# Patient Record
Sex: Female | Born: 1985 | Race: White | Hispanic: No | Marital: Married | Smoking: Former smoker
Health system: Southern US, Community
[De-identification: ages and names within clinical notes are randomized; demographics above are authoritative.]

## PROBLEM LIST (undated history)

## (undated) DIAGNOSIS — R011 Cardiac murmur, unspecified: Secondary | ICD-10-CM

## (undated) DIAGNOSIS — T8859XA Other complications of anesthesia, initial encounter: Secondary | ICD-10-CM

## (undated) DIAGNOSIS — T4145XA Adverse effect of unspecified anesthetic, initial encounter: Secondary | ICD-10-CM

## (undated) DIAGNOSIS — I4891 Unspecified atrial fibrillation: Secondary | ICD-10-CM

## (undated) DIAGNOSIS — I341 Nonrheumatic mitral (valve) prolapse: Secondary | ICD-10-CM

## (undated) DIAGNOSIS — Z5189 Encounter for other specified aftercare: Secondary | ICD-10-CM

## (undated) DIAGNOSIS — I4729 Other ventricular tachycardia: Secondary | ICD-10-CM

## (undated) DIAGNOSIS — G90A Postural orthostatic tachycardia syndrome (POTS): Secondary | ICD-10-CM

## (undated) DIAGNOSIS — Z9889 Other specified postprocedural states: Secondary | ICD-10-CM

## (undated) DIAGNOSIS — I472 Ventricular tachycardia: Secondary | ICD-10-CM

## (undated) DIAGNOSIS — R51 Headache: Secondary | ICD-10-CM

## (undated) DIAGNOSIS — R112 Nausea with vomiting, unspecified: Secondary | ICD-10-CM

## (undated) DIAGNOSIS — G901 Familial dysautonomia [Riley-Day]: Secondary | ICD-10-CM

## (undated) DIAGNOSIS — Z8489 Family history of other specified conditions: Secondary | ICD-10-CM

## (undated) HISTORY — DX: Other ventricular tachycardia: I47.29

## (undated) HISTORY — PX: ABDOMINAL HYSTERECTOMY: SHX81

## (undated) HISTORY — DX: Familial dysautonomia (riley-day): G90.1

## (undated) HISTORY — DX: Nonrheumatic mitral (valve) prolapse: I34.1

## (undated) HISTORY — PX: MYRINGOTOMY: SUR874

## (undated) HISTORY — DX: Ventricular tachycardia: I47.2

---
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
## 2003-01-10 ENCOUNTER — Emergency Department (HOSPITAL_COMMUNITY): Admission: EM | Admit: 2003-01-10 | Discharge: 2003-01-10 | Payer: Self-pay | Admitting: Emergency Medicine

## 2004-09-12 ENCOUNTER — Emergency Department (HOSPITAL_COMMUNITY): Admission: EM | Admit: 2004-09-12 | Discharge: 2004-09-12 | Payer: Self-pay | Admitting: Emergency Medicine

## 2004-10-14 ENCOUNTER — Ambulatory Visit (HOSPITAL_COMMUNITY): Admission: RE | Admit: 2004-10-14 | Discharge: 2004-10-14 | Payer: Self-pay | Admitting: Preventative Medicine

## 2006-07-19 HISTORY — PX: CERVICAL CERCLAGE: SHX1329

## 2006-11-28 ENCOUNTER — Emergency Department (HOSPITAL_COMMUNITY): Admission: EM | Admit: 2006-11-28 | Discharge: 2006-11-28 | Payer: Self-pay | Admitting: Emergency Medicine
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
## 2007-01-26 ENCOUNTER — Ambulatory Visit (HOSPITAL_COMMUNITY): Admission: RE | Admit: 2007-01-26 | Discharge: 2007-01-26 | Payer: Self-pay | Admitting: Obstetrics and Gynecology

## 2007-01-27 ENCOUNTER — Ambulatory Visit (HOSPITAL_COMMUNITY): Admission: RE | Admit: 2007-01-27 | Discharge: 2007-01-27 | Payer: Self-pay | Admitting: Obstetrics and Gynecology

## 2007-02-05 ENCOUNTER — Ambulatory Visit: Payer: Self-pay | Admitting: *Deleted

## 2007-02-05 ENCOUNTER — Inpatient Hospital Stay (HOSPITAL_COMMUNITY): Admission: AD | Admit: 2007-02-05 | Discharge: 2007-02-05 | Payer: Self-pay | Admitting: Obstetrics and Gynecology

## 2007-04-03 ENCOUNTER — Inpatient Hospital Stay (HOSPITAL_COMMUNITY): Admission: AD | Admit: 2007-04-03 | Discharge: 2007-04-03 | Payer: Self-pay | Admitting: Gynecology

## 2007-04-03 ENCOUNTER — Ambulatory Visit: Payer: Self-pay | Admitting: *Deleted

## 2007-04-30 ENCOUNTER — Inpatient Hospital Stay (HOSPITAL_COMMUNITY): Admission: AD | Admit: 2007-04-30 | Discharge: 2007-05-03 | Payer: Self-pay | Admitting: Obstetrics & Gynecology

## 2007-04-30 ENCOUNTER — Ambulatory Visit: Payer: Self-pay | Admitting: *Deleted

## 2008-07-01 ENCOUNTER — Other Ambulatory Visit: Admission: RE | Admit: 2008-07-01 | Discharge: 2008-07-01 | Payer: Self-pay | Admitting: Obstetrics & Gynecology

## 2009-11-04 ENCOUNTER — Other Ambulatory Visit: Admission: RE | Admit: 2009-11-04 | Discharge: 2009-11-04 | Payer: Self-pay | Admitting: Obstetrics & Gynecology

## 2010-09-23 ENCOUNTER — Ambulatory Visit (INDEPENDENT_AMBULATORY_CARE_PROVIDER_SITE_OTHER): Payer: Medicaid Other | Admitting: Cardiovascular Disease

## 2010-09-23 ENCOUNTER — Ambulatory Visit: Payer: Self-pay | Admitting: Cardiovascular Disease

## 2010-09-23 ENCOUNTER — Encounter: Payer: Self-pay | Admitting: Cardiovascular Disease

## 2010-09-23 DIAGNOSIS — I059 Rheumatic mitral valve disease, unspecified: Secondary | ICD-10-CM

## 2010-09-23 DIAGNOSIS — R002 Palpitations: Secondary | ICD-10-CM | POA: Insufficient documentation

## 2010-09-24 ENCOUNTER — Encounter: Payer: Self-pay | Admitting: Cardiovascular Disease

## 2010-09-29 ENCOUNTER — Telehealth: Payer: Self-pay

## 2010-09-29 ENCOUNTER — Ambulatory Visit: Payer: Self-pay | Admitting: Cardiology

## 2010-09-29 NOTE — Letter (Signed)
Summary: dr Janna Arch records  dr Janna Arch records   Imported By: Faythe Ghee 09/24/2010 11:36:39  _____________________________________________________________________  External Attachment:    Type:   Image     Comment:   External Document

## 2010-09-29 NOTE — Assessment & Plan Note (Signed)
Summary: ***NP6 MYTRAL VALUE PROLAPSE/ DONDIEGO REC ON FILE 09/11/10/TMJ   Visit Type:  Initial Consult Primary Provider:  Dr.Dondiego  CC:  mitro valve prolapse per Dondiego.  History of Present Illness: Tara Mahoney is seen today at the request of Dr Janna Arch She has had stypical SSCP, dyspnea and ? MVP since her daughter was born 3 years ago.  Pain is sharp in her chest.  Can be exertinal like going to mail box or at rest.  Reviewed echo report from 08/17/10  Redundant anterior leaflet with mild eccentric MR.  Personally spoke with Dr Janna Arch to get copy of study.  He indicated that pictures were on VHS tape.   No previous history of heart problems. Has a brother who has bad hypertension and " a hole in his heart" She has a 27 yo  at home and is able to keep up with her.  Reviewed ECG from today and it is normal.  No hisotry of chronic lung disease, asthma.  Dont have any recent thyroid studies.    Current Problems (verified): None  Current Medications (verified): 1)  Ortho Evra 150-20 Mcg/24hr Ptwk (Norelgestromin-Eth Estradiol) .... Apply 1 Patch Per 3 Weeks 2)  Metoprolol Tartrate 25 Mg Tabs (Metoprolol Tartrate) .... Take 1 Tab Two Times A Day  Allergies (verified): 1)  ! Vicodin  Family History: Brother with hole in heart and HTN  Social History: Married  one daughter Tobacco Use - Yes.  Alcohol Use - no Regular Exercise - no Drug Use - no  Review of Systems       Denies fever, malais, weight loss, blurry vision, decreased visual acuity, cough, sputum, hemoptysis, pleuritic pain, , heartburn, abdominal pain, melena, lower extremity edema, claudication, or rash.   Vital Signs:  Patient profile:   25 year old female Height:      63 inches Weight:      118 pounds BMI:     20.98 Pulse rate:   77 / minute BP sitting:   114 / 74  (left arm)  Vitals Entered By: Dreama Saa, CNA (September 23, 2010 2:50 PM)  Physical Exam  General:  Affect appropriate Healthy:  appears  stated age HEENT: normal Neck supple with no adenopathy JVP normal no bruits no thyromegaly Lungs clear with no wheezing and good diaphragmatic motion Heart:  S1/S2 no murmur,rub, gallop or click PMI normal Abdomen: benighn, BS positve, no tenderness, no AAA no bruit.  No HSM or HJR Distal pulses intact with no bruits No edema Neuro non-focal Skin warm and dry    Impression & Recommendations:  Problem # 1:  MITRAL VALVE PROLAPSE (ICD-424.0) No murmur on exam Doubt true barlows syndrome.  Have asked for VHS tape to review Her updated medication list for this problem includes:    Metoprolol Tartrate 25 Mg Tabs (Metoprolol tartrate) .Marland Kitchen... Take 1 tab two times a day  Problem # 2:  DYSPNEA (ICD-786.05) Functional no evidence of cardiopulm disease Her updated medication list for this problem includes:    Metoprolol Tartrate 25 Mg Tabs (Metoprolol tartrate) .Marland Kitchen... Take 1 tab two times a day  Problem # 3:  PALPITATIONS (ICD-785.1) Benign.  Can consider event monitor if they worsen.   Her updated medication list for this problem includes:    Metoprolol Tartrate 25 Mg Tabs (Metoprolol tartrate) .Marland Kitchen... Take 1 tab two times a day   EKG Report  Procedure date:  09/23/2010  Findings:      NSR 69 Normal ECG

## 2010-10-02 ENCOUNTER — Encounter: Payer: Self-pay | Admitting: Adult Health

## 2010-10-02 ENCOUNTER — Ambulatory Visit (INDEPENDENT_AMBULATORY_CARE_PROVIDER_SITE_OTHER): Payer: Medicaid Other | Admitting: Adult Health

## 2010-10-02 DIAGNOSIS — R0989 Other specified symptoms and signs involving the circulatory and respiratory systems: Secondary | ICD-10-CM

## 2010-10-02 DIAGNOSIS — R002 Palpitations: Secondary | ICD-10-CM

## 2010-10-05 ENCOUNTER — Encounter: Payer: Self-pay | Admitting: Cardiovascular Disease

## 2010-10-06 NOTE — Letter (Signed)
Summary: Stress Echocardiogram Information Sheet  [facility]  [address], [place] [address]   Phone: [phone]  Fax: [phone]      October 02, 2010 MRN: [id number] light prior to the test.   Corliss Marcus  Doctor: Appointment Date: Appointment Time: Appointment Location: [facility]  Stress Echocardiogram Information Sheet    Instructions:   1. DO NOT  take your Metoprolol the day before or morning of test.  2. Do not eat or drink after midnight the night before test.  3. Dress prepared to exercise.  4. DO NOT use ANY caffine or tobacco products 3 hours before appointment.  5. Report to the Short Stay Center on the1st floor.  6. Please bring all current prescription medications.  7. If you have any questions, please call [phone]

## 2010-10-06 NOTE — Progress Notes (Addendum)
**Note De-Identified Tara Mahoney Obfuscation** Summary: Echo pictures  Phone Note Call from Patient   Caller: Patient Reason for Call: Talk to Nurse Summary of Call: patient wanting to know if we received "pictures" from DonDiego of Echo / we have not received pictures / patient wanted to know "if there was anything wrong with her" / tg Initial call taken by: [name] [name],  September 29, 2010 1:38 PM  Follow-up for Phone Call        Pt. advised that we are waiting on pictures from her recent Echo from [name] office and we will contact her once [name] reviews pictures.  Follow-up by: [name] LPN,  September 29, 2010 2:09 PM  Additional Follow-up for Phone Call Additional follow up Details #1::        Pt. states that after talking with me, she called [name] office to request that pictures be sent to [place] and was told that her pictures were on a tape with a lot of other patient's Echo results and that it would be too difficult to just get her pictures without disturbing the others. Per pt. request, f/u appt. scheduled with [name], NP on 3-16. Additional Follow-up by: [name] LPN,  September 29, 2010 2:32 PM     Appended Document: Echo pictures She can have a F/U echo in a year.  No murmur on exam  Doubt MVP

## 2010-10-06 NOTE — Progress Notes (Signed)
**Note De-Identified Esparanza Krider Obfuscation** Summary: "pictures" from echo  Phone Note Outgoing Call   Call placed by: [name] Galaxy Borden LPN,  September 29, 2010 2:11 PM Summary of Call: Call placed to [name] office with second request for pictures of pt's Echo.  Initial call taken by: [name] Chamar Broughton LPN,  September 29, 2010 2:11 PM

## 2010-10-06 NOTE — Assessment & Plan Note (Signed)
Summary: per [name] for f/u/tg   Visit Type:  Follow-up Primary Provider:  Dr.Dondiego  CC:  .Marland Kitchen  History of Present Illness: Tara Mahoney is a very pleasant 25 y/o CF we are seeing on follow up after originally seen by [name] one week ago because of abnormal echo done by Dr. Janna Arch in his office demonstrating a severe mitral valve prolapse.  We requested the images, but still have not received them.  Tara Mahoney continues to have DOE, palpatations and occasional sharp chest discomfort which is fleeting located under her left breast. She has become somewhat anxious about this and was to talk to [name] about the echo results on this visit.  Unfortunately, [name] is not available today.  On last visit, [name] discussed the need to repeat the echo if the images could not be obtained.  Current Medications (verified): 1)  Ortho Evra 150-20 Mcg/24hr Ptwk (Norelgestromin-Eth Estradiol) .... Apply 1 Patch Per 3 Weeks 2)  Metoprolol Tartrate 25 Mg Tabs (Metoprolol Tartrate) .... Take 1 Tab Two Times A Day  Allergies (verified): 1)  ! Vicodin  Comments:  Nurse/Medical Assistant: patient states meds are the same pharmacy is [place] [facility]  Past History:  Past medical, surgical, family and social histories (including risk factors) reviewed, and no changes noted (except as noted below).  Past Medical History: Reviewed history from 09/22/2010 and no changes required. palpatations mitro valve prolapse  Past Surgical History: Reviewed history from 09/22/2010 and no changes required. childbirth  Family History: Reviewed history from 09/23/2010 and no changes required. Brother with hole in heart and HTN  Social History: Reviewed history from 09/23/2010 and no changes required. Married  one daughter Tobacco Use - Yes.  Alcohol Use - no Regular Exercise - no Drug Use - no  Review of Systems       All other systems have been reviewed and are negative unless stated  above.   Vital Signs:  Patient profile:   25 year old female Weight:      118 pounds BMI:     20.98 Pulse rate:   67 / minute BP sitting:   109 / 69  (left arm)  Vitals Entered By: Dreama Saa, CNA (October 02, 2010 11:56 AM)  Physical Exam  General:  Well developed, well nourished, in no acute distress. Lungs:  Clear bilaterally to auscultation and percussion. Heart:  Non-displaced PMI, chest non-tender; regular rate and rhythm, S1, S2 without murmurs, rubs or gallops. Carotid upstroke normal, no bruit. Normal abdominal aortic size, no bruits. Femorals normal pulses, no bruits. Pedals normal pulses. No edema, no varicosities. Abdomen:  Bowel sounds positive; abdomen soft and non-tender without masses, organomegaly, or hernias noted. No hepatosplenomegaly. Msk:  Back normal, normal gait. Muscle strength and tone normal. Pulses:  pulses normal in all 4 extremities Extremities:  No clubbing or cyanosis. Neurologic:  Alert and oriented x 3. Psych:  Normal affect.   Impression & Recommendations:  Problem # 1:  MITRAL VALVE PROLAPSE (ICD-424.0) We have no clear objective findings for this.  Auscultation did not prove to be positive for any mitral murmur.  She continues symptomatic.  I will schedule a stress echo to evaluate her.  She will follow-up with test results. In the interim she is to continue her metoprolol.  Once we have definitive answers we will make further recommendations. Her updated medication list for this problem includes:    Metoprolol Tartrate 25 Mg Tabs (Metoprolol tartrate) .Marland Kitchen... Take 1 tab two times  a day  Orders: Stress Echo (Stress Echo)  Problem # 2:  PALPITATIONS (ICD-785.1) Can consider cardionet monitor at some point. Her updated medication list for this problem includes:    Metoprolol Tartrate 25 Mg Tabs (Metoprolol tartrate) .Marland Kitchen... Take 1 tab two times a day  Orders: Stress Echo (Stress Echo)  Patient Instructions: 1)  Your physician recommends  that you schedule a follow-up appointment in: after test 2)  Your physician recommends that you continue on your current medications as directed. Please refer to the Current Medication list given to you today. 3)  Your physician has requested that you have an exercise stress myoview.  For further information please visit [email or web address]  Please follow instruction sheet, as given.

## 2010-10-14 ENCOUNTER — Ambulatory Visit (HOSPITAL_COMMUNITY): Payer: Medicaid Other | Attending: Cardiology

## 2010-10-14 ENCOUNTER — Ambulatory Visit (HOSPITAL_COMMUNITY)
Admission: RE | Admit: 2010-10-14 | Discharge: 2010-10-14 | Disposition: A | Discharge status: Home / Self Care | Payer: Medicaid Other | Source: Ambulatory Visit | Attending: Cardiology | Admitting: Cardiology

## 2010-10-14 DIAGNOSIS — R002 Palpitations: Secondary | ICD-10-CM | POA: Insufficient documentation

## 2010-10-14 DIAGNOSIS — I059 Rheumatic mitral valve disease, unspecified: Secondary | ICD-10-CM

## 2010-10-15 NOTE — Letter (Signed)
Summary: DR [facility] RECORDS  DR [facility] RECORDS   Imported By: Faythe Ghee 10/05/2010 10:31:50  _____________________________________________________________________  External Attachment:    Type:   Image     Comment:   External Document

## 2010-10-16 ENCOUNTER — Encounter (INDEPENDENT_AMBULATORY_CARE_PROVIDER_SITE_OTHER): Payer: Medicaid Other | Admitting: Adult Health

## 2010-10-16 ENCOUNTER — Encounter: Payer: Self-pay | Admitting: Adult Health

## 2010-10-19 ENCOUNTER — Ambulatory Visit (HOSPITAL_COMMUNITY)
Admission: RE | Admit: 2010-10-19 | Discharge: 2010-10-19 | Disposition: A | Discharge status: Home / Self Care | Payer: Medicaid Other | Source: Ambulatory Visit | Attending: Cardiology | Admitting: Cardiology

## 2010-10-19 DIAGNOSIS — R002 Palpitations: Secondary | ICD-10-CM | POA: Insufficient documentation

## 2010-10-19 DIAGNOSIS — I059 Rheumatic mitral valve disease, unspecified: Secondary | ICD-10-CM

## 2010-12-01 NOTE — Op Note (Signed)
NAME:  Tara Mahoney, Tara Mahoney               ACCOUNT NO.:  [id number]   MEDICAL RECORD NO.:  [id number]          PATIENT TYPE:  AMB   LOCATION:  DAY                           FACILITY:  [facility]   PHYSICIAN:  Tilda Burrow, M.D. DATE OF BIRTH:  [birth year]   DATE OF PROCEDURE:  DATE OF DISCHARGE:                               OPERATIVE REPORT   PREOPERATIVE DIAGNOSIS:  Pregnancy at 23-1/2 weeks, short cervical  length, suspect cervical incompetency.   POSTOPERATIVE DIAGNOSIS:  Pregnancy at 23-1/2 weeks, short cervical  length, suspect cervical incompetency, possible cervicitis.   OPERATION PERFORMED:  McDonald's cerclage placement with Prolene  sutures.   SURGEON:  Tilda Burrow, M.D.   ASSISTANT:  None.   ANESTHESIA:  Spinal.   COMPLICATIONS:  Incomplete analgesic relief where part of the cervix was  uncomfortable during stitch placement.   DESCRIPTION OF PROCEDURE:  The patient was taken to the operating room,  prepped and draped for vaginal procedure.  The spinal introduced by Dr.  Marina Goodell was allowed to settle down to the sacral dermatome and she was  allowed to sit for four minutes.  We then placed her in supine position  and she was able to lift her legs but had anal incontinence.  Speculum  was inserted and the cervix visualized.  Cervix could be carefully  grasped with an Allis clamp while a weighted speculum held the introitus  open and a lateral Deaver retractor allowed visualization of the cervix.  0-0 Prolene suture was used in a circumferential fashion at the base of  a fairly long appearing cervix where it entered into the vaginal apex.  The suture was placed carefully and very superficially trying to stay as  close as we could to the vaginal apex itself.  This was placed in  circumferential fashion and was complicated only by the fact that the  cervix bled vigorously if you touched it.  The cervix appeared to have  some irritation of the everted portions of the cervix  which were quite  large, and it was also complicated by the fact that the patient felt the  stitch being placed from 9 o'clock to 12 o'clock as we worked our way  clockwise around the cervix.  Suture was tied down with good firm  support of the cervix so that the cervix was less than 1 cm in diameter  at that point.  The cervix internal os was less than 1 cm at that point.  The suture was tied down.  At no time during the procedure was there any  question  of membrane rupture or damage to the pregnancy.  Postprocedure the  patient went to recovery room, fetal heart tones were normal in the  recovery room.  She will go home on Metrogel and a week's worth of  terbutaline.  Follow-up in our office.  Blood type is Rh positive.      Tilda Burrow, M.D.  Electronically Signed     JVF/MEDQ  D:  01/30/2007  T:  01/31/2007  Job:  [id number]

## 2010-12-01 NOTE — H&P (Signed)
NAME:  Tara Mahoney, Tara Mahoney               ACCOUNT NO.:  [id number]   MEDICAL RECORD NO.:  [id number]          PATIENT TYPE:  AMB   LOCATION:  DAY                           FACILITY:  [facility]   PHYSICIAN:  Tilda Burrow, M.D. DATE OF BIRTH:  21-Aug-1985   DATE OF ADMISSION:  DATE OF DISCHARGE:  LH                              HISTORY & PHYSICAL   ADMISSION DIAGNOSIS:  Pregnancy at 23-1/[redacted] weeks gestation, short  cervical length, suspected cervical incompetency.   HISTORY OF PRESENT ILLNESS:  This 25 year old female, gravida 1, para 0,  abortus 0 has been seen in our office twice this week for complaints of  lower abdominal pressure and discomfort.  She had what felt like a  bulging lower uterine segment and has been sent over for ultrasound  which shows cervical length of 2.1 cm, possibly less.  On transvaginal  ultrasound she has had a fetal fibronectin which was negative.  But  after counseling of the risks of McDonald cerclage versus concerns over  cervical length, the patient decided to proceed with late rescue  cerclage placement.  Risks of the procedure including membrane rupture  have been reviewed with the patient.  She understands that there are  indeed technical risks of membrane rupture associated with the case.  Husband Tara Mahoney was with her for this discussion.   PAST MEDICAL HISTORY:  Benign.   PAST SURGICAL HISTORY:  Foot surgery for Morton's neuroma in the past.  Grommets were placed in the ears as a child.   ALLERGIES:  CODEINE causing nausea.   SOCIAL HISTORY:  Cigarettes, alcohol, and recreational drugs denied.   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   PHYSICAL EXAMINATION:  GENERAL:  A petite, slim-built, Caucasian female  gravida 1, para 0.  VITAL SIGNS:  Weight 127, blood pressure 112/58.  HEENT:  Pupils equal, round, and reactive to light.  NECK:  Supple.  CHEST:  Clear to auscultation.  ABDOMEN:  Nontender.  Fundal height consistent with [redacted] weeks  gestation,  147 beats per minute heart rate.  PELVIC:  Cervix closed visually, spots with cervical contact with  speculum.  Midposition, but visually short.  Digital examination  deferred at this time based on yesterday's ultrasound.  Blood type is A  positive.   LABORATORY DATA:  Hemoglobin 14, hematocrit 40, hepatitis, HIV, RPR, GC,  and chlamydia all negative.  Pap smear normal.  Past gonorrhea and  Chlamydia cultures have been performed and are negative.   PLAN:  McDonald cerclage this p.m.      Tilda Burrow, M.D.  Electronically Signed     JVF/MEDQ  D:  01/27/2007  T:  01/27/2007  Job:  [id number]

## 2010-12-11 ENCOUNTER — Other Ambulatory Visit (HOSPITAL_COMMUNITY)
Admission: RE | Admit: 2010-12-11 | Discharge: 2010-12-11 | Disposition: A | Discharge status: Home / Self Care | Payer: Medicaid Other | Source: Ambulatory Visit | Attending: Obstetrics & Gynecology | Admitting: Obstetrics & Gynecology

## 2010-12-11 ENCOUNTER — Other Ambulatory Visit: Payer: Self-pay | Admitting: Obstetrics & Gynecology

## 2010-12-11 DIAGNOSIS — Z01419 Encounter for gynecological examination (general) (routine) without abnormal findings: Secondary | ICD-10-CM | POA: Insufficient documentation

## 2011-04-29 LAB — CBC
Platelets: 120 — ABNORMAL LOW
Platelets: 122 — ABNORMAL LOW
RDW: 14.5 — ABNORMAL HIGH
RDW: 14.5 — ABNORMAL HIGH
WBC: 12.9 — ABNORMAL HIGH

## 2011-04-29 LAB — WET PREP, GENITAL
Clue Cells Wet Prep HPF POC: NONE SEEN
Clue Cells Wet Prep HPF POC: NONE SEEN
Trich, Wet Prep: NONE SEEN
Yeast Wet Prep HPF POC: NONE SEEN
Yeast Wet Prep HPF POC: NONE SEEN

## 2011-04-29 LAB — STREP B DNA PROBE

## 2011-04-29 LAB — HEMOGLOBIN AND HEMATOCRIT, BLOOD
HCT: 37.7
Hemoglobin: 13.4

## 2011-05-03 LAB — URINALYSIS, ROUTINE W REFLEX MICROSCOPIC
Ketones, ur: 15 — AB
Nitrite: NEGATIVE
Specific Gravity, Urine: 1.02
pH: 6.5

## 2011-05-03 LAB — WET PREP, GENITAL: Clue Cells Wet Prep HPF POC: NONE SEEN

## 2011-05-03 LAB — URINE MICROSCOPIC-ADD ON

## 2011-05-03 LAB — GC/CHLAMYDIA PROBE AMP, GENITAL: Chlamydia, DNA Probe: NEGATIVE

## 2011-05-04 LAB — CBC
Hemoglobin: 12.7
Platelets: 140 — ABNORMAL LOW
RDW: 14.4 — ABNORMAL HIGH

## 2011-05-04 LAB — PROTIME-INR: INR: 0.9

## 2011-05-04 LAB — APTT: aPTT: 25

## 2011-07-06 LAB — OB RESULTS CONSOLE GC/CHLAMYDIA: Chlamydia: NEGATIVE

## 2011-07-06 LAB — OB RESULTS CONSOLE ABO/RH

## 2011-07-06 LAB — OB RESULTS CONSOLE RPR: RPR: NONREACTIVE

## 2011-07-06 LAB — OB RESULTS CONSOLE ANTIBODY SCREEN: Antibody Screen: NEGATIVE

## 2011-07-06 LAB — OB RESULTS CONSOLE HEPATITIS B SURFACE ANTIGEN: Hepatitis B Surface Ag: NEGATIVE

## 2011-07-19 ENCOUNTER — Encounter: Payer: Self-pay | Admitting: Adult Health

## 2011-08-26 ENCOUNTER — Other Ambulatory Visit: Payer: Self-pay | Admitting: Obstetrics & Gynecology

## 2011-09-02 ENCOUNTER — Encounter (HOSPITAL_COMMUNITY)
Admission: RE | Admit: 2011-09-02 | Discharge: 2011-09-02 | Disposition: A | Discharge status: Home / Self Care | Payer: Medicaid Other | Source: Ambulatory Visit | Attending: Obstetrics & Gynecology | Admitting: Obstetrics & Gynecology

## 2011-09-02 ENCOUNTER — Encounter (HOSPITAL_COMMUNITY): Payer: Self-pay

## 2011-09-02 ENCOUNTER — Other Ambulatory Visit: Payer: Self-pay

## 2011-09-02 HISTORY — DX: Adverse effect of unspecified anesthetic, initial encounter: T41.45XA

## 2011-09-02 HISTORY — DX: Other complications of anesthesia, initial encounter: T88.59XA

## 2011-09-02 HISTORY — DX: Headache: R51

## 2011-09-02 LAB — COMPREHENSIVE METABOLIC PANEL
CO2: 29 mEq/L (ref 19–32)
Calcium: 11 mg/dL — ABNORMAL HIGH (ref 8.4–10.5)
Chloride: 102 mEq/L (ref 96–112)
Creatinine, Ser: 0.7 mg/dL (ref 0.50–1.10)
GFR calc Af Amer: >90 mL/min (ref >90)
GFR calc non Af Amer: >90 mL/min (ref >90)
Glucose, Bld: 80 mg/dL (ref 70–99)
Total Bilirubin: 0.3 mg/dL (ref 0.3–1.2)

## 2011-09-02 LAB — CBC
HCT: 33 % — ABNORMAL LOW (ref 36.0–46.0)
Hemoglobin: 11.9 g/dL — ABNORMAL LOW (ref 12.0–15.0)
MCH: 31.6 pg (ref 26.0–34.0)
MCV: 87.5 fL (ref 78.0–100.0)
RBC: 3.77 MIL/uL — ABNORMAL LOW (ref 3.87–5.11)

## 2011-09-02 LAB — SURGICAL PCR SCREEN: Staphylococcus aureus: POSITIVE — AB

## 2011-09-02 NOTE — Patient Instructions (Addendum)
[name]  09/02/2011   Your procedure is scheduled on:  Wednesday, 09/08/11  [facility] to [facility] [place] AM.  Call this number if you have problems the morning of surgery: [phone]   Remember:   Do not eat food:After Midnight.  May have clear liquids:until Midnight .  Clear liquids include soda, tea, black coffee, apple or grape juice, broth.  Take these medicines the morning of surgery with A SIP OF WATER: metoprolol   Do not wear jewelry, make-up or nail polish.  Do not wear lotions, powders, or perfumes. You may wear deodorant.  Do not shave 48 hours prior to surgery.  Do not bring valuables to the hospital.  Contacts, dentures or bridgework may not be worn into surgery.  Leave suitcase in the car. After surgery it may be brought to your room.  For patients admitted to the hospital, checkout time is 11:00 AM the day of discharge.   Patients discharged the day of surgery will not be allowed to drive home.  Name and phone number of your driver:driver  Special Instructions: CHG Shower Use Special Wash: 1/2 bottle night before surgery and 1/2 bottle morning of surgery.   Please read over the following fact sheets that you were given: Pain Booklet, MRSA Information, Surgical Site Infection Prevention, Anesthesia Post-op Instructions and Care and Recovery After Surgery    Cerclage of the Cervix Cerclage of the cervix is a surgical procedure for an incompetent cervix. An incompetent cervix is a weak cervix that opens up before labor begins. Cerclage of the cervix sews the cervix closed during pregnancy.  LET YOUR CAREGIVER KNOW ABOUT:   Allergies to foods or medications.   All over-the-counter, prescription, herbal, eye drops and cream medications you are using.   Taking illegal drugs or drinking an excessive amount of alcohol.   Any recent colds or infections.   Past problems with anesthetics or novocaine.   Past surgery.   History of blood clots or abnormal  bleeding problems.   Other medical or health problems.  RISKS AND COMPLICATIONS   Infection.   Bleeding.   Rupturing the amniotic sac (membranes).   Going into early labor and delivery.   Problems with the anesthesia.   Infection of the amniotic sac.  BEFORE THE PROCEDURE   Do not take aspirin.   Do not eat or drink anything 8 hours before the procedure.   Do not smoke.   If you are being admitted the same day as the procedure, arrive at the hospital at least 60 minutes before the surgery or as directed. During this time, you will sign the necessary forms and get prepared for the surgery.   A waiting area is available for family and friends.  PROCEDURE   You will be given an IV (intravenous) and medication to relax you.   You will be put to sleep with a general anesthetic.   A stitch will be placed in and around the cervix to tighten it and keep it closed.  AFTER THE PROCEDURE   You will go to a recovery room where you and the baby are monitored.   Once you are awake, stable, and taking fluids well, barring other problems, you will be allowed to return to your room.   You will usually stay in the hospital overnight.   You may get an injection of progesterone to prevent uterine contractions.   Have someone drive you home and stay with you for a day or two.  You may be given medications to take when you go home.  HOME CARE INSTRUCTIONS   Only take over-the-counter or prescriptions medicines for pain, discomfort or fever as directed by your caregiver.   Avoid physical activities and exercise until your caregiver says it is okay.   Resume your usual diet.   Do not douche.   Do not have sexual intercourse until your caregiver tells you it is OK.   Keep your follow up surgical and prenatal appointments with your caregiver.  SEEK MEDICAL CARE IF:   You have abnormal vaginal discharge.   You develop a rash.   You are having problems with your medications.     You become lightheaded or feel faint.  SEEK IMMEDIATE MEDICAL CARE IF:   You develop vaginal bleeding.   You are leaking fluid or have a gush of fluid from the vagina.   You develop a temperature of 102 F (38.9 C) or higher.   You pass out.   You have uterine contractions.   You feel the baby is not moving as much as usual or cannot feel the baby move.  Document Released: 06/17/2008 Document Revised: 03/17/2011 Document Reviewed: 06/17/2008 [facility] Patient Information 2012 [place], [place].

## 2011-09-07 NOTE — H&P (Signed)
Tara Mahoney is an 26 y.o. female with EDD 02/19/12 by 7 week 2 day sonogram now at16 weeks gestation for prophylactic cerclage placement.  She had significant cervical shortening with her last pregnancy an had cerclage placed as result.  Eventually she delivered shortly after it was removed with the last pregnancy at 36 weeks.     Past Medical History  Diagnosis Date  . MVP (mitral valve prolapse)   . Palpitations   . Complication of anesthesia   . Hyperlipidemia   . Headache     Past Surgical History  Procedure Date  . Cervical cerclage 2008  . Myringotomy     Family History  Problem Relation Age of Onset  . Hypertension Brother   . Heart disease Brother     hole in heart  . Anesthesia problems Neg Hx   . Hypotension Neg Hx   . Pseudochol deficiency Neg Hx   . Malignant hyperthermia Neg Hx     Social History:  reports that she quit smoking about 2 years ago. She has never used smokeless tobacco. She reports that she does not drink alcohol or use illicit drugs.  Allergies:  Allergies  Allergen Reactions  . Hydrocodone-Acetaminophen Itching and Nausea And Vomiting    No prescriptions prior to admission    ROS  Review of Systems  Constitutional: Negative for fever, chills, weight loss, malaise/fatigue and diaphoresis.  HENT: Negative for hearing loss, ear pain, nosebleeds, congestion, sore throat, neck pain, tinnitus and ear discharge.   Eyes: Negative for blurred vision, double vision, photophobia, pain, discharge and redness.  Respiratory: Negative for cough, hemoptysis, sputum production, shortness of breath, wheezing and stridor.   Cardiovascular: Negative for chest pain, palpitations, orthopnea, claudication, leg swelling and PND.  Gastrointestinal: Negative for abdominal pain. Negative for heartburn, nausea, vomiting, diarrhea, constipation, blood in stool and melena.  Genitourinary: Negative for dysuria, urgency, frequency, hematuria and flank pain.    Musculoskeletal: Negative for myalgias, back pain, joint pain and falls.  Skin: Negative for itching and rash.  Neurological: Negative for dizziness, tingling, tremors, sensory change, speech change, focal weakness, seizures, loss of consciousness, weakness and headaches.  Endo/Heme/Allergies: Negative for environmental allergies and polydipsia. Does not bruise/bleed easily.  Psychiatric/Behavioral: Negative for depression, suicidal ideas, hallucinations, memory loss and substance abuse. The patient is not nervous/anxious and does not have insomnia.       Physical Exam Physical Exam  Vitals reviewed. Constitutional: She is oriented to person, place, and time. She appears well-developed and well-nourished.  HENT:  Head: Normocephalic and atraumatic.  Right Ear: External ear normal.  Left Ear: External ear normal.  Nose: Nose normal.  Mouth/Throat: Oropharynx is clear and moist.  Eyes: Conjunctivae and EOM are normal. Pupils are equal, round, and reactive to light. Right eye exhibits no discharge. Left eye exhibits no discharge. No scleral icterus.  Neck: Normal range of motion. Neck supple. No tracheal deviation present. No thyromegaly present.  Cardiovascular: Normal rate, regular rhythm, normal heart sounds and intact distal pulses.  Exam reveals no gallop and no friction rub.   No murmur heard. Respiratory: Effort normal and breath sounds normal. No respiratory distress. She has no wheezes. She has no rales. She exhibits no tenderness.  GI: Soft. Bowel sounds are normal. She exhibits no distension and no mass. There is no tenderness. There is no rebound and no guarding.  Genitourinary:       Vulva is normal without lesions Vagina is pink moist without discharge Cervix normal in  appearance and pap is normal, firm not patulous Uterus is enlarged to 16 weeks size Adnexa is negative with normal sized ovaries by sonogram  Musculoskeletal: Normal range of motion. She exhibits no edema  and no tenderness.  Neurological: She is alert and oriented to person, place, and time. She has normal reflexes. She displays normal reflexes. No cranial nerve deficit. She exhibits normal muscle tone. Coordination normal.  Skin: Skin is warm and dry. No rash noted. No erythema. No pallor.  Psychiatric: She has a normal mood and affect. Her behavior is normal. Judgment and thought content normal.    GBS negative in office Recent Results (from the past 336 hour(s))  CBC   Collection Time   09/02/11 12:55 PM      Component Value Range   WBC 7.2  4.0 - 10.5 (K/uL)   RBC 3.77 (*) 3.87 - 5.11 (MIL/uL)   Hemoglobin 11.9 (*) 12.0 - 15.0 (g/dL)   HCT 16.1 (*) 09.6 - 46.0 (%)   MCV 87.5  78.0 - 100.0 (fL)   MCH 31.6  26.0 - 34.0 (pg)   MCHC 36.1 (*) 30.0 - 36.0 (g/dL)   RDW 04.5  40.9 - 81.1 (%)   Platelets 120 (*) 150 - 400 (K/uL)  COMPREHENSIVE METABOLIC PANEL   Collection Time   09/02/11 12:55 PM      Component Value Range   Sodium 139  135 - 145 (mEq/L)   Potassium 4.0  3.5 - 5.1 (mEq/L)   Chloride 102  96 - 112 (mEq/L)   CO2 29  19 - 32 (mEq/L)   Glucose, Bld 80  70 - 99 (mg/dL)   BUN 9  6 - 23 (mg/dL)   Creatinine, Ser 9.14  0.50 - 1.10 (mg/dL)   Calcium 78.2 (*) 8.4 - 10.5 (mg/dL)   Total Protein 6.4  6.0 - 8.3 (g/dL)   Albumin 3.3 (*) 3.5 - 5.2 (g/dL)   AST 15  0 - 37 (U/L)   ALT 10  0 - 35 (U/L)   Alkaline Phosphatase 43  39 - 117 (U/L)   Total Bilirubin 0.3  0.3 - 1.2 (mg/dL)   GFR calc non Af Amer >90  >90 (mL/min)   GFR calc Af Amer >90  >90 (mL/min)  SURGICAL PCR SCREEN   Collection Time   09/02/11  1:00 PM      Component Value Range   MRSA, PCR NEGATIVE  NEGATIVE    Staphylococcus aureus POSITIVE (*) NEGATIVE        Assessment/Plan: 1.  [redacted] weeks gestation 2.  Prophylactic cerclage placement due to dramatic cervical shortening in the 2nd trimester with her last pregnancy  Patient understands th risks and benefits of cerclage placement including preterm labor  and PROM.  She will proceed.  Kerby Hockley H 09/07/2011, 2:46 PM

## 2011-09-08 ENCOUNTER — Encounter (HOSPITAL_COMMUNITY): Payer: Self-pay | Admitting: Anesthesiology

## 2011-09-08 ENCOUNTER — Ambulatory Visit (HOSPITAL_COMMUNITY)
Admission: RE | Admit: 2011-09-08 | Discharge: 2011-09-08 | Disposition: A | Discharge status: Home / Self Care | Payer: Medicaid Other | Source: Ambulatory Visit | Attending: Obstetrics & Gynecology | Admitting: Obstetrics & Gynecology

## 2011-09-08 ENCOUNTER — Encounter (HOSPITAL_COMMUNITY)
Admission: RE | Disposition: A | Discharge status: Home / Self Care | Payer: Self-pay | Source: Ambulatory Visit | Attending: Obstetrics & Gynecology

## 2011-09-08 ENCOUNTER — Encounter (HOSPITAL_COMMUNITY): Payer: Self-pay | Admitting: *Deleted

## 2011-09-08 ENCOUNTER — Ambulatory Visit (HOSPITAL_COMMUNITY): Payer: Medicaid Other | Admitting: Anesthesiology

## 2011-09-08 DIAGNOSIS — O09219 Supervision of pregnancy with history of pre-term labor, unspecified trimester: Secondary | ICD-10-CM | POA: Insufficient documentation

## 2011-09-08 DIAGNOSIS — N883 Incompetence of cervix uteri: Secondary | ICD-10-CM | POA: Insufficient documentation

## 2011-09-08 HISTORY — PX: CERVICAL CERCLAGE: SHX1329

## 2011-09-08 HISTORY — DX: Cardiac murmur, unspecified: R01.1

## 2011-09-08 SURGERY — CERCLAGE, CERVIX, VAGINAL APPROACH
Anesthesia: Spinal | Site: Cervix | Wound class: Clean Contaminated

## 2011-09-08 MED ORDER — CEFAZOLIN SODIUM 1-5 GM-% IV SOLN
INTRAVENOUS | Status: AC
  Filled 2011-09-08: qty 50

## 2011-09-08 MED ORDER — FENTANYL CITRATE 0.05 MG/ML IJ SOLN
INTRAMUSCULAR | Status: AC
  Administered 2011-09-08: 25 ug via INTRAVENOUS
  Filled 2011-09-08: qty 2

## 2011-09-08 MED ORDER — LACTATED RINGERS IV SOLN
INTRAVENOUS | Status: DC
  Administered 2011-09-08: 1000 mL via INTRAVENOUS

## 2011-09-08 MED ORDER — MIDAZOLAM HCL 2 MG/2ML IJ SOLN
INTRAMUSCULAR | Status: AC
  Administered 2011-09-08: 2 mg via INTRAVENOUS
  Filled 2011-09-08: qty 2

## 2011-09-08 MED ORDER — FENTANYL CITRATE 0.05 MG/ML IJ SOLN
25.0000 ug | INTRAMUSCULAR | Status: DC | PRN
  Administered 2011-09-08: 25 ug via INTRAVENOUS
  Administered 2011-09-08: 50 ug via INTRAVENOUS
  Administered 2011-09-08: 25 ug via INTRAVENOUS

## 2011-09-08 MED ORDER — 0.9 % SODIUM CHLORIDE (POUR BTL) OPTIME
TOPICAL | Status: DC | PRN
  Administered 2011-09-08: 1000 mL

## 2011-09-08 MED ORDER — INDOMETHACIN 50 MG RE SUPP: 50.0000 mg | Freq: Once | RECTAL | Status: DC

## 2011-09-08 MED ORDER — MIDAZOLAM HCL 2 MG/2ML IJ SOLN
INTRAMUSCULAR | Status: AC
  Filled 2011-09-08: qty 2

## 2011-09-08 MED ORDER — INDOMETHACIN 50 MG PO CAPS
50.0000 mg | ORAL_CAPSULE | Freq: Once | ORAL | Status: AC
  Administered 2011-09-08: 50 mg via ORAL
  Filled 2011-09-08: qty 1

## 2011-09-08 MED ORDER — ONDANSETRON HCL 4 MG/2ML IJ SOLN
4.0000 mg | Freq: Once | INTRAMUSCULAR | Status: AC | PRN
  Administered 2011-09-08: 4 mg via INTRAVENOUS

## 2011-09-08 MED ORDER — OXYCODONE HCL 5 MG PO TABS: 5.0000 mg | ORAL_TABLET | ORAL | Status: AC | PRN

## 2011-09-08 MED ORDER — MIDAZOLAM HCL 2 MG/2ML IJ SOLN
1.0000 mg | INTRAMUSCULAR | Status: DC | PRN
  Administered 2011-09-08 (×2): 2 mg via INTRAVENOUS

## 2011-09-08 MED ORDER — ONDANSETRON HCL 4 MG/2ML IJ SOLN
INTRAMUSCULAR | Status: AC
  Administered 2011-09-08: 4 mg via INTRAVENOUS
  Filled 2011-09-08: qty 2

## 2011-09-08 MED ORDER — FERRIC SUBSULFATE 259 MG/GM EX SOLN
CUTANEOUS | Status: AC
  Filled 2011-09-08: qty 8

## 2011-09-08 MED ORDER — BUPIVACAINE IN DEXTROSE 0.75-8.25 % IT SOLN
INTRATHECAL | Status: DC | PRN
  Administered 2011-09-08: 11.25 mg via INTRATHECAL

## 2011-09-08 MED ORDER — FERRIC SUBSULFATE 259 MG/GM EX SOLN
CUTANEOUS | Status: DC | PRN
  Administered 2011-09-08: 1

## 2011-09-08 MED ORDER — CEFAZOLIN SODIUM 1-5 GM-% IV SOLN
1.0000 g | INTRAVENOUS | Status: AC
  Administered 2011-09-08: 1 g via INTRAVENOUS

## 2011-09-08 MED ORDER — BUPIVACAINE IN DEXTROSE 0.75-8.25 % IT SOLN
INTRATHECAL | Status: AC
  Filled 2011-09-08: qty 2

## 2011-09-08 MED ORDER — ONDANSETRON HCL 8 MG PO TABS: 8.0000 mg | ORAL_TABLET | Freq: Three times a day (TID) | ORAL | Status: AC | PRN

## 2011-09-08 MED ORDER — ONDANSETRON HCL 4 MG/2ML IJ SOLN
4.0000 mg | Freq: Once | INTRAMUSCULAR | Status: AC
  Administered 2011-09-08: 4 mg via INTRAVENOUS

## 2011-09-08 MED ORDER — ONDANSETRON HCL 4 MG/2ML IJ SOLN
INTRAMUSCULAR | Status: AC
  Filled 2011-09-08: qty 2

## 2011-09-08 SURGICAL SUPPLY — 24 items
BAG HAMPER (MISCELLANEOUS) ×2 IMPLANT
CLOTH BEACON ORANGE TIMEOUT ST (SAFETY) ×2 IMPLANT
COVER LIGHT HANDLE STERIS (MISCELLANEOUS) ×4 IMPLANT
GAUZE SPONGE 4X4 16PLY XRAY LF (GAUZE/BANDAGES/DRESSINGS) ×2 IMPLANT
GLOVE BIOGEL PI IND STRL 7.0 (GLOVE) ×2 IMPLANT
GLOVE BIOGEL PI IND STRL 8 (GLOVE) ×2 IMPLANT
GLOVE BIOGEL PI INDICATOR 7.0 (GLOVE) ×2
GLOVE BIOGEL PI INDICATOR 8 (GLOVE) ×2
GLOVE ECLIPSE 6.5 STRL STRAW (GLOVE) ×4 IMPLANT
GLOVE ECLIPSE 8.0 STRL XLNG CF (GLOVE) ×2 IMPLANT
GLOVE OPTIFIT SS 6.5 STRL BRWN (GLOVE) ×2 IMPLANT
GOWN STRL REIN XL XLG (GOWN DISPOSABLE) ×6 IMPLANT
KIT ROOM TURNOVER APOR (KITS) ×2 IMPLANT
MANIFOLD NEPTUNE II (INSTRUMENTS) ×2 IMPLANT
NS IRRIG 1000ML POUR BTL (IV SOLUTION) ×2 IMPLANT
PACK PERI GYN (CUSTOM PROCEDURE TRAY) ×2 IMPLANT
PAD ARMBOARD 7.5X6 YLW CONV (MISCELLANEOUS) ×2 IMPLANT
SET BASIN LINEN APH (SET/KITS/TRAYS/PACK) ×2 IMPLANT
SPONGE GAUZE 4X4 12PLY (GAUZE/BANDAGES/DRESSINGS) ×2 IMPLANT
SUT MERSILENE FIBER S 5 CTX 12 (SUTURE) ×4 IMPLANT
SUT PROLENE 1 CT 1 30 (SUTURE) IMPLANT
SUT SILK 2 0 SH (SUTURE) ×2 IMPLANT
TOWEL OR 17X26 4PK STRL BLUE (TOWEL DISPOSABLE) ×2 IMPLANT
TRAY FOLEY CATH 14FR (SET/KITS/TRAYS/PACK) ×2 IMPLANT

## 2011-09-08 NOTE — Op Note (Signed)
Preoperative diagnosis:  1.  Intrauterine pregnancy at [redacted] weeks gestation                                         2.  history of cervical incompetence with previous pregnancy  Postoperative diagnosis:  Same as above  Procedure:  Placement of a McDonald cerclage, using 5 mm Mersilene tape  Surgeon:  Rockne Coons MD  Anesthesia:  Spinal using Marcaine  Findings:  Tara Mahoney is [redacted] weeks pregnant with her second pregnancy.  She had a cerclage placed with her last pregnancy at approximately 20 weeks  due to significant cervical shortening.  Her cervix was about 1.5 cm at that point.  Additionally when her cerclage was removed at 36 weeks she delivered within the next 24 hours.  Since Tara Mahoney has not experienced a second trimester incompetent loss we discussed options including frequent cervical lengths and placement of cerclage if it fail below cm.  We also discussed prophylactic placement at length.  She and I decided upon prophylactic placement in the early second trimester.  Today intraoperatively her cervix is patulous soft mucousy and bleeds easily.  It does appear shortened at least to the level of the bladder reflection.  I was able to get a good 2-2-1/2 cm length circumferentially tied down to a tight fingertip.  Description of operation:  Patient was taken to the operating room where she underwent a spinal anesthetic in the sitting position.  She was then placed in the dorsal lithotomy position using candycane stirrups.  I then prepped and draped in the lower abdomen inner thighs perineum perirectal and vaginal area.  A Foley catheter was placed.  A medium length weighted speculum was placed posteriorly.  2 Deaver retractors were used anteriorly and laterally for retraction and visualization.  A 5 mm Mersilene tape was employed.  It was placed in a counterclockwise fashion at 5 points circumferentially about the cervix.  It was then tied down to a tight fingertip tension at 12:00.  To facilitate  future removal a 2-0 silk suture was placed above the knot and tied down.  The Mersilene tape and were then cut.  Because her cervix was  patulous it bled easily.  Pressure and Monsel solution were used to control the bleeding.  It was all from the external os.    The patient tolerated the procedure well.  She experienced approximately 25 cc of blood loss.  She was taken to the PACU in good stable condition with all counts being correct.  She received a gram of Ancef preoperatively prophylactically. She will be seen in the office next week for her postoperative visit  Tara Mahoney 09/08/2011 8:39 AM

## 2011-09-08 NOTE — Discharge Instructions (Signed)
Cerclage of the Cervix Cerclage of the cervix is a surgical procedure for an incompetent cervix. An incompetent cervix is a weak cervix that opens up before labor begins. Cerclage of the cervix sews the cervix closed during pregnancy.  LET YOUR CAREGIVER KNOW ABOUT:   Allergies to foods or medications.   All over-the-counter, prescription, herbal, eye drops and cream medications you are using.   Taking illegal drugs or drinking an excessive amount of alcohol.   Any recent colds or infections.   Past problems with anesthetics or novocaine.   Past surgery.   History of blood clots or abnormal bleeding problems.   Other medical or health problems.  RISKS AND COMPLICATIONS   Infection.   Bleeding.   Rupturing the amniotic sac (membranes).   Going into early labor and delivery.   Problems with the anesthesia.   Infection of the amniotic sac.  BEFORE THE PROCEDURE   Do not take aspirin.   Do not eat or drink anything 8 hours before the procedure.   Do not smoke.   If you are being admitted the same day as the procedure, arrive at the hospital at least 60 minutes before the surgery or as directed. During this time, you will sign the necessary forms and get prepared for the surgery.   A waiting area is available for family and friends.  PROCEDURE   You will be given an IV (intravenous) and medication to relax you.   You will be put to sleep with a general anesthetic.   A stitch will be placed in and around the cervix to tighten it and keep it closed.  AFTER THE PROCEDURE   You will go to a recovery room where you and the baby are monitored.   Once you are awake, stable, and taking fluids well, barring other problems, you will be allowed to return to your room.   You will usually stay in the hospital overnight.   You may get an injection of progesterone to prevent uterine contractions.   Have someone drive you home and stay with you for a day or two.   You may be  given medications to take when you go home.  HOME CARE INSTRUCTIONS   Only take over-the-counter or prescriptions medicines for pain, discomfort or fever as directed by your caregiver.   Avoid physical activities and exercise until your caregiver says it is okay.   Resume your usual diet.   Do not douche.   Do not have sexual intercourse until your caregiver tells you it is OK.   Keep your follow up surgical and prenatal appointments with your caregiver.  SEEK MEDICAL CARE IF:   You have abnormal vaginal discharge.   You develop a rash.   You are having problems with your medications.   You become lightheaded or feel faint.  SEEK IMMEDIATE MEDICAL CARE IF:   You develop vaginal bleeding.   You are leaking fluid or have a gush of fluid from the vagina.   You develop a temperature of 102 F (38.9 C) or higher.   You pass out.   You have uterine contractions.   You feel the baby is not moving as much as usual or cannot feel the baby move.  Document Released: 06/17/2008 Document Revised: 03/17/2011 Document Reviewed: 06/17/2008 Exit[employer] Patient Information 2012 [employer]. 

## 2011-09-08 NOTE — Transfer of Care (Signed)
Immediate Anesthesia Transfer of Care Note  Patient: Tara Mahoney  Procedure(s) Performed: Procedure(s) (LRB): CERCLAGE CERVICAL (N/A)  Patient Location: PACU  Anesthesia Type: SAB  Level of Consciousness: awake  Airway & Oxygen Therapy: Patient Spontanous Breathing   Post-op Assessment: Report given to PACU RN, Post -op Vital signs reviewed and stable. SAB Level  T 10 Post vital signs: Reviewed and stable  Complications: No apparent anesthesia complications

## 2011-09-08 NOTE — Brief Op Note (Signed)
FHR 152 LEFT MID LOWER ABDOMEN

## 2011-09-08 NOTE — Anesthesia Preprocedure Evaluation (Signed)
Anesthesia Evaluation  Patient identified by MRN, date of birth, ID band Patient awake    Reviewed: Allergy & Precautions, H&P , NPO status , Patient's Chart, lab work & pertinent test results, reviewed documented beta blocker date and time   History of Anesthesia Complications (+) PONV  Airway Mallampati: I      Dental  (+) Teeth Intact   Pulmonary neg pulmonary ROS, shortness of breath and with exertion,  clear to auscultation        Cardiovascular + Valvular Problems/Murmurs (no syncope) MVP Regular Normal    Neuro/Psych  Headaches, Anxiety    GI/Hepatic   Endo/Other    Renal/GU      Musculoskeletal   Abdominal   Peds  Hematology   Anesthesia Other Findings   Reproductive/Obstetrics                           Anesthesia Physical Anesthesia Plan  ASA: II  Anesthesia Plan: Spinal   Post-op Pain Management:    Induction:   Airway Management Planned: Nasal Cannula  Additional Equipment:   Intra-op Plan:   Post-operative Plan:   Informed Consent: I have reviewed the patients History and Physical, chart, labs and discussed the procedure including the risks, benefits and alternatives for the proposed anesthesia with the patient or authorized representative who has indicated his/her understanding and acceptance.     Plan Discussed with:   Anesthesia Plan Comments:         Anesthesia Quick Evaluation

## 2011-09-08 NOTE — Progress Notes (Signed)
LATE ENTRY      Foley cath removed in PACU awithout difficulty.

## 2011-09-08 NOTE — Anesthesia Postprocedure Evaluation (Signed)
Anesthesia Post Note  Patient: Tara Mahoney  Procedure(s) Performed: Procedure(s) (LRB): CERCLAGE CERVICAL (N/A)  Anesthesia type: Spinal  Patient location: PACU  Post pain: Pain level controlled  Post assessment: Post-op Vital signs reviewed, Patient's Cardiovascular Status Stable, Respiratory Function Stable, Patent Airway, No signs of Nausea or vomiting and Pain level controlled  Last Vitals:  Filed Vitals:   09/08/11 0838  BP: 100/56  Pulse: 78  Temp: 36.6 C  Resp:     Post vital signs: Reviewed and stable  Level of consciousness: awake and alert   Complications: No apparent anesthesia complications

## 2011-09-08 NOTE — Interval H&P Note (Signed)
History and Physical Interval Note:  09/08/2011 7:26 AM  Tara Mahoney  has presented today for surgery, with the diagnosis of Incompetent Cervix  The various methods of treatment have been discussed with the patient and family. After consideration of risks, benefits and other options for treatment, the patient has consented to  Procedure(s) (LRB): CERCLAGE CERVICAL (N/A) as a surgical intervention .  The patients' history has been reviewed, patient examined, no change in status, stable for surgery.  I have reviewed the patients' chart and labs.  Questions were answered to the patient's satisfaction.     Tara Mahoney H

## 2011-09-08 NOTE — Anesthesia Procedure Notes (Addendum)
Spinal  Start time: 09/08/2011 7:40 AM End time: 09/08/2011 7:46 AM Staffing CRNA/Resident: Minerva Areola Preanesthetic Checklist Completed: patient identified, surgical consent, pre-op evaluation, timeout performed, IV checked, risks and benefits discussed and monitors and equipment checked Spinal Block Patient position: sitting Prep: Betadine and prep x3 Patient monitoring: heart rate, cardiac monitor, continuous pulse ox and blood pressure Approach: midline (1% lidocaaine skinwheal midline L3-4  1 cc injected) Location: L3-4 Injection technique: single-shot Needle Needle type: Sprotte  Needle gauge: 24 G Needle length: 9 cm Assessment Sensory level: T12 (level at 0750) Events: clear pre and post injection Additional Notes Attempts x 1 Tray [id number] Expiration 2013-11  Date/Time: 09/08/2011 7:50 AM Performed by: Minerva Areola Oxygen Delivery Method: Simple face mask

## 2011-09-10 ENCOUNTER — Encounter (HOSPITAL_COMMUNITY): Payer: Self-pay | Admitting: Obstetrics & Gynecology

## 2012-01-07 ENCOUNTER — Inpatient Hospital Stay (HOSPITAL_COMMUNITY)
Admission: AD | Admit: 2012-01-07 | Discharge: 2012-01-07 | Disposition: A | Discharge status: Home / Self Care | Payer: Medicaid Other | Source: Ambulatory Visit | Attending: Obstetrics and Gynecology | Admitting: Obstetrics and Gynecology

## 2012-01-07 ENCOUNTER — Encounter (HOSPITAL_COMMUNITY): Payer: Self-pay | Admitting: *Deleted

## 2012-01-07 DIAGNOSIS — O99891 Other specified diseases and conditions complicating pregnancy: Secondary | ICD-10-CM | POA: Insufficient documentation

## 2012-01-07 DIAGNOSIS — N898 Other specified noninflammatory disorders of vagina: Secondary | ICD-10-CM

## 2012-01-07 DIAGNOSIS — Z711 Person with feared health complaint in whom no diagnosis is made: Secondary | ICD-10-CM

## 2012-01-07 DIAGNOSIS — N949 Unspecified condition associated with female genital organs and menstrual cycle: Secondary | ICD-10-CM | POA: Insufficient documentation

## 2012-01-07 LAB — AMNISURE RUPTURE OF MEMBRANE (ROM) NOT AT ARMC: Amnisure ROM: NEGATIVE

## 2012-01-07 NOTE — MAU Note (Signed)
Pt has placenta previa and cerclage;

## 2012-01-07 NOTE — MAU Note (Signed)
C/o ?SROM yesterday around 1400;

## 2012-01-07 NOTE — MAU Provider Note (Addendum)
I was present for the exam and agree with above.  Marquia Costello, CNM 01/07/2012 5:20 PM   

## 2012-01-07 NOTE — MAU Note (Signed)
Has not had any intercourse since Feb 21st;

## 2012-01-07 NOTE — MAU Note (Signed)
Patient states she started leaking clear fluid yesterday.

## 2012-01-07 NOTE — Discharge Instructions (Signed)

## 2012-01-07 NOTE — MAU Provider Note (Signed)
S: This is a 26 year old G2P1001 at [redacted]w[redacted]d, with significant PMH of cervical cerclage x 2 (2008, 2013) presenting with slow vaginal leakage of fluid for approximately 24 hours and minor contractions of unknown frequency with duration of 30 seconds or less.  Complications this pregnancy: none  Contractions: u/k frequency; Less than 30 sec in duration Vaginal bleeding: none Vaginal discharge: slow clear leakage x 24 hours Fetal movement: present  O:  Filed Vitals:   01/07/12 1159  BP: 130/78  Pulse: 106  Temp: 98 F (36.7 C)  Resp: 16   Physical Exam: Constitutional: Vital signs reviewed.  Patient is a well-developed and well-nourished woman in no acute distress and cooperative with exam. Alert and oriented x3.  Head: Normocephalic and atraumatic Eyes: conjunctivae normal, No scleral icterus.  Neck: Supple, Trachea midline normal ROM, No JVD, mass, thyromegaly, or carotid bruit present.  Cardiovascular: RRR, unable to appreciate murmur or MVP Pulmonary/Chest: CTAB, no wheezes, rales, or rhonchi Abdominal: Soft. Non-tender, non-distended, bowel sounds are normal, no masses, organomegaly, or guarding present.  GU: no CVA tenderness Musculoskeletal: No joint deformities, erythema, or stiffness, ROM full and no nontender  Neurological: A&O x3, cranial nerve II-XII are grossly intact, no focal motor deficit, sensory intact to light touch bilaterally.  Skin: Warm, dry and intact. No rash, cyanosis, or clubbing.  Psychiatric: Normal mood and affect. speech and behavior is normal. Judgment and thought content normal. Cognition and memory are normal.     Prenatal labs: ABO/Rh: A + (positive) Rubella: Negative (0.5) HBsAg: Negative HIV: Negative HSV: Negative GBS:   Negative Hgb/Plt:  11.9 / 120  MAU course: - vitals check - amnisure test  MDM:  - reviewed test results with patient  A/P: 26 year old G2P1001 @ [redacted]w[redacted]d presents with slow leakage of vaginal fluids for approximately 24  hours and minor ctx. 1. Amnisure ROM test - Negative 2. Manual cervical dilation check - minimally open as to be expected with h/o cerclage 3. Discharge home with instructions to return if ROM, or onset of ctx per labor criteria (<109min. apart, 1 min. or more in duration for approx. 1 hour)

## 2012-01-07 NOTE — MAU Provider Note (Signed)
History     CSN: [id number]  Arrival date and time: 01/07/12 1134   First Provider Initiated Contact with Patient 01/07/12 1334      Chief Complaint  Patient presents with  . Rupture of Membranes   HPI  26 year old female presents with concern for ruptured membranes.  She thinks her membranes may have ruptured yesterday and has had a thin clear fluid since then.    Denies vaginal bleeding, contractions.  She has not had even blood tinged fluid.  Of note, she has documented marginal previa and a cerclage.  OB History    Grav Para Term Preterm Abortions TAB SAB Ect Mult Living   2 1 1       1       Past Medical History  Diagnosis Date  . MVP (mitral valve prolapse)   . Palpitations   . Complication of anesthesia   . Hyperlipidemia   . Headache   . Heart murmur     Past Surgical History  Procedure Date  . Cervical cerclage 2008  . Myringotomy   . Cervical cerclage 09/08/2011    Procedure: CERCLAGE CERVICAL;  Surgeon: Lazaro Arms, MD;  Location: [facility];  Service: Gynecology;  Laterality: N/A;  [name],CST-scrubbed; doctor scrubbed in @ 0807    Family History  Problem Relation Age of Onset  . Hypertension Brother   . Heart disease Brother     hole in heart  . Anesthesia problems Neg Hx   . Hypotension Neg Hx   . Pseudochol deficiency Neg Hx   . Malignant hyperthermia Neg Hx   . Other Neg Hx     History  Substance Use Topics  . Smoking status: Former Smoker -- 1 years    Quit date: 09/01/2009  . Smokeless tobacco: Never Used  . Alcohol Use: No    Allergies:  Allergies  Allergen Reactions  . Hydrocodone-Acetaminophen Itching and Nausea And Vomiting    Prescriptions prior to admission  Medication Sig Dispense Refill  . calcium carbonate (TUMS - DOSED IN MG ELEMENTAL CALCIUM) 500 MG chewable tablet Chew 1 tablet by mouth daily as needed. For indigestion      . ibuprofen (ADVIL,MOTRIN) 200 MG tablet Take 800 mg by mouth daily as needed. For  pain      . metoprolol tartrate (LOPRESSOR) 25 MG tablet Take 25 mg by mouth 2 (two) times daily.        . Prenatal Vit-Fe Fumarate-FA (PRENATAL MULTIVITAMIN) TABS Take 1 tablet by mouth daily.        Review of Systems  Constitutional: Negative for fever and chills.  Respiratory: Negative for shortness of breath.   Cardiovascular: Negative for chest pain.  Gastrointestinal: Negative for abdominal pain.   Other pertinent ROS as per HPI  Physical Exam   Blood pressure 108/71, pulse 86, temperature 98 F (36.7 C), temperature source Oral, resp. rate 16, height 5\' 4"  (1.626 m), weight 72.394 kg (159 lb 9.6 oz), last menstrual period 09/01/2010.  Physical Exam  Constitutional: She is oriented to person, place, and time. She appears well-developed and well-nourished. No distress.  HENT:  Head: Normocephalic and atraumatic.  Eyes: Conjunctivae are normal. Right eye exhibits no discharge. Left eye exhibits no discharge.  Neck: No tracheal deviation present.  Respiratory: Effort normal. No stridor. No respiratory distress.  GI: Soft. She exhibits no distension. There is no tenderness. There is no rebound and no guarding.  Genitourinary:       Speculum exam:  Cervix appeared closed.  Amniosure sent Manual cervical exam deferred secondary to documented previa.  Neurological: She is alert and oriented to person, place, and time.  Skin: She is not diaphoretic.  Psychiatric: She has a normal mood and affect. Her behavior is normal. Judgment and thought content normal.    MAU Course  Procedures  Amniosure negative NST: Baseline 120-130, accelerations present, occ mild variables Assessment and Plan  Vaginal discharge PPROM unlikely Instructed patient to come back to MAU for any concern of rupture, contractions.  Clancy Gourd 01/07/2012, 2:43 PM

## 2012-01-11 NOTE — MAU Provider Note (Signed)
Agree with above note.  Tara Mahoney 01/11/2012 7:28 AM

## 2012-01-17 NOTE — MAU Provider Note (Signed)
Agree with above note.  Chaney Maclaren 01/17/2012 9:52 AM

## 2012-01-25 ENCOUNTER — Encounter (HOSPITAL_COMMUNITY): Payer: Self-pay

## 2012-01-25 ENCOUNTER — Other Ambulatory Visit: Payer: Self-pay | Admitting: Obstetrics & Gynecology

## 2012-01-25 ENCOUNTER — Other Ambulatory Visit (HOSPITAL_COMMUNITY): Payer: Medicaid Other

## 2012-01-25 ENCOUNTER — Encounter (HOSPITAL_COMMUNITY)
Admission: RE | Admit: 2012-01-25 | Discharge: 2012-01-25 | Disposition: A | Discharge status: Home / Self Care | Payer: Medicaid Other | Source: Ambulatory Visit | Attending: Obstetrics & Gynecology | Admitting: Obstetrics & Gynecology

## 2012-01-25 HISTORY — DX: Other specified postprocedural states: Z98.890

## 2012-01-25 HISTORY — DX: Family history of other specified conditions: Z84.89

## 2012-01-25 HISTORY — DX: Nausea with vomiting, unspecified: R11.2

## 2012-01-25 LAB — CBC
Hemoglobin: 12.1 g/dL (ref 12.0–15.0)
MCV: 94 fL (ref 78.0–100.0)
Platelets: 101 10*3/uL — ABNORMAL LOW (ref 150–400)
RBC: 3.65 MIL/uL — ABNORMAL LOW (ref 3.87–5.11)
WBC: 7.4 10*3/uL (ref 4.0–10.5)

## 2012-01-25 LAB — RPR: RPR Ser Ql: NONREACTIVE

## 2012-01-25 LAB — ABO/RH: ABO/RH(D): A POS

## 2012-01-25 NOTE — Patient Instructions (Addendum)
YOUR PROCEDURE IS SCHEDULED ON:01/26/12  ENTER THROUGH THE MAIN ENTRANCE OF [facility] AT:7am USE DESK PHONE AND DIAL [phone] TO INFORM us OF YOUR ARRIVAL  CALL [phone] IF YOU HAVE ANY QUESTIONS OR PROBLEMS PRIOR TO YOUR ARRIVAL.  REMEMBER: DO NOT EAT OR DRINK AFTER MIDNIGHT :tonight   SPECIAL INSTRUCTIONS:Hibiclens wash   YOU MAY BRUSH YOUR TEETH THE MORNING OF SURGERY   TAKE THESE MEDICINES THE DAY OF SURGERY WITH SIP OF WATER: heart med   DO NOT WEAR JEWELRY, EYE MAKEUP, LIPSTICK OR DARK FINGERNAIL POLISH DO NOT WEAR LOTIONS  DO NOT SHAVE FOR 48 HOURS PRIOR TO SURGERY  YOU WILL NOT BE ALLOWED TO DRIVE YOURSELF HOME.

## 2012-01-26 ENCOUNTER — Encounter (HOSPITAL_COMMUNITY): Payer: Self-pay | Admitting: *Deleted

## 2012-01-26 ENCOUNTER — Inpatient Hospital Stay (HOSPITAL_COMMUNITY): Payer: Medicaid Other | Admitting: Anesthesiology

## 2012-01-26 ENCOUNTER — Encounter (HOSPITAL_COMMUNITY): Payer: Self-pay | Admitting: Anesthesiology

## 2012-01-26 ENCOUNTER — Encounter (HOSPITAL_COMMUNITY)
Admission: RE | Disposition: A | Discharge status: Home / Self Care | Payer: Self-pay | Source: Ambulatory Visit | Attending: Obstetrics & Gynecology

## 2012-01-26 ENCOUNTER — Inpatient Hospital Stay (HOSPITAL_COMMUNITY)
Admission: RE | Admit: 2012-01-26 | Discharge: 2012-01-28 | DRG: 765 | Disposition: A | Discharge status: Home / Self Care | Payer: Medicaid Other | Source: Ambulatory Visit | Attending: Obstetrics & Gynecology | Admitting: Obstetrics & Gynecology

## 2012-01-26 DIAGNOSIS — I2510 Atherosclerotic heart disease of native coronary artery without angina pectoris: Secondary | ICD-10-CM | POA: Diagnosis present

## 2012-01-26 DIAGNOSIS — O3430 Maternal care for cervical incompetence, unspecified trimester: Secondary | ICD-10-CM | POA: Diagnosis present

## 2012-01-26 DIAGNOSIS — Z01818 Encounter for other preprocedural examination: Secondary | ICD-10-CM

## 2012-01-26 DIAGNOSIS — Z01812 Encounter for preprocedural laboratory examination: Secondary | ICD-10-CM

## 2012-01-26 DIAGNOSIS — Z90710 Acquired absence of both cervix and uterus: Secondary | ICD-10-CM

## 2012-01-26 DIAGNOSIS — O9942 Diseases of the circulatory system complicating childbirth: Secondary | ICD-10-CM | POA: Diagnosis present

## 2012-01-26 DIAGNOSIS — D689 Coagulation defect, unspecified: Secondary | ICD-10-CM | POA: Diagnosis present

## 2012-01-26 DIAGNOSIS — O4410 Placenta previa with hemorrhage, unspecified trimester: Principal | ICD-10-CM | POA: Diagnosis present

## 2012-01-26 DIAGNOSIS — Z98891 History of uterine scar from previous surgery: Secondary | ICD-10-CM

## 2012-01-26 DIAGNOSIS — I059 Rheumatic mitral valve disease, unspecified: Secondary | ICD-10-CM | POA: Diagnosis present

## 2012-01-26 DIAGNOSIS — D696 Thrombocytopenia, unspecified: Secondary | ICD-10-CM | POA: Diagnosis present

## 2012-01-26 HISTORY — PX: SUPRACERVICAL ABDOMINAL HYSTERECTOMY: SHX5393

## 2012-01-26 LAB — CBC
Hemoglobin: 12.9 g/dL (ref 12.0–15.0)
Hemoglobin: 10.1 g/dL — ABNORMAL LOW (ref 12.0–15.0)
MCHC: 34.1 g/dL (ref 30.0–36.0)
Platelets: 110 10*3/uL — ABNORMAL LOW (ref 150–400)
Platelets: 105 10*3/uL — ABNORMAL LOW (ref 150–400)
RBC: 3.12 MIL/uL — ABNORMAL LOW (ref 3.87–5.11)
RDW: 14 % (ref 11.5–15.5)
WBC: 19.6 10*3/uL — ABNORMAL HIGH (ref 4.0–10.5)
WBC: 16.8 10*3/uL — ABNORMAL HIGH (ref 4.0–10.5)

## 2012-01-26 LAB — DIC (DISSEMINATED INTRAVASCULAR COAGULATION)PANEL
D-Dimer, Quant: >20 ug/mL-FEU — ABNORMAL HIGH (ref 0.00–0.48)
Platelets: 110 10*3/uL — ABNORMAL LOW (ref 150–400)
Smear Review: NONE SEEN
aPTT: 26 seconds (ref 24–37)

## 2012-01-26 LAB — URINALYSIS, ROUTINE W REFLEX MICROSCOPIC
Glucose, UA: NEGATIVE mg/dL
Specific Gravity, Urine: 1.01 (ref 1.005–1.030)

## 2012-01-26 LAB — URINE MICROSCOPIC-ADD ON

## 2012-01-26 SURGERY — Surgical Case
Anesthesia: Spinal | Site: Abdomen | Laterality: Bilateral | Wound class: Clean Contaminated

## 2012-01-26 MED ORDER — METOPROLOL TARTRATE 25 MG PO TABS
25.0000 mg | ORAL_TABLET | Freq: Two times a day (BID) | ORAL | Status: DC
  Administered 2012-01-27 – 2012-01-28 (×3): 25 mg via ORAL
  Filled 2012-01-26 (×4): qty 1

## 2012-01-26 MED ORDER — ACETAMINOPHEN 10 MG/ML IV SOLN
1000.0000 mg | Freq: Four times a day (QID) | INTRAVENOUS | Status: AC | PRN
  Filled 2012-01-26: qty 100

## 2012-01-26 MED ORDER — SIMETHICONE 80 MG PO CHEW
80.0000 mg | CHEWABLE_TABLET | Freq: Three times a day (TID) | ORAL | Status: DC
  Administered 2012-01-26 – 2012-01-28 (×5): 80 mg via ORAL

## 2012-01-26 MED ORDER — MIDAZOLAM HCL 2 MG/2ML IJ SOLN: 0.5000 mg | Freq: Once | INTRAMUSCULAR | Status: DC | PRN

## 2012-01-26 MED ORDER — ONDANSETRON HCL 4 MG PO TABS: 4.0000 mg | ORAL_TABLET | ORAL | Status: DC | PRN

## 2012-01-26 MED ORDER — ONDANSETRON HCL 4 MG/2ML IJ SOLN: 4.0000 mg | Freq: Three times a day (TID) | INTRAMUSCULAR | Status: DC | PRN

## 2012-01-26 MED ORDER — SENNOSIDES-DOCUSATE SODIUM 8.6-50 MG PO TABS
2.0000 | ORAL_TABLET | Freq: Every day | ORAL | Status: DC
  Administered 2012-01-26 – 2012-01-27 (×2): 2 via ORAL

## 2012-01-26 MED ORDER — DIPHENHYDRAMINE HCL 25 MG PO CAPS: 25.0000 mg | ORAL_CAPSULE | Freq: Four times a day (QID) | ORAL | Status: DC | PRN

## 2012-01-26 MED ORDER — IBUPROFEN 600 MG PO TABS
600.0000 mg | ORAL_TABLET | Freq: Four times a day (QID) | ORAL | Status: DC
  Administered 2012-01-26 – 2012-01-28 (×6): 600 mg via ORAL
  Filled 2012-01-26 (×6): qty 1

## 2012-01-26 MED ORDER — MUPIROCIN 2 % EX OINT
TOPICAL_OINTMENT | Freq: Two times a day (BID) | CUTANEOUS | Status: DC
  Administered 2012-01-26: 08:00:00 via NASAL

## 2012-01-26 MED ORDER — FENTANYL CITRATE 0.05 MG/ML IJ SOLN
INTRAMUSCULAR | Status: AC
  Filled 2012-01-26: qty 5

## 2012-01-26 MED ORDER — MENTHOL 3 MG MT LOZG: 1.0000 | LOZENGE | OROMUCOSAL | Status: DC | PRN

## 2012-01-26 MED ORDER — PHENYLEPHRINE HCL 10 MG/ML IJ SOLN
INTRAMUSCULAR | Status: DC | PRN
  Administered 2012-01-26 (×4): 80 ug via INTRAVENOUS
  Administered 2012-01-26 (×2): 120 ug via INTRAVENOUS

## 2012-01-26 MED ORDER — ONDANSETRON HCL 4 MG/2ML IJ SOLN
INTRAMUSCULAR | Status: DC | PRN
  Administered 2012-01-26 (×2): 4 mg via INTRAVENOUS

## 2012-01-26 MED ORDER — ONDANSETRON HCL 4 MG/2ML IJ SOLN
INTRAMUSCULAR | Status: AC
  Filled 2012-01-26: qty 2

## 2012-01-26 MED ORDER — FENTANYL CITRATE 0.05 MG/ML IJ SOLN
INTRAMUSCULAR | Status: AC
  Administered 2012-01-26: 50 ug via INTRAVENOUS
  Filled 2012-01-26: qty 2

## 2012-01-26 MED ORDER — PRENATAL MULTIVITAMIN CH
1.0000 | ORAL_TABLET | Freq: Every day | ORAL | Status: DC
  Administered 2012-01-26 – 2012-01-28 (×3): 1 via ORAL
  Filled 2012-01-26 (×3): qty 1

## 2012-01-26 MED ORDER — METHYLERGONOVINE MALEATE 0.2 MG/ML IJ SOLN
INTRAMUSCULAR | Status: DC | PRN
  Administered 2012-01-26: 0.2 mg via INTRAMUSCULAR

## 2012-01-26 MED ORDER — PHENYLEPHRINE 40 MCG/ML (10ML) SYRINGE FOR IV PUSH (FOR BLOOD PRESSURE SUPPORT)
PREFILLED_SYRINGE | INTRAVENOUS | Status: AC
  Filled 2012-01-26: qty 5

## 2012-01-26 MED ORDER — FENTANYL CITRATE 0.05 MG/ML IJ SOLN
INTRAMUSCULAR | Status: AC
  Filled 2012-01-26: qty 2

## 2012-01-26 MED ORDER — METHYLERGONOVINE MALEATE 0.2 MG/ML IJ SOLN
INTRAMUSCULAR | Status: AC
  Filled 2012-01-26: qty 1

## 2012-01-26 MED ORDER — NALOXONE HCL 0.4 MG/ML IJ SOLN
1.0000 ug/kg/h | INTRAMUSCULAR | Status: DC | PRN
  Filled 2012-01-26: qty 2.5

## 2012-01-26 MED ORDER — OXYCODONE-ACETAMINOPHEN 5-325 MG PO TABS
1.0000 | ORAL_TABLET | ORAL | Status: DC | PRN
  Administered 2012-01-27 (×2): 1 via ORAL
  Administered 2012-01-27 – 2012-01-28 (×3): 2 via ORAL
  Filled 2012-01-26: qty 2
  Filled 2012-01-26 (×2): qty 1
  Filled 2012-01-26 (×2): qty 2

## 2012-01-26 MED ORDER — CARBOPROST TROMETHAMINE 250 MCG/ML IM SOLN
INTRAMUSCULAR | Status: DC | PRN
  Administered 2012-01-26: 1000 ug via INTRAMUSCULAR

## 2012-01-26 MED ORDER — SCOPOLAMINE 1 MG/3DAYS TD PT72
1.0000 | MEDICATED_PATCH | Freq: Once | TRANSDERMAL | Status: DC
  Administered 2012-01-26: 1.5 mg via TRANSDERMAL

## 2012-01-26 MED ORDER — FENTANYL CITRATE 0.05 MG/ML IJ SOLN
25.0000 ug | INTRAMUSCULAR | Status: DC | PRN
  Administered 2012-01-26 (×2): 50 ug via INTRAVENOUS

## 2012-01-26 MED ORDER — OXYTOCIN 40 UNITS IN LACTATED RINGERS INFUSION - SIMPLE MED
INTRAVENOUS | Status: DC | PRN
  Administered 2012-01-26: 40 [IU] via INTRAVENOUS

## 2012-01-26 MED ORDER — CEFAZOLIN SODIUM-DEXTROSE 2-3 GM-% IV SOLR
INTRAVENOUS | Status: AC
  Administered 2012-01-26: 2 g via INTRAVENOUS
  Filled 2012-01-26: qty 50

## 2012-01-26 MED ORDER — DIPHENHYDRAMINE HCL 50 MG/ML IJ SOLN: 25.0000 mg | INTRAMUSCULAR | Status: DC | PRN

## 2012-01-26 MED ORDER — FENTANYL CITRATE 0.05 MG/ML IJ SOLN
INTRAMUSCULAR | Status: DC | PRN
  Administered 2012-01-26: 25 ug via INTRATHECAL
  Administered 2012-01-26: 100 ug via INTRAVENOUS
  Administered 2012-01-26: 50 ug via INTRAVENOUS
  Administered 2012-01-26: 75 ug via INTRAVENOUS

## 2012-01-26 MED ORDER — SCOPOLAMINE 1 MG/3DAYS TD PT72
1.0000 | MEDICATED_PATCH | Freq: Once | TRANSDERMAL | Status: DC
  Filled 2012-01-26: qty 1

## 2012-01-26 MED ORDER — ONDANSETRON HCL 4 MG/2ML IJ SOLN: 4.0000 mg | INTRAMUSCULAR | Status: DC | PRN

## 2012-01-26 MED ORDER — ZOLPIDEM TARTRATE 5 MG PO TABS: 5.0000 mg | ORAL_TABLET | Freq: Every evening | ORAL | Status: DC | PRN

## 2012-01-26 MED ORDER — METOCLOPRAMIDE HCL 5 MG/ML IJ SOLN: 10.0000 mg | Freq: Three times a day (TID) | INTRAMUSCULAR | Status: DC | PRN

## 2012-01-26 MED ORDER — LACTATED RINGERS IV SOLN
INTRAVENOUS | Status: DC | PRN
  Administered 2012-01-26: 09:00:00 via INTRAVENOUS

## 2012-01-26 MED ORDER — DIPHENHYDRAMINE HCL 50 MG/ML IJ SOLN: 12.5000 mg | INTRAMUSCULAR | Status: DC | PRN

## 2012-01-26 MED ORDER — CARBOPROST TROMETHAMINE 250 MCG/ML IM SOLN
INTRAMUSCULAR | Status: AC
  Filled 2012-01-26: qty 4

## 2012-01-26 MED ORDER — MUPIROCIN 2 % EX OINT
TOPICAL_OINTMENT | CUTANEOUS | Status: AC
  Filled 2012-01-26: qty 22

## 2012-01-26 MED ORDER — MEPERIDINE HCL 25 MG/ML IJ SOLN: 6.2500 mg | INTRAMUSCULAR | Status: DC | PRN

## 2012-01-26 MED ORDER — ACETAMINOPHEN 325 MG PO TABS: 325.0000 mg | ORAL_TABLET | ORAL | Status: DC | PRN

## 2012-01-26 MED ORDER — PROPOFOL 10 MG/ML IV EMUL
INTRAVENOUS | Status: AC
  Filled 2012-01-26: qty 20

## 2012-01-26 MED ORDER — KETOROLAC TROMETHAMINE 30 MG/ML IJ SOLN: 30.0000 mg | Freq: Four times a day (QID) | INTRAMUSCULAR | Status: AC | PRN

## 2012-01-26 MED ORDER — SIMETHICONE 80 MG PO CHEW: 80.0000 mg | CHEWABLE_TABLET | ORAL | Status: DC | PRN

## 2012-01-26 MED ORDER — LACTATED RINGERS IV SOLN
INTRAVENOUS | Status: DC
  Administered 2012-01-26 – 2012-01-27 (×3): via INTRAVENOUS

## 2012-01-26 MED ORDER — CEFAZOLIN SODIUM-DEXTROSE 2-3 GM-% IV SOLR: 2.0000 g | INTRAVENOUS | Status: DC

## 2012-01-26 MED ORDER — WITCH HAZEL-GLYCERIN EX PADS: 1.0000 "application " | MEDICATED_PAD | CUTANEOUS | Status: DC | PRN

## 2012-01-26 MED ORDER — CHLOROPROCAINE HCL 3 % IJ SOLN
INTRAMUSCULAR | Status: AC
  Filled 2012-01-26: qty 20

## 2012-01-26 MED ORDER — DIPHENHYDRAMINE HCL 25 MG PO CAPS: 25.0000 mg | ORAL_CAPSULE | ORAL | Status: DC | PRN

## 2012-01-26 MED ORDER — LANOLIN HYDROUS EX OINT: 1.0000 "application " | TOPICAL_OINTMENT | CUTANEOUS | Status: DC | PRN

## 2012-01-26 MED ORDER — NALBUPHINE HCL 10 MG/ML IJ SOLN
5.0000 mg | INTRAMUSCULAR | Status: DC | PRN
  Filled 2012-01-26: qty 1

## 2012-01-26 MED ORDER — SODIUM CHLORIDE 0.9 % IJ SOLN: 3.0000 mL | INTRAMUSCULAR | Status: DC | PRN

## 2012-01-26 MED ORDER — KETOROLAC TROMETHAMINE 30 MG/ML IJ SOLN
30.0000 mg | Freq: Four times a day (QID) | INTRAMUSCULAR | Status: AC | PRN
  Administered 2012-01-26: 30 mg via INTRAVENOUS
  Filled 2012-01-26: qty 1

## 2012-01-26 MED ORDER — LACTATED RINGERS IV SOLN
INTRAVENOUS | Status: DC
  Administered 2012-01-26 (×6): via INTRAVENOUS

## 2012-01-26 MED ORDER — HYDROMORPHONE HCL PF 1 MG/ML IJ SOLN
1.0000 mg | INTRAMUSCULAR | Status: DC | PRN
  Administered 2012-01-26 – 2012-01-27 (×5): 1 mg via INTRAVENOUS
  Filled 2012-01-26 (×5): qty 1

## 2012-01-26 MED ORDER — OXYTOCIN 40 UNITS IN LACTATED RINGERS INFUSION - SIMPLE MED: 62.5000 mL/h | INTRAVENOUS | Status: DC

## 2012-01-26 MED ORDER — TETANUS-DIPHTH-ACELL PERTUSSIS 5-2.5-18.5 LF-MCG/0.5 IM SUSP
0.5000 mL | Freq: Once | INTRAMUSCULAR | Status: AC
  Administered 2012-01-27: 0.5 mL via INTRAMUSCULAR
  Filled 2012-01-26: qty 0.5

## 2012-01-26 MED ORDER — PROMETHAZINE HCL 25 MG/ML IJ SOLN
6.2500 mg | INTRAMUSCULAR | Status: DC | PRN
  Administered 2012-01-26: 6.25 mg via INTRAVENOUS

## 2012-01-26 MED ORDER — MORPHINE SULFATE 0.5 MG/ML IJ SOLN
INTRAMUSCULAR | Status: AC
  Filled 2012-01-26: qty 10

## 2012-01-26 MED ORDER — EPHEDRINE SULFATE 50 MG/ML IJ SOLN
INTRAMUSCULAR | Status: DC | PRN
  Administered 2012-01-26 (×3): 10 mg via INTRAVENOUS
  Administered 2012-01-26 (×2): 5 mg via INTRAVENOUS
  Administered 2012-01-26: 10 mg via INTRAVENOUS

## 2012-01-26 MED ORDER — METOCLOPRAMIDE HCL 5 MG/ML IJ SOLN
INTRAMUSCULAR | Status: DC | PRN
  Administered 2012-01-26 (×2): 5 mg via INTRAVENOUS

## 2012-01-26 MED ORDER — PHENYLEPHRINE 40 MCG/ML (10ML) SYRINGE FOR IV PUSH (FOR BLOOD PRESSURE SUPPORT)
PREFILLED_SYRINGE | INTRAVENOUS | Status: AC
  Filled 2012-01-26: qty 10

## 2012-01-26 MED ORDER — SCOPOLAMINE 1 MG/3DAYS TD PT72
MEDICATED_PATCH | TRANSDERMAL | Status: AC
  Administered 2012-01-26: 1.5 mg via TRANSDERMAL
  Filled 2012-01-26: qty 1

## 2012-01-26 MED ORDER — BUPIVACAINE IN DEXTROSE 0.75-8.25 % IT SOLN
INTRATHECAL | Status: DC | PRN
  Administered 2012-01-26: 1.6 mL via INTRATHECAL

## 2012-01-26 MED ORDER — NALOXONE HCL 0.4 MG/ML IJ SOLN: 0.4000 mg | INTRAMUSCULAR | Status: DC | PRN

## 2012-01-26 MED ORDER — EPHEDRINE 5 MG/ML INJ
INTRAVENOUS | Status: AC
  Filled 2012-01-26: qty 10

## 2012-01-26 MED ORDER — MORPHINE SULFATE (PF) 0.5 MG/ML IJ SOLN
INTRAMUSCULAR | Status: DC | PRN
  Administered 2012-01-26: .15 mg via INTRATHECAL

## 2012-01-26 MED ORDER — DIBUCAINE 1 % RE OINT: 1.0000 "application " | TOPICAL_OINTMENT | RECTAL | Status: DC | PRN

## 2012-01-26 MED ORDER — OXYTOCIN 10 UNIT/ML IJ SOLN
INTRAMUSCULAR | Status: AC
  Filled 2012-01-26: qty 4

## 2012-01-26 MED ORDER — PROMETHAZINE HCL 25 MG/ML IJ SOLN
INTRAMUSCULAR | Status: AC
  Administered 2012-01-26: 6.25 mg via INTRAVENOUS
  Filled 2012-01-26: qty 1

## 2012-01-26 SURGICAL SUPPLY — 38 items
CLOTH BEACON ORANGE TIMEOUT ST (SAFETY) ×3 IMPLANT
DERMABOND ADVANCED (GAUZE/BANDAGES/DRESSINGS) ×2
DERMABOND ADVANCED .7 DNX12 (GAUZE/BANDAGES/DRESSINGS) ×4 IMPLANT
DRESSING TELFA 8X3 (GAUZE/BANDAGES/DRESSINGS) ×3 IMPLANT
DURAPREP 26ML APPLICATOR (WOUND CARE) ×6 IMPLANT
ELECT REM PT RETURN 9FT ADLT (ELECTROSURGICAL) ×3
ELECTRODE REM PT RTRN 9FT ADLT (ELECTROSURGICAL) ×2 IMPLANT
EXTRACTOR VACUUM BELL STYLE (SUCTIONS) IMPLANT
GLOVE BIOGEL PI IND STRL 8 (GLOVE) ×4 IMPLANT
GLOVE BIOGEL PI INDICATOR 8 (GLOVE) ×2
GLOVE ECLIPSE 8.0 STRL XLNG CF (GLOVE) ×3 IMPLANT
KIT ABG SYR 3ML LUER SLIP (SYRINGE) ×3 IMPLANT
NEEDLE HYPO 25X5/8 SAFETYGLIDE (NEEDLE) ×3 IMPLANT
NS IRRIG 1000ML POUR BTL (IV SOLUTION) ×6 IMPLANT
PACK C SECTION WH (CUSTOM PROCEDURE TRAY) ×3 IMPLANT
PAD ABD 7.5X8 STRL (GAUZE/BANDAGES/DRESSINGS) ×3 IMPLANT
RTRCTR C-SECT PINK 25CM LRG (MISCELLANEOUS) IMPLANT
SLEEVE SCD COMPRESS KNEE MED (MISCELLANEOUS) ×3 IMPLANT
SPONGE GAUZE 4X4 12PLY (GAUZE/BANDAGES/DRESSINGS) ×6 IMPLANT
SPONGE LAP 18X18 X RAY DECT (DISPOSABLE) ×12 IMPLANT
STAPLER VISISTAT 35W (STAPLE) IMPLANT
SUT CHROMIC 0 CT 1 (SUTURE) ×3 IMPLANT
SUT MNCRL 0 VIOLET CTX 36 (SUTURE) ×12 IMPLANT
SUT MNCRL+ AB 3-0 CT1 36 (SUTURE) ×10 IMPLANT
SUT MONOCRYL 0 CTX 36 (SUTURE) ×6
SUT MONOCRYL AB 3-0 CT1 36IN (SUTURE) ×5
SUT PLAIN 2 0 (SUTURE) ×2
SUT PLAIN 2 0 XLH (SUTURE) ×3 IMPLANT
SUT PLAIN ABS 2-0 CT1 27XMFL (SUTURE) ×4 IMPLANT
SUT VIC AB 0 CTB1 27 (SUTURE) ×18 IMPLANT
SUT VIC AB 0 CTX 36 (SUTURE) ×1
SUT VIC AB 0 CTX36XBRD ANBCTRL (SUTURE) ×2 IMPLANT
SUT VIC AB 4-0 KS 27 (SUTURE) IMPLANT
SUT VICRYL #0 CTB 1 (SUTURE) ×9 IMPLANT
TAPE CLOTH SURG 4X10 WHT LF (GAUZE/BANDAGES/DRESSINGS) ×3 IMPLANT
TOWEL OR 17X24 6PK STRL BLUE (TOWEL DISPOSABLE) ×6 IMPLANT
TRAY FOLEY CATH 14FR (SET/KITS/TRAYS/PACK) IMPLANT
WATER STERILE IRR 1000ML POUR (IV SOLUTION) IMPLANT

## 2012-01-26 NOTE — Op Note (Signed)
Preoperative diagnosis:  1.  Intrauterine pregnancy at 36-4/[redacted] weeks gestation                                         2.  Marginal/partial placenta previa                                         3.  Almost daily minimal spotting, no significant bleeding episodes during pregnancy                                         4.  cervical incompetence with placement of a McDonald cerclage at [redacted] weeks gestation                                         5.  thrombocytopenia of pregnancy                                         6.  Undesired fertility  Postoperative diagnosis:  Same as above plus uncontrollable hemorrhage of the lower uterine segment placental bed despite surgical and pharmacological remedies  Procedure: 1.  Primary low transverse cesarean section                     2.  supracervical cesarean hysterectomy  Surgeon:  Lazaro Arms  Assistant:  Candelaria Celeste D.O.  Findings:  Yahira was known to have a placenta previa process prior to her heart 20 week ultrasound, the placenta was always posterior marginal/partial.  It was never complete.  She had a McDonald cerclage placed at 14 weeks because of a history of incompetent cervix with her first pregnancy and the decision was made with Elnita Maxwell and her husband to proceed with a prophylactic cerclage with this pregnancy.  We monitored the status of her placenta throughout the pregnancy and although we always thought that it would moved out of low uterine segment it did not.  The last ultrasound was performed 2 days ago.  Interestingly sure ALT has been in pelvic rest the entire pregnancy but has had almost daily spotting, no major heavy bleeding episode to necessitate hospitalization.  I evaluated her on a couple of occasions and did not feel that was coming from the cerclage but again was always very light and insignificant.  We made preparations to proceed with a cesarean section after her last ultrasound revealed the marginal to partial placental  previa location.  Her husband and I talked at length regarding the challenges of delivering a patient with placenta previa and needing a cesarean section.  They also were away her the possibility of the low uterine segment and not contracting down appropriately to stop the bleeding after her placenta was delivered.  Additionally her platelet count at her 28 week lab work was 101,000.  At her first visit it was 175,000.  She had a mild drop with her first pregnancy at just below 150,000.  We repeated her platelet count about 3 weeks ago and at that time  it was 120,000.  Her preoperative platelet count was again 101,000.  Her hemoglobin and hematocrit were stable and at 12 and 34.    Intraoperatively today despite all the surgical measures and pharmacological measures that were taken the entire posterior lower uterine segment continued to bleed significantly.  Fortunately the patient was awake and her husband was present and we talked openly about the problems we are encountering,  the solutions we were trying, and ultimately the need a requirement for cesarean hysterectomy.  The patient her husband both understood this and realized that that was our only option at this point.  I mentioned the patient placement of a Bakri balloon only for completeness as it would not have been appropriate Foley uterine segment bleeding where the low uterine segment with of essentially expanded a great deal accommodating the balloon and not creating appropriate pressure.  The baby was delivered uneventfully in the vertex presentation had good Apgars of 8 and 9 and cord pH was 7.28. The infant went to the regular nursery.  Description of operation:  The patient was taken to the operating room and placed in the sitting position where she underwent a spinal anesthetic.  She was frog legged and her McDonald cerclage was removed without difficulty.  She was then placed in the supine position and prepped and draped in the usual sterile  fashion.  A Pfannenstiel skin incision was made and carried down sharply to the rectus fascia which was scored in the midline extended laterally.  The fascia was taken off the muscles superiorly and inferiorly and the muscles were divided.  The peritoneal cavity was entered a bladder blade was placed.  A low transverse uterine incision was made and over this incision was delivered a viable female with Apgars of 8 and 9.  The infant underwent routine neonatal resuscitation and was appropriate for the newborn nursery.  The uterus was massaged the placenta was delivered spontaneously.  Immediately we evaluated the posterior low uterine segment wall and it was bleeding significantly.  As is my custom with placenta previa as did a natural manual turning kit of the uterine vessels bilaterally for 5 minutes to diminish the pulse pressure to the placental bed.  This is generally successful.  The uterine incision itself was not bleeding and there was no bleeding from the upper uterus at all.  The only site of bleeding was a posterior lower uterine segment wall.  I then gave Methergine IM and was able to locate a couple of dramatic bleeders and lower uterine segment which I tied off with a figure-of-eight suture in the posterior uterine wall.  However the entire posterior wall continued to bleed profusely.  At this point I placed bilateral Antony Salmon an Antony Salmon uterine artery ligation sutures in the usual fashion without difficulty.  Initially this appeared to stem the level of bleeding somewhat and we held pressure for an additional 5 minutes with lap pads and again kinking off of the uterine vessels manually.  Again this was unsuccessful.  I then gave for ampules or 1 g of Hemabate IM.  Again held pressure and waited 5 minutes.  Unfortunately again this was unsuccessful.  At this point we were about 1 hour and out from initial incision as we proceeded methodically using surgical and pharmacological remedies all without  success.  I was incontinent conversation with the patient and her husband regarding R. difficulties as well as the measures we were taken.  At this point I told sure of that I  felt most appropriate to proceed with cesarean hysterectomy as I was completely uncomfortable with the low uterine segment having the integrity to improve her bleeding with time.  Again we were approximately 50 minutes out from delivery and we were still experiencing significant bleeding.  Again the upper uterus an incision were not bleeding total blood loss at this point was about 2000 cc with no abatement in the posterior uterine wall bleeding.  As a result at this point I verbally counseled and permitted the patient her husband for cesarean hysterectomy.  They were in agreement and understanding in the this was our only course of management at this point.  Prior to this I had converted her type and screen to a type and cross and also were making preparations to prepare her platelet since her platelet count was 101,000.  Certainly there was no evidence of DIC or any other coagulopathy at this point but I wanted to be prepared with the platelets.  We made preparations proceed with a cesarean hysterectomy and at the same time get 2 units of blood.  The left round ligament was suture ligated x2 the uterine cornu on the left was grasped in avascular window was made in this pedicle was clamped using a straight Heaney clamp.  The utero-ovarian ligament was double suture ligated another pedicle was taken down through the parametrial tissue and for and aft suture ligated.  Bladder flap was created without difficulty and the bladder was well out of the surgical field.  The right round ligament was suture ligated x2 and ligated the right uterine cornu was grasped with Coker clamp and avascular window made and a straight Heaney clamp used to clamp the utero-ovarian pedicle.  The utero-ovarian pedicle was double suture ligated with good hemostasis.    Pedicles were then taken down the parametrial tissue to the level of the uterine vessels and suture ligated.  Again with the bladder well out of the way of the current operative field the uterine vessels were clamped bilaterally the left uterine vessels were cut and double suture ligated.   the right uterine vessels were clamped and double suture ligated as well.  2 additional pedicles were taken down the cardinal ligament adjacent to the cervix medial to the uterine vessel ligation pedicles.  Both of these pedicles bilaterally were clamped cut and transfixion suture ligated.  I then amputated the uterus and lower uterine segment at this point created making this a supracervical hysterectomy.  Uterine corner sutures were placed incorporating the posterior serve cervix wall the corner of the cervix as well as the anterior uterine wall and these were closed bilaterally.  The cervix was then found to be completely hemostatic as I was below the area of the bleeding from the placenta.  The cervix was closed using interrupted figure-of-eight sutures overlapping through the entire length of the cervix.  All the pedicles were then reexamined of course because of the large vessels and edematous tissue from the pregnancy.  The utero-ovarian pedicles bilaterally and the parametrial pedicles were found to be hemostatic.   the pelvis was irrigated vigorously and again all pedicles were examined and found to be hemostatic.  The muscles and peritoneum were then closed in interrupted fashion.  The fascia was closed using 0 Vicryl running.  The subcutaneous tissue was made hemostatic and irrigated.  The skin was closed using 4-0 Vicryl on a Keith needle in a subcuticular fashion and Dermabond.  A pressure dressing was placed.   The blood loss  for the entire procedure was 2600 cc the patient received 2 units of blood her urine output was excellent at 600 cc and clear.  The patient was taken to recovery in good stable condition all  sponge needle and should counts were correct she received 2 g of Ancef prophylactically preoperatively.  Of course blood blood work will be done in the recovery room as well as this afternoon to evaluate stability of the patient hemodynamically.  The patient and her husband understand that she may require more blood but for the time being she appears to be hemodynamically stable with good urine output.    EURE,LUTHER H 01/26/2012 11:21 AM

## 2012-01-26 NOTE — Anesthesia Procedure Notes (Signed)
Spinal  Patient location during procedure: OR Start time: 01/26/2012 8:35 AM Staffing Anesthesiologist: Nakiyah Beverley A. Performed by: anesthesiologist  Preanesthetic Checklist Completed: patient identified, site marked, surgical consent, pre-op evaluation, timeout performed, IV checked, risks and benefits discussed and monitors and equipment checked Spinal Block Patient position: sitting Prep: site prepped and draped and DuraPrep Patient monitoring: heart rate, cardiac monitor, continuous pulse ox and blood pressure Approach: midline Location: L3-4 Injection technique: single-shot Needle Needle type: Sprotte  Needle gauge: 24 G Needle length: 9 cm Needle insertion depth: 4 cm Assessment Sensory level: T4 Additional Notes Patient tolerated procedure well. Adequate sensory level.

## 2012-01-26 NOTE — Anesthesia Preprocedure Evaluation (Signed)
Anesthesia Evaluation  Patient identified by MRN, date of birth, ID band Patient awake    Reviewed: Allergy & Precautions, H&P , NPO status , Patient's Chart, lab work & pertinent test results  History of Anesthesia Complications (+) PONV and Family history of anesthesia reaction  Airway Mallampati: II      Dental No notable dental hx.    Pulmonary neg pulmonary ROS, shortness of breath,  breath sounds clear to auscultation  Pulmonary exam normal       Cardiovascular Exercise Tolerance: Good negative cardio ROS  + dysrhythmias + Valvular Problems/Murmurs MVP Rhythm:regular Rate:Normal     Neuro/Psych  Headaches, negative neurological ROS  negative psych ROS   GI/Hepatic negative GI ROS, Neg liver ROS,   Endo/Other  negative endocrine ROS  Renal/GU negative Renal ROS  negative genitourinary   Musculoskeletal   Abdominal Normal abdominal exam  (+)   Peds  Hematology negative hematology ROS (+)   Anesthesia Other Findings PONV (postoperative nausea and vomiting)     MVP (mitral valve prolapse)        Palpitations     Hyperlipidemia        Headache     Heart murmur        Complication of anesthesia     Dysrhythmia   sinus arrythmia    Family history of anesthesia complication    Reproductive/Obstetrics (+) Pregnancy                           Anesthesia Physical Anesthesia Plan  ASA: II  Anesthesia Plan: Spinal   Post-op Pain Management:    Induction:   Airway Management Planned:   Additional Equipment:   Intra-op Plan:   Post-operative Plan:   Informed Consent: I have reviewed the patients History and Physical, chart, labs and discussed the procedure including the risks, benefits and alternatives for the proposed anesthesia with the patient or authorized representative who has indicated his/her understanding and acceptance.     Plan Discussed with: Anesthesiologist, CRNA and  Surgeon  Anesthesia Plan Comments:         Anesthesia Quick Evaluation

## 2012-01-26 NOTE — Preoperative (Signed)
Beta Blockers Lopressor 25 mg PO taken @ 06:30Beta Blockers

## 2012-01-26 NOTE — Transfer of Care (Signed)
Immediate Anesthesia Transfer of Care Note  Patient: Tara Mahoney  Procedure(s) Performed: Procedure(s) (LRB): CESAREAN SECTION WITH BILATERAL TUBAL LIGATION (Bilateral) HYSTERECTOMY SUPRACERVICAL ABDOMINAL ()  Patient Location: PACU  Anesthesia Type: Spinal  Level of Consciousness: awake, alert  and oriented  Airway & Oxygen Therapy: Patient Spontanous Breathing and Patient connected to nasal cannula oxygen  Post-op Assessment: Report given to PACU RN and Post -op Vital signs reviewed and stable  Post vital signs: Reviewed and stable  Complications: No apparent anesthesia complications

## 2012-01-26 NOTE — OR Nursing (Signed)
Cord ph 7.26

## 2012-01-26 NOTE — Anesthesia Postprocedure Evaluation (Signed)
  Anesthesia Post-op Note  Patient: Tara Mahoney  Procedure(s) Performed: Procedure(s) (LRB): CESAREAN SECTION WITH BILATERAL TUBAL LIGATION (Bilateral) HYSTERECTOMY SUPRACERVICAL ABDOMINAL ()  Patient Location: PACU  Anesthesia Type: Spinal  Level of Consciousness: awake, alert  and oriented  Airway and Oxygen Therapy: Patient Spontanous Breathing  Post-op Pain: mild  Post-op Assessment: Post-op Vital signs reviewed, Patient's Cardiovascular Status Stable, Respiratory Function Stable, Patent Airway, NAUSEA AND VOMITING PRESENT, Pain level controlled, No headache, No backache, No residual numbness and No residual motor weakness  Post-op Vital Signs: Reviewed and stable  Complications: No apparent anesthesia complications

## 2012-01-26 NOTE — H&P (Signed)
Tara Mahoney is an 26 y.o. female G 2 P 0101 with EDD of 02/19/2012 now at 36 4/7 weeks  with marginal placenta previa and almost daily light spotting, all evaluations of fetus have been normal.  She also has a cerclage placed prophyactically this pregnancy after having one placed emergently last pregnancy.  As a result she is admitted for primary Caesarean section, bilateral tubal ligation at her request and removal of cerclage.  Patient's last menstrual period was 09/01/2010.    Past Medical History  Diagnosis Date  . MVP (mitral valve prolapse)   . Palpitations   . Hyperlipidemia   . Headache   . Heart murmur   . Complication of anesthesia   . PONV (postoperative nausea and vomiting)   . Dysrhythmia     sinus arrythmia  . Family history of anesthesia complication     PONV    Past Surgical History  Procedure Date  . Cervical cerclage 2008  . Myringotomy   . Cervical cerclage 09/08/2011    Procedure: CERCLAGE CERVICAL;  Surgeon: Lazaro Arms, MD;  Location: [facility];  Service: Gynecology;  Laterality: N/A;  [name],CST-scrubbed; doctor scrubbed in @ 0807    Family History  Problem Relation Age of Onset  . Hypertension Brother   . Heart disease Brother     hole in heart  . Anesthesia problems Neg Hx   . Hypotension Neg Hx   . Pseudochol deficiency Neg Hx   . Malignant hyperthermia Neg Hx   . Other Neg Hx     Social History:  reports that she quit smoking about 2 years ago. She has never used smokeless tobacco. She reports that she does not drink alcohol or use illicit drugs.  Allergies:  Allergies  Allergen Reactions  . Hydrocodone-Acetaminophen Itching and Nausea And Vomiting    Prescriptions prior to admission  Medication Sig Dispense Refill  . metoprolol tartrate (LOPRESSOR) 25 MG tablet Take 25 mg by mouth 2 (two) times daily.        . Prenatal Vit-Fe Fumarate-FA (PRENATAL MULTIVITAMIN) TABS Take 1 tablet by mouth daily.      . calcium carbonate (TUMS -  DOSED IN MG ELEMENTAL CALCIUM) 500 MG chewable tablet Chew 1 tablet by mouth daily as needed. For indigestion      . ibuprofen (ADVIL,MOTRIN) 200 MG tablet Take 800 mg by mouth daily as needed. For pain        ROS  Review of Systems  Constitutional: Negative for fever, chills, weight loss, malaise/fatigue and diaphoresis.  HENT: Negative for hearing loss, ear pain, nosebleeds, congestion, sore throat, neck pain, tinnitus and ear discharge.   Eyes: Negative for blurred vision, double vision, photophobia, pain, discharge and redness.  Respiratory: Negative for cough, hemoptysis, sputum production, shortness of breath, wheezing and stridor.   Cardiovascular: Negative for chest pain, palpitations, orthopnea, claudication, leg swelling and PND.  Gastrointestinal: negative for abdominal pain. Negative for heartburn, nausea, vomiting, diarrhea, constipation, blood in stool and melena.  Genitourinary: Negative for dysuria, urgency, frequency, hematuria and flank pain.  Musculoskeletal: Negative for myalgias, back pain, joint pain and falls.  Skin: Negative for itching and rash.  Neurological: Negative for dizziness, tingling, tremors, sensory change, speech change, focal weakness, seizures, loss of consciousness, weakness and headaches.  Endo/Heme/Allergies: Negative for environmental allergies and polydipsia. Does not bruise/bleed easily.  Psychiatric/Behavioral: Negative for depression, suicidal ideas, hallucinations, memory loss and substance abuse. The patient is not nervous/anxious and does not have insomnia.  Blood pressure 119/69, pulse 89, temperature 98.4 F (36.9 C), temperature source Oral, resp. rate 16, last menstrual period 09/01/2010, SpO2 98.00%. Physical Exam Physical Exam  Vitals reviewed. Constitutional: She is oriented to person, place, and time. She appears well-developed and well-nourished.  HENT:  Head: Normocephalic and atraumatic.  Right Ear: External ear normal.    Left Ear: External ear normal.  Nose: Nose normal.  Mouth/Throat: Oropharynx is clear and moist.  Eyes: Conjunctivae and EOM are normal. Pupils are equal, round, and reactive to light. Right eye exhibits no discharge. Left eye exhibits no discharge. No scleral icterus.  Neck: Normal range of motion. Neck supple. No tracheal deviation present. No thyromegaly present.  Cardiovascular: Normal rate, regular rhythm, normal heart sounds and intact distal pulses.  Exam reveals no gallop and no friction rub.   No murmur heard. Respiratory: Effort normal and breath sounds normal. No respiratory distress. She has no wheezes. She has no rales. She exhibits no tenderness.  GI: Soft. Bowel sounds are normal. Uteriine size is appropriate for gestation. There is no tenderness. There is no rebound and no guarding.  Genitourinary:       Vulva is normal without lesions Vagina is pink moist without discharge Cervix not assessed Uterus is gravid Adnexa is negative with normal sized ovaries by sonogram in first trimester Musculoskeletal: Normal range of motion. She exhibits no edema and no tenderness.  Neurological: She is alert and oriented to person, place, and time. She has normal reflexes. She displays normal reflexes. No cranial nerve deficit. She exhibits normal muscle tone. Coordination normal.  Skin: Skin is warm and dry. No rash noted. No erythema. No pallor.  Psychiatric: She has a normal mood and affect. Her behavior is normal. Judgment and thought content normal.     Results for orders placed during the hospital encounter of 01/26/12 (from the past 24 hour(s))  URINALYSIS, ROUTINE W REFLEX MICROSCOPIC     Status: Abnormal   Collection Time   01/26/12  7:05 AM      Component Value Range   Color, Urine YELLOW  YELLOW   APPearance CLEAR  CLEAR   Specific Gravity, Urine 1.010  1.005 - 1.030   pH 7.0  5.0 - 8.0   Glucose, UA NEGATIVE  NEGATIVE mg/dL   Hgb urine dipstick TRACE (*) NEGATIVE    Bilirubin Urine NEGATIVE  NEGATIVE   Ketones, ur NEGATIVE  NEGATIVE mg/dL   Protein, ur NEGATIVE  NEGATIVE mg/dL   Urobilinogen, UA 0.2  0.0 - 1.0 mg/dL   Nitrite NEGATIVE  NEGATIVE   Leukocytes, UA MODERATE (*) NEGATIVE  URINE MICROSCOPIC-ADD ON     Status: Abnormal   Collection Time   01/26/12  7:05 AM      Component Value Range   Squamous Epithelial / LPF MANY (*) RARE   WBC, UA 7-10  <3 WBC/hpf   RBC / HPF 0-2  <3 RBC/hpf   Bacteria, UA FEW (*) RARE    Recent Results (from the past 336 hour(s))  CBC   Collection Time   01/25/12  9:09 AM      Component Value Range   WBC 7.4  4.0 - 10.5 K/uL   RBC 3.65 (*) 3.87 - 5.11 MIL/uL   Hemoglobin 12.1  12.0 - 15.0 g/dL   HCT 96.0 (*) 45.4 - 09.8 %   MCV 94.0  78.0 - 100.0 fL   MCH 33.2  26.0 - 34.0 pg   MCHC 35.3  30.0 - 36.0 g/dL  RDW 13.9  11.5 - 15.5 %   Platelets 101 (*) 150 - 400 K/uL  RPR   Collection Time   01/25/12  9:09 AM      Component Value Range   RPR NON REACTIVE  NON REACTIVE  TYPE AND SCREEN   Collection Time   01/25/12  9:09 AM      Component Value Range   ABO/RH(D) A POS     Antibody Screen NEG     Sample Expiration 01/28/2012     Unit Number [id number]     Blood Component Type RED CELLS,LR     Unit division 00     Status of Unit ALLOCATED     Transfusion Status OK TO TRANSFUSE     Crossmatch Result Compatible     Unit Number [id number]     Blood Component Type RED CELLS,LR     Unit division 00     Status of Unit ALLOCATED     Transfusion Status OK TO TRANSFUSE     Crossmatch Result Compatible    SURGICAL PCR SCREEN   Collection Time   01/25/12  9:10 AM      Component Value Range   MRSA, PCR NEGATIVE  NEGATIVE   Staphylococcus aureus POSITIVE (*) NEGATIVE  PREPARE RBC (CROSSMATCH)   Collection Time   01/25/12  9:10 AM      Component Value Range   Order Confirmation ORDER PROCESSED BY BLOOD BANK    ABO/RH   Collection Time   01/25/12  9:30 AM      Component Value Range   ABO/RH(D) A POS    URINALYSIS,  ROUTINE W REFLEX MICROSCOPIC   Collection Time   01/26/12  7:05 AM      Component Value Range   Color, Urine YELLOW  YELLOW   APPearance CLEAR  CLEAR   Specific Gravity, Urine 1.010  1.005 - 1.030   pH 7.0  5.0 - 8.0   Glucose, UA NEGATIVE  NEGATIVE mg/dL   Hgb urine dipstick TRACE (*) NEGATIVE   Bilirubin Urine NEGATIVE  NEGATIVE   Ketones, ur NEGATIVE  NEGATIVE mg/dL   Protein, ur NEGATIVE  NEGATIVE mg/dL   Urobilinogen, UA 0.2  0.0 - 1.0 mg/dL   Nitrite NEGATIVE  NEGATIVE   Leukocytes, UA MODERATE (*) NEGATIVE  URINE MICROSCOPIC-ADD ON   Collection Time   01/26/12  7:05 AM      Component Value Range   Squamous Epithelial / LPF MANY (*) RARE   WBC, UA 7-10  <3 WBC/hpf   RBC / HPF 0-2  <3 RBC/hpf   Bacteria, UA FEW (*) RARE      Assessment/Plan: 1.  36 4/[redacted] weeks gestation 2.  Placenta previa 3.  Daily spotting 4.  Incompetent cervix with McDonald cerclage 5.  Desires permanent sterilization  Proceed with primary Caesarean section, tubal ligation and removal of cerclage.  Naftali Carchi H 01/26/2012, 8:16 AM

## 2012-01-27 ENCOUNTER — Encounter (HOSPITAL_COMMUNITY): Payer: Self-pay | Admitting: Obstetrics & Gynecology

## 2012-01-27 LAB — CBC
HCT: 27.2 % — ABNORMAL LOW (ref 36.0–46.0)
MCV: 102.3 fL — ABNORMAL HIGH (ref 78.0–100.0)
Platelets: 82 10*3/uL — ABNORMAL LOW (ref 150–400)
RBC: 2.66 MIL/uL — ABNORMAL LOW (ref 3.87–5.11)
WBC: 11.1 10*3/uL — ABNORMAL HIGH (ref 4.0–10.5)

## 2012-01-27 NOTE — Anesthesia Postprocedure Evaluation (Signed)
  Anesthesia Post-op Note  Patient: Tara Mahoney  Procedure(s) Performed: Procedure(s) (LRB): CESAREAN SECTION WITH BILATERAL TUBAL LIGATION (Bilateral) HYSTERECTOMY SUPRACERVICAL ABDOMINAL ()  Patient Location: A-ICU  Anesthesia Type: Spinal  Level of Consciousness: awake  Airway and Oxygen Therapy: Patient Spontanous Breathing  Post-op Pain: none  Post-op Assessment: Patient's Cardiovascular Status Stable, Respiratory Function Stable, Patent Airway, No signs of Nausea or vomiting, Adequate PO intake, Pain level controlled, No headache, No backache, No residual numbness and No residual motor weakness  Post-op Vital Signs: Reviewed and stable  Complications: No apparent anesthesia complications

## 2012-01-27 NOTE — Progress Notes (Signed)
UR chart review completed.  

## 2012-01-27 NOTE — Progress Notes (Signed)
Subjective: Postpartum Day 1: Cesarean Delivery with hyst Patient reports incisional pain and tolerating PO.  States she also has some back pain. Denies chest pain and shortness of breath.  No BM yet. Complains of some swelling in LE and hands.  Objective: Vital signs in last 24 hours: Temp:  [97.5 F (36.4 C)-98.5 F (36.9 C)] 98.1 F (36.7 C) (07/11 0323) Pulse Rate:  [70-109] 78  (07/11 0700) Resp:  [12-22] 18  (07/11 0600) BP: (94-131)/(43-81) 107/56 mmHg (07/11 0700) SpO2:  [96 %-100 %] 97 % (07/11 0700) Weight:  [69.99 kg (154 lb 4.8 oz)] 69.99 kg (154 lb 4.8 oz) (07/10 1331)  Physical Exam:  General: alert and cooperative Lochia: absent, pad dry Uterine Fundus: absent Incision: bandage clean and dry DVT Evaluation: No evidence of DVT seen on physical exam. Negative Homan's sign. No cords or calf tenderness. CV: regular rate and rhythm, no murmurs Pulm: clear to auscultation    Basename 01/27/12 0530 01/26/12 2015  HGB 8.6* 10.1*  HCT 27.2* 28.5*    Assessment/Plan: Status post Cesarean section. Doing well postoperatively.  Continue current care.  Dilaudid for pain.  Try to get out of bed today.   Marikay Alar 01/27/2012, 7:31 AM   Patient seen and examined.  Incisional pain.  Tolerating PO without difficulty.  Limited ambulation. Exam: Agree with resident note Assessment/Plan: D/c foley.  Encouraged ambulation and out of bed. D/C dilaudid - scheduled motrin and regular percocet in place.  Transfer to floor later this morning.  Levie Heritage, DO 01/27/2012 9:34 AM

## 2012-01-27 NOTE — Addendum Note (Signed)
Addendum  created 01/27/12 0819 by Suella Grove, CRNA   Modules edited:Notes Section

## 2012-01-27 NOTE — Progress Notes (Signed)
Pt. Is ready to progress to Mother/Baby. Report was called to [place], Charity fundraiser. Family was notified and family moved belongings to room 148. Pt. Stable and walking with wheelchair to new room.

## 2012-01-28 LAB — TYPE AND SCREEN
Antibody Screen: NEGATIVE
Unit division: 0
Unit division: 0
Unit division: 0

## 2012-01-28 MED ORDER — IBUPROFEN 600 MG PO TABS: 600.0000 mg | ORAL_TABLET | Freq: Four times a day (QID) | ORAL | Status: AC | PRN

## 2012-01-28 MED ORDER — OXYCODONE-ACETAMINOPHEN 5-325 MG PO TABS: 1.0000 | ORAL_TABLET | ORAL | Status: AC | PRN

## 2012-01-28 MED ORDER — SENNOSIDES-DOCUSATE SODIUM 8.6-50 MG PO TABS: 2.0000 | ORAL_TABLET | Freq: Every day | ORAL | Status: AC

## 2012-01-28 NOTE — Discharge Summary (Signed)
Obstetric Discharge Summary Reason for Admission: cesarean section for placenta previa Prenatal Procedures: none Intrapartum Procedures: cesarean: hysterectomy Postpartum Procedures: transfusion 2 units Complications-Operative and Postpartum: none Hemoglobin  Date Value Range Status  01/27/2012 8.6* 12.0 - 15.0 g/dL Final     HCT  Date Value Range Status  01/27/2012 27.2* 36.0 - 46.0 % Final    Physical Exam:  General: alert, cooperative and no distress Lochia: appropriate Uterine Fundus: absent Incision: healing well, no significant drainage, no dehiscence, no significant erythema DVT Evaluation: No evidence of DVT seen on physical exam. Negative Homan's sign. No cords or calf tenderness. No significant calf/ankle edema.  Discharge Diagnoses: Term Pregnancy-delivered  Discharge Information: Date: 01/28/2012 Activity: pelvic rest Diet: routine Medications: PNV, Ibuprofen, Colace and Percocet Condition: stable Instructions: refer to practice specific booklet Discharge to: home Follow-up Information    Follow up with [facility] on 02/02/2012.         Newborn Data: Live born female  Birth Weight: 5 lb 13.7 oz (2655 g) APGAR: 7, 8  Home with mother.  Caz Weaver JEHIEL 01/28/2012, 8:06 AM

## 2012-01-28 NOTE — Discharge Summary (Signed)
Patient seen and examined.  Agree with above note.  Levie Heritage, DO 01/28/2012 7:11 PM

## 2012-01-28 NOTE — Discharge Summary (Signed)
Obstetric Discharge Summary Reason for Admission: placenta previa Prenatal Procedures: cerclage Intrapartum Procedures: cesarean: hysterectomy Postpartum Procedures: transfusion 2 units of blood Complications-Operative and Postpartum: hemorrhage Hemoglobin  Date Value Range Status  01/27/2012 8.6* 12.0 - 15.0 g/dL Final     HCT  Date Value Range Status  01/27/2012 27.2* 36.0 - 46.0 % Final    Physical Exam:  General: alert and cooperative Lochia: appropriate Uterine Fundus: uterus removed Incision: healing well, no significant drainage, no dehiscence, no significant erythema DVT Evaluation: No evidence of DVT seen on physical exam. Negative Homan's sign. No cords or calf tenderness. No significant calf/ankle edema.  Discharge Diagnoses: Placenta previa  Discharge Information: Date: 01/28/2012 Activity: unrestricted Diet: routine Medications: Ibuprofen, Colace and Percocet Condition: stable Instructions: refer to practice specific booklet Discharge to: home   Newborn Data: Live born female  Birth Weight: 5 lb 13.7 oz (2655 g) APGAR: 7, 8  Home with mother.  Marikay Alar 01/28/2012, 7:39 AM

## 2012-08-03 ENCOUNTER — Encounter (HOSPITAL_COMMUNITY): Payer: Self-pay

## 2012-08-03 ENCOUNTER — Other Ambulatory Visit: Payer: Self-pay

## 2012-08-03 ENCOUNTER — Encounter (HOSPITAL_COMMUNITY)
Admission: RE | Admit: 2012-08-03 | Discharge: 2012-08-03 | Disposition: A | Discharge status: Home / Self Care | Payer: Medicaid Other | Source: Ambulatory Visit | Attending: Obstetrics & Gynecology | Admitting: Obstetrics & Gynecology

## 2012-08-03 ENCOUNTER — Other Ambulatory Visit: Payer: Self-pay | Admitting: Obstetrics & Gynecology

## 2012-08-03 LAB — COMPREHENSIVE METABOLIC PANEL
ALT: 11 U/L (ref 0–35)
AST: 15 U/L (ref 0–37)
Albumin: 4.6 g/dL (ref 3.5–5.2)
Alkaline Phosphatase: 51 U/L (ref 39–117)
Chloride: 102 mEq/L (ref 96–112)
Creatinine, Ser: 0.91 mg/dL (ref 0.50–1.10)
Potassium: 4.1 mEq/L (ref 3.5–5.1)
Sodium: 138 mEq/L (ref 135–145)
Total Bilirubin: 0.5 mg/dL (ref 0.3–1.2)

## 2012-08-03 LAB — CBC
Platelets: 127 10*3/uL — ABNORMAL LOW (ref 150–400)
RDW: 12.7 % (ref 11.5–15.5)
WBC: 4.7 10*3/uL (ref 4.0–10.5)

## 2012-08-03 LAB — URINALYSIS, ROUTINE W REFLEX MICROSCOPIC
Glucose, UA: NEGATIVE mg/dL
Protein, ur: NEGATIVE mg/dL

## 2012-08-03 LAB — URINE MICROSCOPIC-ADD ON

## 2012-08-03 LAB — TYPE AND SCREEN: ABO/RH(D): A POS

## 2012-08-03 MED ORDER — KETOROLAC TROMETHAMINE 30 MG/ML IJ SOLN: 30.0000 mg | Freq: Once | INTRAMUSCULAR | Status: DC

## 2012-08-03 NOTE — Patient Instructions (Addendum)
Tara Mahoney  08/03/2012   Your procedure is scheduled on:   08/09/2012  Report to [facility] at  920 AM.  Call this number if you have problems the morning of surgery: [phone]   Remember:   Do not eat food or drink liquids after midnight.   Take these medicines the morning of surgery with A SIP OF WATER:  lopressor   Do not wear jewelry, make-up or nail polish.  Do not wear lotions, powders, or perfumes.   Do not shave 48 hours prior to surgery. Men may shave face and neck.  Do not bring valuables to the hospital.  Contacts, dentures or bridgework may not be worn into surgery.  Leave suitcase in the car. After surgery it may be brought to your room.  For patients admitted to the hospital, checkout time is 11:00 AM the day of discharge.   Patients discharged the day of surgery will not be allowed to drive  home.  Name and phone number of your driver: family  Special Instructions: Shower using CHG 2 nights before surgery and the night before surgery.  If you shower the day of surgery use CHG.  Use special wash - you have one bottle of CHG for all showers.  You should use approximately 1/3 of the bottle for each shower.   Please read over the following fact sheets that you were given: Pain Booklet, Coughing and Deep Breathing, MRSA Information, Surgical Site Infection Prevention, Anesthesia Post-op Instructions and Care and Recovery After Surgery Diagnostic Laparoscopy Laparoscopy is a surgical procedure. It is used to diagnose and treat diseases inside the belly(abdomen). It is usually a brief, common, and relatively simple procedure. The laparoscopeis a thin, lighted, pencil-sized instrument. It is like a telescope. It is inserted into your abdomen through a small cut (incision). Your caregiver can look at the organs inside your body through this instrument. He or she can see if there is anything abnormal. Laparoscopy can be done either in a hospital or outpatient clinic. You  may be given a mild sedative to help you relax before the procedure. Once in the operating room, you will be given a drug to make you sleep (general anesthesia). Laparoscopy usually lasts less than 1 hour. After the procedure, you will be monitored in a recovery area until you are stable and doing well. Once you are home, it will take 2 to 3 days to fully recover. RISKS AND COMPLICATIONS  Laparoscopy has relatively few risks. Your caregiver will discuss the risks with you before the procedure. Some problems that can occur include:  Infection.  Bleeding.  Damage to other organs.  Anesthetic side effects. PROCEDURE Once you receive anesthesia, your surgeon inflates the abdomen with a harmless gas (carbon dioxide). This makes the organs easier to see. The laparoscope is inserted into the abdomen through a small incision. This allows your surgeon to see into the abdomen. Other small instruments are also inserted into the abdomen through other small openings. Many surgeons attach a video camera to the laparoscope to enlarge the view. During a diagnostic laparoscopy, the surgeon may be looking for inflammation, infection, or cancer. Your surgeon may take tissue samples(biopsies). The samples are sent to a specialist in looking at cells and tissue samples (pathologist). The pathologist examines them under a microscope. Biopsies can help to diagnose or confirm a disease. AFTER THE PROCEDURE   The gas is released from inside the abdomen.  The incisions are closed with  stitches (sutures). Because these incisions are small (usually less than 1/2 inch), there is usually minimal discomfort after the procedure. There may be some mild discomfort in the throat. This is from the tube placed in the throat while you were sleeping. You may have some mild abdominal discomfort. There may also be discomfort from the instrument placement incisions in the abdomen.  The recovery time is shortened as long as there are no  complications.  You will rest in a recovery room until stable and doing well. As long as there are no complications, you may be allowed to go home. FINDING OUT THE RESULTS OF YOUR TEST Not all test results are available during your visit. If your test results are not back during the visit, make an appointment with your caregiver to find out the results. Do not assume everything is normal if you have not heard from your caregiver or the medical facility. It is important for you to follow up on all of your test results. HOME CARE INSTRUCTIONS   Take all medicines as directed.  Only take over-the-counter or prescription medicines for pain, discomfort, or fever as directed by your caregiver.  Resume daily activities as directed.  Showers are preferred over baths.  You may resume sexual activities in 1 week or as directed.  Do not drive while taking narcotics. SEEK MEDICAL CARE IF:   There is increasing abdominal pain.  There is new pain in the shoulders (shoulder strap areas).  You feel lightheaded or faint.  You have the chills.  You or your child has an oral temperature above 102 F (38.9 C).  There is pus-like (purulent) drainage from any of the wounds.  You are unable to pass gas or have a bowel movement.  You feel sick to your stomach (nauseous) or throw up (vomit). MAKE SURE YOU:   Understand these instructions.  Will watch your condition.  Will get help right away if you are not doing well or get worse. Document Released: 10/11/2000 Document Revised: 09/27/2011 Document Reviewed: 07/05/2007 [facility] Patient Information 2013 [place], [place]. PATIENT INSTRUCTIONS POST-ANESTHESIA  IMMEDIATELY FOLLOWING SURGERY:  Do not drive or operate machinery for the first twenty four hours after surgery.  Do not make any important decisions for twenty four hours after surgery or while taking narcotic pain medications or sedatives.  If you develop intractable nausea and vomiting or  a severe headache please notify your doctor immediately.  FOLLOW-UP:  Please make an appointment with your surgeon as instructed. You do not need to follow up with anesthesia unless specifically instructed to do so.  WOUND CARE INSTRUCTIONS (if applicable):  Keep a dry clean dressing on the anesthesia/puncture wound site if there is drainage.  Once the wound has quit draining you may leave it open to air.  Generally you should leave the bandage intact for twenty four hours unless there is drainage.  If the epidural site drains for more than 36-48 hours please call the anesthesia department.  QUESTIONS?:  Please feel free to call your physician or the hospital operator if you have any questions, and they will be happy to assist you.

## 2012-08-04 NOTE — Pre-Procedure Instructions (Signed)
Dr Jayme Cloud aware of plts 127. No orders given.

## 2012-08-09 ENCOUNTER — Encounter (HOSPITAL_COMMUNITY): Payer: Self-pay | Admitting: Anesthesiology

## 2012-08-09 ENCOUNTER — Encounter (HOSPITAL_COMMUNITY): Payer: Self-pay | Admitting: *Deleted

## 2012-08-09 ENCOUNTER — Ambulatory Visit (HOSPITAL_COMMUNITY)
Admission: RE | Admit: 2012-08-09 | Discharge: 2012-08-09 | Disposition: A | Discharge status: Home / Self Care | Payer: Medicaid Other | Source: Ambulatory Visit | Attending: Obstetrics & Gynecology | Admitting: Obstetrics & Gynecology

## 2012-08-09 ENCOUNTER — Encounter (HOSPITAL_COMMUNITY)
Admission: RE | Disposition: A | Discharge status: Home / Self Care | Payer: Self-pay | Source: Ambulatory Visit | Attending: Obstetrics & Gynecology

## 2012-08-09 ENCOUNTER — Ambulatory Visit (HOSPITAL_COMMUNITY): Payer: Medicaid Other | Admitting: Anesthesiology

## 2012-08-09 DIAGNOSIS — E785 Hyperlipidemia, unspecified: Secondary | ICD-10-CM | POA: Insufficient documentation

## 2012-08-09 DIAGNOSIS — Z90711 Acquired absence of uterus with remaining cervical stump: Secondary | ICD-10-CM | POA: Insufficient documentation

## 2012-08-09 DIAGNOSIS — N938 Other specified abnormal uterine and vaginal bleeding: Secondary | ICD-10-CM | POA: Insufficient documentation

## 2012-08-09 DIAGNOSIS — N930 Postcoital and contact bleeding: Secondary | ICD-10-CM | POA: Insufficient documentation

## 2012-08-09 DIAGNOSIS — Z01812 Encounter for preprocedural laboratory examination: Secondary | ICD-10-CM | POA: Insufficient documentation

## 2012-08-09 DIAGNOSIS — N949 Unspecified condition associated with female genital organs and menstrual cycle: Secondary | ICD-10-CM | POA: Insufficient documentation

## 2012-08-09 DIAGNOSIS — IMO0002 Reserved for concepts with insufficient information to code with codable children: Secondary | ICD-10-CM

## 2012-08-09 DIAGNOSIS — Z01810 Encounter for preprocedural cardiovascular examination: Secondary | ICD-10-CM | POA: Insufficient documentation

## 2012-08-09 HISTORY — PX: LAPAROSCOPIC LYSIS OF ADHESIONS: SHX5905

## 2012-08-09 HISTORY — PX: LAPAROSCOPY: SHX197

## 2012-08-09 SURGERY — LAPAROSCOPY, DIAGNOSTIC
Anesthesia: General | Site: Abdomen | Wound class: Clean Contaminated

## 2012-08-09 MED ORDER — PROPOFOL 10 MG/ML IV BOLUS
INTRAVENOUS | Status: DC | PRN
  Administered 2012-08-09: 140 mg via INTRAVENOUS

## 2012-08-09 MED ORDER — NEOSTIGMINE METHYLSULFATE 1 MG/ML IJ SOLN
INTRAMUSCULAR | Status: AC
  Filled 2012-08-09: qty 1

## 2012-08-09 MED ORDER — MIDAZOLAM HCL 2 MG/2ML IJ SOLN
INTRAMUSCULAR | Status: AC
  Filled 2012-08-09: qty 2

## 2012-08-09 MED ORDER — OXYCODONE-ACETAMINOPHEN 7.5-500 MG PO TABS: 1.0000 | ORAL_TABLET | Freq: Four times a day (QID) | ORAL | Status: DC | PRN

## 2012-08-09 MED ORDER — DROPERIDOL 2.5 MG/ML IJ SOLN
INTRAMUSCULAR | Status: DC | PRN
  Administered 2012-08-09: 0.625 mg via INTRAVENOUS

## 2012-08-09 MED ORDER — ONDANSETRON HCL 4 MG/2ML IJ SOLN: 4.0000 mg | Freq: Once | INTRAMUSCULAR | Status: DC | PRN

## 2012-08-09 MED ORDER — ROCURONIUM BROMIDE 100 MG/10ML IV SOLN
INTRAVENOUS | Status: DC | PRN
  Administered 2012-08-09: 5 mg via INTRAVENOUS
  Administered 2012-08-09: 35 mg via INTRAVENOUS

## 2012-08-09 MED ORDER — NEOSTIGMINE METHYLSULFATE 1 MG/ML IJ SOLN
INTRAMUSCULAR | Status: DC | PRN
  Administered 2012-08-09: 1 mg via INTRAVENOUS
  Administered 2012-08-09: 3 mg via INTRAVENOUS

## 2012-08-09 MED ORDER — SCOPOLAMINE 1 MG/3DAYS TD PT72
MEDICATED_PATCH | TRANSDERMAL | Status: AC
  Filled 2012-08-09: qty 1

## 2012-08-09 MED ORDER — GLYCOPYRROLATE 0.2 MG/ML IJ SOLN
INTRAMUSCULAR | Status: DC | PRN
  Administered 2012-08-09: 0.6 mg via INTRAVENOUS
  Administered 2012-08-09: 0.2 mg via INTRAVENOUS

## 2012-08-09 MED ORDER — SCOPOLAMINE 1 MG/3DAYS TD PT72
1.0000 | MEDICATED_PATCH | Freq: Once | TRANSDERMAL | Status: DC
  Administered 2012-08-09: 1.5 mg via TRANSDERMAL

## 2012-08-09 MED ORDER — ONDANSETRON HCL 8 MG PO TABS: 8.0000 mg | ORAL_TABLET | Freq: Three times a day (TID) | ORAL | Status: DC | PRN

## 2012-08-09 MED ORDER — FENTANYL CITRATE 0.05 MG/ML IJ SOLN
INTRAMUSCULAR | Status: AC
  Filled 2012-08-09: qty 5

## 2012-08-09 MED ORDER — FENTANYL CITRATE 0.05 MG/ML IJ SOLN
INTRAMUSCULAR | Status: AC
  Filled 2012-08-09: qty 2

## 2012-08-09 MED ORDER — LIDOCAINE HCL (PF) 1 % IJ SOLN
INTRAMUSCULAR | Status: AC
  Filled 2012-08-09: qty 5

## 2012-08-09 MED ORDER — CEFAZOLIN SODIUM-DEXTROSE 2-3 GM-% IV SOLR
2.0000 g | INTRAVENOUS | Status: AC
  Administered 2012-08-09: 2 g via INTRAVENOUS

## 2012-08-09 MED ORDER — FENTANYL CITRATE 0.05 MG/ML IJ SOLN
INTRAMUSCULAR | Status: DC | PRN
  Administered 2012-08-09 (×3): 50 ug via INTRAVENOUS
  Administered 2012-08-09: 100 ug via INTRAVENOUS
  Administered 2012-08-09 (×2): 50 ug via INTRAVENOUS

## 2012-08-09 MED ORDER — DROPERIDOL 2.5 MG/ML IJ SOLN
INTRAMUSCULAR | Status: AC
  Filled 2012-08-09: qty 2

## 2012-08-09 MED ORDER — GLYCOPYRROLATE 0.2 MG/ML IJ SOLN
INTRAMUSCULAR | Status: AC
  Filled 2012-08-09: qty 3

## 2012-08-09 MED ORDER — LIDOCAINE HCL (CARDIAC) 20 MG/ML IV SOLN
INTRAVENOUS | Status: DC | PRN
  Administered 2012-08-09: 30 mg via INTRAVENOUS

## 2012-08-09 MED ORDER — DEXAMETHASONE SODIUM PHOSPHATE 4 MG/ML IJ SOLN
4.0000 mg | Freq: Once | INTRAMUSCULAR | Status: AC
  Administered 2012-08-09: 4 mg via INTRAVENOUS

## 2012-08-09 MED ORDER — DEXAMETHASONE SODIUM PHOSPHATE 4 MG/ML IJ SOLN
INTRAMUSCULAR | Status: AC
  Filled 2012-08-09: qty 1

## 2012-08-09 MED ORDER — FENTANYL CITRATE 0.05 MG/ML IJ SOLN
25.0000 ug | INTRAMUSCULAR | Status: DC | PRN
  Administered 2012-08-09: 25 ug via INTRAVENOUS

## 2012-08-09 MED ORDER — ONDANSETRON HCL 4 MG/2ML IJ SOLN
INTRAMUSCULAR | Status: AC
  Filled 2012-08-09: qty 2

## 2012-08-09 MED ORDER — LACTATED RINGERS IV SOLN
INTRAVENOUS | Status: DC
  Administered 2012-08-09: 12:00:00 via INTRAVENOUS
  Administered 2012-08-09: 1000 mL via INTRAVENOUS

## 2012-08-09 MED ORDER — STERILE WATER FOR IRRIGATION IR SOLN
Status: DC | PRN
  Administered 2012-08-09: 1000 mL

## 2012-08-09 MED ORDER — 0.9 % SODIUM CHLORIDE (POUR BTL) OPTIME
TOPICAL | Status: DC | PRN
  Administered 2012-08-09: 1000 mL

## 2012-08-09 MED ORDER — GLYCOPYRROLATE 0.2 MG/ML IJ SOLN
INTRAMUSCULAR | Status: AC
  Filled 2012-08-09: qty 1

## 2012-08-09 MED ORDER — MIDAZOLAM HCL 2 MG/2ML IJ SOLN
1.0000 mg | INTRAMUSCULAR | Status: DC | PRN
  Administered 2012-08-09 (×2): 2 mg via INTRAVENOUS

## 2012-08-09 MED ORDER — PROPOFOL 10 MG/ML IV EMUL
INTRAVENOUS | Status: AC
  Filled 2012-08-09: qty 20

## 2012-08-09 MED ORDER — SODIUM CHLORIDE 0.9 % IR SOLN
Status: DC | PRN
  Administered 2012-08-09: 3000 mL

## 2012-08-09 MED ORDER — CEFAZOLIN SODIUM-DEXTROSE 2-3 GM-% IV SOLR
INTRAVENOUS | Status: AC
  Filled 2012-08-09: qty 50

## 2012-08-09 MED ORDER — BUPIVACAINE-EPINEPHRINE PF 0.5-1:200000 % IJ SOLN
INTRAMUSCULAR | Status: AC
  Filled 2012-08-09: qty 10

## 2012-08-09 MED ORDER — ROCURONIUM BROMIDE 50 MG/5ML IV SOLN
INTRAVENOUS | Status: AC
  Filled 2012-08-09: qty 1

## 2012-08-09 MED ORDER — ONDANSETRON HCL 4 MG/2ML IJ SOLN
4.0000 mg | Freq: Once | INTRAMUSCULAR | Status: AC
  Administered 2012-08-09: 4 mg via INTRAVENOUS

## 2012-08-09 SURGICAL SUPPLY — 51 items
BAG HAMPER (MISCELLANEOUS) ×3 IMPLANT
BLADE SURG SZ11 CARB STEEL (BLADE) ×3 IMPLANT
CLOTH BEACON ORANGE TIMEOUT ST (SAFETY) ×3 IMPLANT
COVER LIGHT HANDLE STERIS (MISCELLANEOUS) ×6 IMPLANT
DECANTER SPIKE VIAL GLASS SM (MISCELLANEOUS) ×3 IMPLANT
DRAPE PROXIMA HALF (DRAPES) ×3 IMPLANT
DRAPE STERI URO 9X17 APER PCH (DRAPES) ×3 IMPLANT
ELECT REM PT RETURN 9FT ADLT (ELECTROSURGICAL) ×3
ELECTRODE REM PT RTRN 9FT ADLT (ELECTROSURGICAL) ×2 IMPLANT
FORMALIN 10 PREFIL 120ML (MISCELLANEOUS) ×3 IMPLANT
GLOVE BIOGEL PI IND STRL 7.0 (GLOVE) ×8 IMPLANT
GLOVE BIOGEL PI IND STRL 8 (GLOVE) ×4 IMPLANT
GLOVE BIOGEL PI INDICATOR 7.0 (GLOVE) ×4
GLOVE BIOGEL PI INDICATOR 8 (GLOVE) ×2
GLOVE ECLIPSE 6.5 STRL STRAW (GLOVE) ×12 IMPLANT
GLOVE ECLIPSE 8.0 STRL XLNG CF (GLOVE) ×6 IMPLANT
GLOVE SS BIOGEL STRL SZ 6.5 (GLOVE) ×2 IMPLANT
GLOVE SUPERSENSE BIOGEL SZ 6.5 (GLOVE) ×1
GOWN STRL REIN XL XLG (GOWN DISPOSABLE) ×18 IMPLANT
INST SET LAPROSCOPIC GYN AP (KITS) ×3 IMPLANT
IV NS IRRIG 3000ML ARTHROMATIC (IV SOLUTION) ×3 IMPLANT
KIT ROOM TURNOVER APOR (KITS) ×3 IMPLANT
KIT TROCAR LAP GYN (TROCAR) ×3 IMPLANT
MANIFOLD NEPTUNE II (INSTRUMENTS) ×3 IMPLANT
NEEDLE HYPO 25X1 1.5 SAFETY (NEEDLE) ×6 IMPLANT
NEEDLE INSUFFLATION 14GA 120MM (NEEDLE) ×3 IMPLANT
NEEDLE MAYO 1/2 CIRCLE (NEEDLE) ×6 IMPLANT
NS IRRIG 1000ML POUR BTL (IV SOLUTION) ×3 IMPLANT
PACK PERI GYN (CUSTOM PROCEDURE TRAY) ×3 IMPLANT
PAD ARMBOARD 7.5X6 YLW CONV (MISCELLANEOUS) ×3 IMPLANT
SCALPEL HARMONIC ACE (MISCELLANEOUS) ×3 IMPLANT
SET BASIN LINEN APH (SET/KITS/TRAYS/PACK) ×3 IMPLANT
SET TUBE IRRIG SUCTION NO TIP (IRRIGATION / IRRIGATOR) ×3 IMPLANT
SOLUTION ANTI FOG 6CC (MISCELLANEOUS) ×3 IMPLANT
SPONGE GAUZE 2X2 8PLY STRL LF (GAUZE/BANDAGES/DRESSINGS) ×6 IMPLANT
SPONGE LAP 18X18 X RAY DECT (DISPOSABLE) ×3 IMPLANT
STAPLER VISISTAT 35W (STAPLE) ×3 IMPLANT
SUT MON AB 3-0 SH 27 (SUTURE) ×3 IMPLANT
SUT VIC AB 0 CT1 27 (SUTURE) ×1
SUT VIC AB 0 CT1 27XCR 8 STRN (SUTURE) ×2 IMPLANT
SUT VICRYL 0 UR6 27IN ABS (SUTURE) ×3 IMPLANT
SYR CONTROL 10ML LL (SYRINGE) ×3 IMPLANT
SYRINGE 10CC LL (SYRINGE) ×3 IMPLANT
TAPE CLOTH SURG 4X10 WHT LF (GAUZE/BANDAGES/DRESSINGS) ×3 IMPLANT
TOWEL OR 17X26 4PK STRL BLUE (TOWEL DISPOSABLE) ×3 IMPLANT
TRAY FOLEY CATH 14FR (SET/KITS/TRAYS/PACK) ×3 IMPLANT
TUBING INSUFFLATION HIGH FLOW (TUBING) ×3 IMPLANT
TUBING SMOKE EVAC CO2 (TUBING) ×3 IMPLANT
WARMER LAPAROSCOPE (MISCELLANEOUS) ×3 IMPLANT
WATER STERILE IRR 1000ML POUR (IV SOLUTION) ×3 IMPLANT
YANKAUER SUCT BULB TIP NO VENT (SUCTIONS) ×3 IMPLANT

## 2012-08-09 NOTE — Anesthesia Preprocedure Evaluation (Signed)
Anesthesia Evaluation  Patient identified by MRN, date of birth, ID band Patient awake    Reviewed: Allergy & Precautions, H&P , NPO status , Patient's Chart, lab work & pertinent test results, reviewed documented beta blocker date and time   History of Anesthesia Complications (+) PONV  Airway Mallampati: I TM Distance: >3 FB     Dental  (+) Teeth Intact   Pulmonary neg pulmonary ROS, former smoker,  breath sounds clear to auscultation        Cardiovascular + dysrhythmias + Valvular Problems/Murmurs MVP Rhythm:Regular Rate:Normal     Neuro/Psych  Headaches,    GI/Hepatic negative GI ROS,   Endo/Other    Renal/GU      Musculoskeletal   Abdominal   Peds  Hematology   Anesthesia Other Findings   Reproductive/Obstetrics                           Anesthesia Physical Anesthesia Plan  ASA: II  Anesthesia Plan: General   Post-op Pain Management:    Induction: Intravenous  Airway Management Planned: Oral ETT  Additional Equipment:   Intra-op Plan:   Post-operative Plan: Extubation in OR  Informed Consent: I have reviewed the patients History and Physical, chart, labs and discussed the procedure including the risks, benefits and alternatives for the proposed anesthesia with the patient or authorized representative who has indicated his/her understanding and acceptance.     Plan Discussed with:   Anesthesia Plan Comments:         Anesthesia Quick Evaluation

## 2012-08-09 NOTE — H&P (Signed)
Tara Mahoney is an 27 y.o. female. Status post Caesarean section hysterectomy on 01/26/2012 due to persistent bleeding from a posterior placenta previa, ?accreta, had 2u PRBC as well.  Intraoperative and post operative course completely unremarkable.  However, [name] has developed severe bump dyspareunia and bleeding with intercourse.  She has some bleeding at other times as well.  Sonogram is normal and reveals about 3 cm of cervix with no fluid accumulation of other pelvic pathology.  Both ovaries are normal.  As a result i am going to do a laparoscopy, then a trachelectomy for management of all problems.  i really am not sure what the source of her pain and bleeding are.  The patient is aware of the uncertainty.     Past Medical History  Diagnosis Date  . MVP (mitral valve prolapse)   . Palpitations   . Hyperlipidemia   . Headache   . Heart murmur   . Complication of anesthesia   . PONV (postoperative nausea and vomiting)   . Dysrhythmia     sinus arrythmia  . Family history of anesthesia complication     PONV    Past Surgical History  Procedure Date  . Cervical cerclage 2008  . Myringotomy   . Cervical cerclage 09/08/2011    Procedure: CERCLAGE CERVICAL;  Surgeon: Lazaro Arms, MD;  Location: [facility];  Service: Gynecology;  Laterality: N/A;  [name],CST-scrubbed; doctor scrubbed in @ [phone]  . Supraclavical abdominal hysterectomy 01/26/2012    Procedure: HYSTERECTOMY SUPRACERVICAL ABDOMINAL;  Surgeon: Lazaro Arms, MD;  Location: [facility];  Service: Gynecology;;  converted [phone]    Family History  Problem Relation Age of Onset  . Hypertension Brother   . Heart disease Brother     hole in heart  . Anesthesia problems Neg Hx   . Hypotension Neg Hx   . Pseudochol deficiency Neg Hx   . Malignant hyperthermia Neg Hx   . Other Neg Hx     Social History:  reports that she quit smoking about 2 years ago. She has never used smokeless tobacco. She reports that she does not  drink alcohol or use illicit drugs.  Allergies:  Allergies  Allergen Reactions  . Hydrocodone-Acetaminophen Itching and Nausea And Vomiting    Prescriptions prior to admission  Medication Sig Dispense Refill  . metoprolol tartrate (LOPRESSOR) 25 MG tablet Take 25 mg by mouth 2 (two) times daily.        . Prenatal Vit-Fe Fumarate-FA (PRENATAL MULTIVITAMIN) TABS Take 1 tablet by mouth daily.      . calcium carbonate (TUMS - DOSED IN MG ELEMENTAL CALCIUM) 500 MG chewable tablet Chew 1 tablet by mouth daily as needed. For indigestion      . senna-docusate (SENOKOT-S) 8.6-50 MG per tablet Take 2 tablets by mouth at bedtime.  30 tablet  3    ROS  Review of Systems  Constitutional: Negative for fever, chills, weight loss, malaise/fatigue and diaphoresis.  HENT: Negative for hearing loss, ear pain, nosebleeds, congestion, sore throat, neck pain, tinnitus and ear discharge.   Eyes: Negative for blurred vision, double vision, photophobia, pain, discharge and redness.  Respiratory: Negative for cough, hemoptysis, sputum production, shortness of breath, wheezing and stridor.   Cardiovascular: Negative for chest pain, palpitations, orthopnea, claudication, leg swelling and PND.  Gastrointestinal: Positive for abdominal pain. Negative for heartburn, nausea, vomiting, diarrhea, constipation, blood in stool and melena.  Genitourinary: Negative for dysuria, urgency, frequency, hematuria and flank pain.  Musculoskeletal: Negative  for myalgias, back pain, joint pain and falls.  Skin: Negative for itching and rash.  Neurological: Negative for dizziness, tingling, tremors, sensory change, speech change, focal weakness, seizures, loss of consciousness, weakness and headaches.  Endo/Heme/Allergies: Negative for environmental allergies and polydipsia. Does not bruise/bleed easily.  Psychiatric/Behavioral: Negative for depression, suicidal ideas, hallucinations, memory loss and substance abuse. The patient is  not nervous/anxious and does not have insomnia.     Blood pressure 120/70, pulse 68, temperature 98.2 F (36.8 C), temperature source Oral, resp. rate 20, SpO2 100.00%, not currently breastfeeding. Physical Exam Physical Exam  Vitals reviewed. Constitutional: She is oriented to person, place, and time. She appears well-developed and well-nourished.  HENT:  Head: Normocephalic and atraumatic.  Right Ear: External ear normal.  Left Ear: External ear normal.  Nose: Nose normal.  Mouth/Throat: Oropharynx is clear and moist.  Eyes: Conjunctivae and EOM are normal. Pupils are equal, round, and reactive to light. Right eye exhibits no discharge. Left eye exhibits no discharge. No scleral icterus.  Neck: Normal range of motion. Neck supple. No tracheal deviation present. No thyromegaly present.  Cardiovascular: Normal rate, regular rhythm, normal heart sounds and intact distal pulses.  Exam reveals no gallop and no friction rub.   No murmur heard. Respiratory: Effort normal and breath sounds normal. No respiratory distress. She has no wheezes. She has no rales. She exhibits no tenderness.  GI: Soft. Bowel sounds are normal. She exhibits no distension and no mass. There is tenderness. There is no rebound and no guarding.  Genitourinary:       Vulva is normal without lesions Vagina is pink moist without discharge Cervix normal in appearance and pap is normal Uterus is absent Adnexa is negative with normal sized ovaries by sonogram  Musculoskeletal: Normal range of motion. She exhibits no edema and no tenderness.  Neurological: She is alert and oriented to person, place, and time. She has normal reflexes. She displays normal reflexes. No cranial nerve deficit. She exhibits normal muscle tone. Coordination normal.  Skin: Skin is warm and dry. No rash noted. No erythema. No pallor.  Psychiatric: She has a normal mood and affect. Her behavior is normal. Judgment and thought content normal.      Recent Results (from the past 336 hour(s))  CBC   Collection Time   08/03/12 10:50 AM      Component Value Range   WBC 4.7  4.0 - 10.5 K/uL   RBC 4.69  3.87 - 5.11 MIL/uL   Hemoglobin 14.7  12.0 - 15.0 g/dL   HCT 16.1  09.6 - 04.5 %   MCV 88.3  78.0 - 100.0 fL   MCH 31.3  26.0 - 34.0 pg   MCHC 35.5  30.0 - 36.0 g/dL   RDW 40.9  81.1 - 91.4 %   Platelets 127 (*) 150 - 400 K/uL  COMPREHENSIVE METABOLIC PANEL   Collection Time   08/03/12 10:50 AM      Component Value Range   Sodium 138  135 - 145 mEq/L   Potassium 4.1  3.5 - 5.1 mEq/L   Chloride 102  96 - 112 mEq/L   CO2 25  19 - 32 mEq/L   Glucose, Bld 76  70 - 99 mg/dL   BUN 16  6 - 23 mg/dL   Creatinine, Ser 7.82  0.50 - 1.10 mg/dL   Calcium 95.6  8.4 - 21.3 mg/dL   Total Protein 7.3  6.0 - 8.3 g/dL   Albumin 4.6  3.5 - 5.2 g/dL   AST 15  0 - 37 U/L   ALT 11  0 - 35 U/L   Alkaline Phosphatase 51  39 - 117 U/L   Total Bilirubin 0.5  0.3 - 1.2 mg/dL   GFR calc non Af Amer 85 (*) >90 mL/min   GFR calc Af Amer >90  >90 mL/min  HCG, QUANTITATIVE, PREGNANCY   Collection Time   08/03/12 10:50 AM      Component Value Range   hCG, Beta Chain, Quant, S <1  <5 mIU/mL  TYPE AND SCREEN   Collection Time   08/03/12 10:50 AM      Component Value Range   ABO/RH(D) A POS     Antibody Screen NEG     Sample Expiration 08/17/2012    URINALYSIS, ROUTINE W REFLEX MICROSCOPIC   Collection Time   08/03/12 10:50 AM      Component Value Range   Color, Urine YELLOW  YELLOW   APPearance CLEAR  CLEAR   Specific Gravity, Urine 1.020  1.005 - 1.030   pH 6.0  5.0 - 8.0   Glucose, UA NEGATIVE  NEGATIVE mg/dL   Hgb urine dipstick TRACE (*) NEGATIVE   Bilirubin Urine NEGATIVE  NEGATIVE   Ketones, ur NEGATIVE  NEGATIVE mg/dL   Protein, ur NEGATIVE  NEGATIVE mg/dL   Urobilinogen, UA 0.2  0.0 - 1.0 mg/dL   Nitrite NEGATIVE  NEGATIVE   Leukocytes, UA TRACE (*) NEGATIVE  URINE MICROSCOPIC-ADD ON   Collection Time   08/03/12 10:50 AM       Component Value Range   Squamous Epithelial / LPF FEW (*) RARE   WBC, UA 0-2  <3 WBC/hpf   RBC / HPF 0-2  <3 RBC/hpf   Bacteria, UA FEW (*) RARE  SURGICAL PCR SCREEN   Collection Time   08/03/12 10:50 AM      Component Value Range   MRSA, PCR NEGATIVE  NEGATIVE   Staphylococcus aureus POSITIVE (*) NEGATIVE       Assessment/Plan: 1.  Dysfunctional bleeding after caesarean hysterectomy, supracervical 2.  Dyspareunia 3.  Bleeding with intercourse   Will proceed with diagnostic laparoscopy with indicated procedures and trachelectomy, vaginal approach. Pt understands the risks of surgery including but not limited t  excessive bleeding requiring transfusion or reoperation, post-operative infection requiring prolonged hospitalization or re-hospitalization and antibiotic therapy, and damage to other organs including bladder, bowel, ureters and major vessels.  The patient also understands the alternative treatment options which were discussed in full.  All questions were answered.   EURE,LUTHER H 08/09/2012, 10:48 AM

## 2012-08-09 NOTE — Anesthesia Procedure Notes (Signed)
Procedure Name: Intubation Date/Time: 08/09/2012 11:32 AM Performed by: Glynn Octave E Pre-anesthesia Checklist: Patient identified, Patient being monitored, Timeout performed, Emergency Drugs available and Suction available Patient Re-evaluated:Patient Re-evaluated prior to inductionOxygen Delivery Method: Circle System Utilized Preoxygenation: Pre-oxygenation with 100% oxygen Intubation Type: IV induction Ventilation: Mask ventilation without difficulty Laryngoscope Size: Mac and 3 Grade View: Grade I Tube type: Oral Tube size: 7.0 mm Number of attempts: 1 Airway Equipment and Method: stylet Placement Confirmation: ETT inserted through vocal cords under direct vision,  positive ETCO2 and breath sounds checked- equal and bilateral Secured at: 21 cm Tube secured with: Tape Dental Injury: Teeth and Oropharynx as per pre-operative assessment

## 2012-08-09 NOTE — Transfer of Care (Signed)
Immediate Anesthesia Transfer of Care Note  Patient: Tara Mahoney  Procedure(s) Performed: Procedure(s) (LRB): LAPAROSCOPY DIAGNOSTIC (N/A)  Patient Location: PACU  Anesthesia Type: General  Level of Consciousness: awake  Airway & Oxygen Therapy: Patient Spontanous Breathing and non-rebreather face mask  Post-op Assessment: Report given to PACU RN, Post -op Vital signs reviewed and stable and Patient moving all extremities  Post vital signs: Reviewed and stable  Complications: No apparent anesthesia complications

## 2012-08-10 NOTE — Anesthesia Postprocedure Evaluation (Signed)
  Anesthesia Post-op Note  Patient: Tara Mahoney  Procedure(s) Performed: Procedure(s) (LRB) with comments: LAPAROSCOPY DIAGNOSTIC (N/A) - started at  1208-Vaginal trachelectomy (vaginal removal of cervical stump)  LAPAROSCOPIC LYSIS OF ADHESIONS (N/A)  Patient Location: PACU  Anesthesia Type:General  Level of Consciousness: awake, alert  and oriented  Airway and Oxygen Therapy: Patient Spontanous Breathing  Post-op Pain: mild  Post-op Assessment: Post-op Vital signs reviewed, PATIENT'S CARDIOVASCULAR STATUS UNSTABLE and RESPIRATORY FUNCTION UNSTABLE  Post-op Vital Signs: Reviewed and stable  Complications: No apparent anesthesia complications  Late entry for post visit on 08/09/2012 1400 by T.Rande Roylance CRNA

## 2012-08-11 ENCOUNTER — Encounter (HOSPITAL_COMMUNITY): Payer: Self-pay | Admitting: Obstetrics & Gynecology

## 2012-09-14 NOTE — Op Note (Signed)
Preoperative diagnosis:  1.  Dysfunctional uterine bleeding                                          2.  post coital bleeding                                         3.  pelvic pain                                         4.  status post cesarean supracervical hysterectomy 6 months earlier  Postoperative diagnosis:  Same as above  Procedure:  1.  Diagnostic laparoscopy                      2.  vaginal removal of cervical stump or trachelectomy   Surgeon:  Lazaro Arms  Anesthesia:  Gen. Endotracheal  Findings:  For the past several months the patient has presented to the office complaining of postcoital bleeding dyspareunia and dysfunctional uterine bleeding.  On exam I seen nothing no vaginal laceration cervical problem.  She has no cervical motion tenderness except with extreme motion anteriorly.  Abdomen ultrasound of the cervical stump in the office and there is no collection of fluid or blood at the top of the stump and there is no blood seen in the endocervix.  Hours really at a loss for the source of the bleeding preoperatively and intraoperatively really had no better ideas.  The laparoscopy was completely normal surprisingly so for a cesarean hysterectomy with the only adhesion being of a rectal epiploicae to the stump adhesion which was very minor and taken down easily.  Again there were no intraperitoneal abnormalities no fluid collection no blood no evidence of infection the cervix appeared completely normal.  The ovaries were also normal.  Description of operation:  Patient was taken to the operating room placed in supine position where she underwent general tracheal anesthesia.  She was then placed in the dorsal lithotomy position prepped and draped in usual sterile fashion for both a laparoscopy and a vaginal procedure.  An incision was made in the umbilicus and a varies needle was placed in the peritoneal cavity one pass that difficulty.  A non-bladed direct video laparoscope trocar  was placed into the peritoneal cavity with one pass that difficulty.  2 additional 5 mm trochars were then placed in the suprapubic and left lower quadrant areas without difficulty under direct visualization.  The ovaries were normal mobile nonadherent.  There was a little bit of omental adhesion to the anterior bowel wall which was taken down easily.  There was a sigmoid rectal epiploica wall off fat that was stuck to the cervical stump and a very thin adhesion again taken down very easily.  A left laparoscopic equipment and and turned my attention to the removal of the cervical stump.  Circumferential incision was made about the cervix and the anterior posterior vagina pushed off the cervix without difficulty.  Posterior cul-de-sac was entered sharply.  The uterosacral ligature clamped cut and suture ligated bilaterally.  The cardinal ligaments were clamped cut and suture ligated bilaterally.  The anterior peritoneum was identified and entered 1 additional  clamp was needed bilaterally to remove the cervical stump.  The pedicle was tied off the vagina was closed anterior to posterior clearing the vagina and the peritoneum in a side-to-side fashion was excellent clot anatomical closure.  The cervix was quite small like to do half centimeters.  I then went when backup after removing gloves then looked laparoscopically and there was no bleeding and no abnormalities pelvis is irrigated this was removed the skin was closed using skin staples after the umbilical fascia was closed using 0 Vicryl.  Postop should well she experienced minimal blood loss taken covering good stable condition all counts correct x3 and she received preoperative prophylactic antibiotics.  Johnda Billiot H 12:30 AM 09/14/2012

## 2012-10-11 ENCOUNTER — Telehealth: Payer: Self-pay | Admitting: Obstetrics & Gynecology

## 2012-10-11 NOTE — Telephone Encounter (Signed)
Pt wants to return to work on Friday, March 28. Needs ok from you.

## 2012-11-07 NOTE — Progress Notes (Signed)
Patient ID: Tara Mahoney, female   DOB: 1986-04-12, 27 y.o.   MRN: [id number] Cancelled appt

## 2013-02-20 ENCOUNTER — Encounter: Payer: Self-pay | Admitting: Obstetrics & Gynecology

## 2013-02-20 ENCOUNTER — Ambulatory Visit (INDEPENDENT_AMBULATORY_CARE_PROVIDER_SITE_OTHER): Payer: Medicaid Other | Admitting: Obstetrics & Gynecology

## 2013-02-20 VITALS — BP 110/80 | Ht 63.0 in | Wt 118.0 lb

## 2013-02-20 DIAGNOSIS — R1032 Left lower quadrant pain: Secondary | ICD-10-CM

## 2013-02-20 MED ORDER — HYOSCYAMINE SULFATE ER 0.375 MG PO TB12: 0.3750 mg | ORAL_TABLET | Freq: Two times a day (BID) | ORAL | Status: DC | PRN

## 2013-02-20 NOTE — Progress Notes (Signed)
Patient ID: Tara Mahoney, female   DOB: Mar 25, 1986, 27 y.o.   MRN: [id number] Tara Mahoney is in for evaluation of a 2 month history of left lower quadrant pain, and it really is in the space be much on the umbilical side of the line ago showed the umbilicus to the iliac crest She states her bowel movements of also changing a 3-4 day She has discomfort on the left side with intercourse which she has infrequently She is not change her dietary had or any medications   of course Cattie is well known from her cesarean hysterectomy and subsequent trachelectomy  Exam Gassy changes on exam with really palpable bowel She states the area is full of the normal expansive contracts consistent with gas  Pelvic Discomfort in the left side but no masses palpable Cuff intact nontender Right adnexa is negative  Impression Possible irritable bowel  Plan Pelvic sonogram to evaluate left ovary and adnexa Levbid  Followup in 3 weeks

## 2013-02-20 NOTE — Patient Instructions (Signed)
Abdominal Pain (Nonspecific)  Your exam might not show the exact reason you have abdominal pain. Since there are many different causes of abdominal pain, another checkup and more tests may be needed. It is very important to follow up for lasting (persistent) or worsening symptoms. A possible cause of abdominal pain in any person who still has his or her appendix is acute appendicitis. Appendicitis is often hard to diagnose. Normal blood tests, urine tests, ultrasound, and CT scans do not completely rule out early appendicitis or other causes of abdominal pain. Sometimes, only the changes that happen over time will allow appendicitis and other causes of abdominal pain to be determined. Other potential problems that may require surgery may also take time to become more apparent. Because of this, it is important that you follow all of the instructions below.  HOME CARE INSTRUCTIONS    Rest as much as possible.   Do not eat solid food until your pain is gone.   While adults or children have pain: A diet of water, weak decaffeinated tea, broth or bouillon, gelatin, oral rehydration solutions (ORS), frozen ice pops, or ice chips may be helpful.   When pain is gone in adults or children: Start a light diet (dry toast, crackers, applesauce, or white rice). Increase the diet slowly as long as it does not bother you. Eat no dairy products (including cheese and eggs) and no spicy, fatty, fried, or high-fiber foods.   Use no alcohol, caffeine, or cigarettes.   Take your regular medicines unless your caregiver told you not to.   Take any prescribed medicine as directed.   Only take over-the-counter or prescription medicines for pain, discomfort, or fever as directed by your caregiver. Do not give aspirin to children.  If your caregiver has given you a follow-up appointment, it is very important to keep that appointment. Not keeping the appointment could result in a permanent injury and/or lasting (chronic) pain  and/or disability. If there is any problem keeping the appointment, you must call to reschedule.   SEEK IMMEDIATE MEDICAL CARE IF:    Your pain is not gone in 24 hours.   Your pain becomes worse, changes location, or feels different.   You or your child has an oral temperature above 102 F (38.9 C), not controlled by medicine.   Your baby is older than 3 months with a rectal temperature of 102 F (38.9 C) or higher.   Your baby is 3 months old or younger with a rectal temperature of 100.4 F (38 C) or higher.   You have shaking chills.   You keep throwing up (vomiting) or cannot drink liquids.   There is blood in your vomit or you see blood in your bowel movements.   Your bowel movements become dark or black.   You have frequent bowel movements.   Your bowel movements stop (become blocked) or you cannot pass gas.   You have bloody, frequent, or painful urination.   You have yellow discoloration in the skin or whites of the eyes.   Your stomach becomes bloated or bigger.   You have dizziness or fainting.   You have chest or back pain.  MAKE SURE YOU:    Understand these instructions.   Will watch your condition.   Will get help right away if you are not doing well or get worse.  Document Released: 07/05/2005 Document Revised: 09/27/2011 Document Reviewed: 06/02/2009  [employer] Patient Information 2014 [employer]

## 2013-03-13 ENCOUNTER — Encounter: Payer: Self-pay | Admitting: Obstetrics & Gynecology

## 2013-03-13 ENCOUNTER — Ambulatory Visit (INDEPENDENT_AMBULATORY_CARE_PROVIDER_SITE_OTHER): Payer: Medicaid Other

## 2013-03-13 ENCOUNTER — Other Ambulatory Visit: Payer: Self-pay | Admitting: Obstetrics & Gynecology

## 2013-03-13 ENCOUNTER — Ambulatory Visit (INDEPENDENT_AMBULATORY_CARE_PROVIDER_SITE_OTHER): Payer: Medicaid Other | Admitting: Obstetrics & Gynecology

## 2013-03-13 VITALS — BP 100/60 | Wt 121.0 lb

## 2013-03-13 DIAGNOSIS — R1032 Left lower quadrant pain: Secondary | ICD-10-CM

## 2013-03-13 DIAGNOSIS — N949 Unspecified condition associated with female genital organs and menstrual cycle: Secondary | ICD-10-CM

## 2013-03-21 NOTE — Progress Notes (Signed)
Patient ID: Tara Mahoney, female   DOB: 10-22-1985, 27 y.o.   MRN: [id number] The area in LLQ more and more seems to be musculoskeletal vs neurogenic by the description timing quality and character of the pain  As a result injected 10cc total of 0.5% marcaine with instant calming of pain from 8 to 3 or 4.  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

## 2013-03-26 ENCOUNTER — Ambulatory Visit: Payer: Medicaid Other | Admitting: Obstetrics & Gynecology

## 2013-04-06 ENCOUNTER — Encounter: Payer: Self-pay | Admitting: Obstetrics & Gynecology

## 2013-04-06 ENCOUNTER — Ambulatory Visit (INDEPENDENT_AMBULATORY_CARE_PROVIDER_SITE_OTHER): Payer: Medicaid Other | Admitting: Obstetrics & Gynecology

## 2013-04-06 VITALS — BP 116/72 | Ht 63.0 in | Wt 122.0 lb

## 2013-04-06 DIAGNOSIS — R1032 Left lower quadrant pain: Secondary | ICD-10-CM

## 2013-04-06 NOTE — Progress Notes (Signed)
Patient ID: Khaliyah Northrop, female   DOB: 12-11-1985, 27 y.o.   MRN: [id number] Progress Notes Rashida Ladouceur (MR# [id number])         Progress Notes Info    Author Note Status Last Update User Last Update Date/Time   Lazaro Arms, MD Signed Lazaro Arms, MD 03/21/2013 8:59 AM         Progress Notes    Patient ID: Corliss Marcus, female DOB: 12-23-1985, 27 y.o. MRN: [id number]  The area in LLQ more and more seems to be musculoskeletal vs neurogenic by the description timing quality and character of the pain  As a result injected 10cc total of 0.5% marcaine with instant calming of pain from 8 to 3 or 4.  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

## 2013-04-11 ENCOUNTER — Ambulatory Visit (INDEPENDENT_AMBULATORY_CARE_PROVIDER_SITE_OTHER): Payer: Medicaid Other | Admitting: Cardiology

## 2013-04-11 ENCOUNTER — Encounter: Payer: Self-pay | Admitting: Cardiology

## 2013-04-11 VITALS — BP 115/78 | HR 87 | Ht 63.0 in | Wt 121.0 lb

## 2013-04-11 DIAGNOSIS — R0602 Shortness of breath: Secondary | ICD-10-CM

## 2013-04-11 DIAGNOSIS — R002 Palpitations: Secondary | ICD-10-CM

## 2013-04-11 DIAGNOSIS — I10 Essential (primary) hypertension: Secondary | ICD-10-CM

## 2013-04-11 DIAGNOSIS — I059 Rheumatic mitral valve disease, unspecified: Secondary | ICD-10-CM

## 2013-04-11 LAB — COMPREHENSIVE METABOLIC PANEL
ALT: 8 U/L (ref 0–35)
AST: 14 U/L (ref 0–37)
Alkaline Phosphatase: 51 U/L (ref 39–117)
CO2: 27 mEq/L (ref 19–32)
Sodium: 138 mEq/L (ref 135–145)
Total Bilirubin: 0.6 mg/dL (ref 0.3–1.2)
Total Protein: 7.1 g/dL (ref 6.0–8.3)

## 2013-04-11 LAB — TSH: TSH: 1.272 u[IU]/mL (ref 0.350–4.500)

## 2013-04-11 NOTE — Progress Notes (Signed)
Clinical Summary Tara Mahoney is a 27 y.o.female  1. Mitral valve prolapse - initially noted on outside echo by Dr Janna Arch - dont see actual echo report, per Dr Ricki Miller prior clinic note for echo 08/17/10 report describes redundant anterior leaflet w/ mild eccentric MR. The images apparently were on VHS tape and have not been received.  - father side of family w/ history of early MI  - episodes of palpitations for 6 months. Feeling of of hot, sweaty, lightheaded, dizzy w/ blurry vision. Can have the feeling of palpitations, often feels HR and it feels irregular. + SOB. Noting increase in frequency in episodes, occurs several times a day. Can occur at rest or w/ exertion. Feelings of presyncope, no actual syncope - no excessive caffeine. No EtoH. Compliant w/ metoprolol.  - denies any DOE, no orthopnea, no LE edema          Past Medical History  Diagnosis Date  . MVP (mitral valve prolapse)   . Palpitations   . Hyperlipidemia   . Headache(784.0)   . Heart murmur   . Complication of anesthesia   . PONV (postoperative nausea and vomiting)   . Dysrhythmia     sinus arrythmia  . Family history of anesthesia complication     PONV     Allergies  Allergen Reactions  . Hydrocodone-Acetaminophen Itching and Nausea And Vomiting     Current Outpatient Prescriptions  Medication Sig Dispense Refill  . ibuprofen (ADVIL,MOTRIN) 600 MG tablet Take 600 mg by mouth as needed for pain.      . metoprolol tartrate (LOPRESSOR) 25 MG tablet Take 25 mg by mouth 2 (two) times daily.         No current facility-administered medications for this visit.     Past Surgical History  Procedure Laterality Date  . Cervical cerclage  2008  . Myringotomy    . Cervical cerclage  09/08/2011    Procedure: CERCLAGE CERVICAL;  Surgeon: Lazaro Arms, MD;  Location: [facility];  Service: Gynecology;  Laterality: N/A;  Tara Mahoney,CST-scrubbed; doctor scrubbed in @ [phone]  . Supracervical abdominal  hysterectomy  01/26/2012    Procedure: HYSTERECTOMY SUPRACERVICAL ABDOMINAL;  Surgeon: Lazaro Arms, MD;  Location: [facility];  Service: Gynecology;;  converted [phone]  . Laparoscopy  08/09/2012    Procedure: LAPAROSCOPY DIAGNOSTIC;  Surgeon: Lazaro Arms, MD;  Location: [facility];  Service: Gynecology;  Laterality: N/A;  started at  1208-Vaginal trachelectomy (vaginal removal of cervical stump)   . Laparoscopic lysis of adhesions  08/09/2012    Procedure: LAPAROSCOPIC LYSIS OF ADHESIONS;  Surgeon: Lazaro Arms, MD;  Location: [facility];  Service: Gynecology;  Laterality: N/A;     Allergies  Allergen Reactions  . Hydrocodone-Acetaminophen Itching and Nausea And Vomiting      Family History  Problem Relation Age of Onset  . Hypertension Brother   . Heart disease Brother     hole in heart  . Anesthesia problems Neg Hx   . Hypotension Neg Hx   . Pseudochol deficiency Neg Hx   . Malignant hyperthermia Neg Hx   . Other Neg Hx   . Ovarian cancer Mother      Social History Ms. Hosman reports that she quit smoking about 3 years ago. Her smoking use included Cigarettes. She smoked 0.00 packs per day for 1 year. She has never used smokeless tobacco. Ms. Pizana reports that she does not drink alcohol.   Review of Systems 12 point ROS negative  other than reported in HPI  Physical Examination p 87 bp 115/78 Wt 121 lbs BMI 21 Gen: resting comfortably, NAD HEENT: no scleral icterus, pupils equal round and reactive, no palptable cervical adenopathy CV: RRR, faint apiscal systolic murmur, no JVD, no carotid bruits Pulm: CTAB Abd: soft, NT, ND NABS, no hepatosplenomegaly Ext: warm, no edema.  Skin: warm, no rash Neuro: A&Ox3, no focal deficits    Assessment and Plan  1. MVP - will repeat echo to evaluate any progression of regurgitation  2. Palps - will place 48 hr event monitor - continue metoprolol, reports some lightheadedness w/ taking metoprolol 25mg  in AM. She will take 12.5mg  in  AM and 25 mg at night. She seems fairly sensitive to AV nodal agents, pending holter results she could be a potential candidate for ablation if not able to tolerate medical therapy.  - check CMP, TSH, Mg  Antoine Poche, M.D., F.A.C.C.

## 2013-04-11 NOTE — Patient Instructions (Addendum)
Your physician recommends that you schedule a follow-up appointment in: 2 WEEKS  Your physician has requested that you have an echocardiogram. Echocardiography is a painless test that uses sound waves to create images of your heart. It provides your doctor with information about the size and shape of your heart and how well your heart's chambers and valves are working. This procedure takes approximately one hour. There are no restrictions for this procedure.  Your physician has recommended that you wear a holter monitor. Holter monitors are medical devices that record the heart's electrical activity. Doctors most often use these monitors to diagnose arrhythmias. Arrhythmias are problems with the speed or rhythm of the heartbeat. The monitor is a small, portable device. You can wear one while you do your normal daily activities. This is usually used to diagnose what is causing palpitations/syncope (passing out).   Your physician recommends that you return for lab work in: TODAY (SLIPS GIVEN FOR TSH,MAGNESIUM LEVEL, CMP)  WE WILL CALL YOU WITH YOUR TEST RESULTS/INSTRUCTIONS/NEXT STEPS ONCE RECEIVED BY THE PROVIDER

## 2013-04-13 ENCOUNTER — Ambulatory Visit: Payer: Medicaid Other | Admitting: Adult Health

## 2013-04-16 ENCOUNTER — Ambulatory Visit (HOSPITAL_COMMUNITY)
Admission: RE | Admit: 2013-04-16 | Discharge: 2013-04-16 | Disposition: A | Discharge status: Home / Self Care | Payer: Medicaid Other | Source: Ambulatory Visit | Attending: Cardiology | Admitting: Cardiology

## 2013-04-16 DIAGNOSIS — R002 Palpitations: Secondary | ICD-10-CM

## 2013-04-16 DIAGNOSIS — R0989 Other specified symptoms and signs involving the circulatory and respiratory systems: Secondary | ICD-10-CM | POA: Insufficient documentation

## 2013-04-16 DIAGNOSIS — I059 Rheumatic mitral valve disease, unspecified: Secondary | ICD-10-CM | POA: Insufficient documentation

## 2013-04-16 DIAGNOSIS — I10 Essential (primary) hypertension: Secondary | ICD-10-CM

## 2013-04-16 DIAGNOSIS — R0602 Shortness of breath: Secondary | ICD-10-CM

## 2013-04-16 DIAGNOSIS — R0609 Other forms of dyspnea: Secondary | ICD-10-CM | POA: Insufficient documentation

## 2013-04-16 NOTE — Progress Notes (Signed)
*  PRELIMINARY RESULTS* Echocardiogram 2D Echocardiogram has been performed.  Shakinah Navis 04/16/2013, 3:20 PM

## 2013-04-16 NOTE — Progress Notes (Signed)
48 hour Holter Monitor in progress. 

## 2013-04-25 ENCOUNTER — Ambulatory Visit (INDEPENDENT_AMBULATORY_CARE_PROVIDER_SITE_OTHER): Payer: Medicaid Other | Admitting: Cardiology

## 2013-04-25 ENCOUNTER — Encounter: Payer: Self-pay | Admitting: Cardiology

## 2013-04-25 VITALS — BP 130/80 | HR 93 | Ht 63.0 in | Wt 121.0 lb

## 2013-04-25 DIAGNOSIS — R002 Palpitations: Secondary | ICD-10-CM

## 2013-04-25 MED ORDER — METOPROLOL SUCCINATE ER 25 MG PO TB24: 25.0000 mg | ORAL_TABLET | Freq: Every day | ORAL | Status: DC

## 2013-04-25 NOTE — Progress Notes (Signed)
Clinical Summary Tara Mahoney is a 27 y.o.female 1. Mitral valve prolapse  - initially noted on outside echo by Dr Janna Arch  - dont see actual echo report, per Dr Ricki Miller prior clinic note for echo 08/17/10 report describes redundant anterior leaflet w/ mild eccentric MR. The images apparently were on VHS tape and have not been received.  - father side of family w/ history of early MI  - episodes of palpitations for 6 months. Feeling of of hot, sweaty, lightheaded, dizzy w/ blurry vision. Can have the feeling of palpitations, often feels HR and it feels irregular. + SOB. Noting increase in frequency in episodes, occurs several times a day. Can occur at rest or w/ exertion. Feelings of presyncope, no actual syncope  - no excessive caffeine. No EtoH. Compliant w/ metoprolol.  - denies any DOE, no orthopnea, no LE edema   - no change in symptoms since last visit  Past Medical History  Diagnosis Date  . MVP (mitral valve prolapse)   . Palpitations   . Hyperlipidemia   . Headache(784.0)   . Heart murmur   . Complication of anesthesia   . PONV (postoperative nausea and vomiting)   . Dysrhythmia     sinus arrythmia  . Family history of anesthesia complication     PONV     Allergies  Allergen Reactions  . Hydrocodone-Acetaminophen Itching and Nausea And Vomiting     Current Outpatient Prescriptions  Medication Sig Dispense Refill  . ibuprofen (ADVIL,MOTRIN) 600 MG tablet Take 600 mg by mouth as needed for pain.      . metoprolol tartrate (LOPRESSOR) 25 MG tablet Take 25 mg by mouth 2 (two) times daily.         No current facility-administered medications for this visit.     Past Surgical History  Procedure Laterality Date  . Cervical cerclage  2008  . Myringotomy    . Cervical cerclage  09/08/2011    Procedure: CERCLAGE CERVICAL;  Surgeon: Lazaro Arms, MD;  Location: [facility];  Service: Gynecology;  Laterality: N/A;  [name],CST-scrubbed; doctor scrubbed in @  [phone]  . Supracervical abdominal hysterectomy  01/26/2012    Procedure: HYSTERECTOMY SUPRACERVICAL ABDOMINAL;  Surgeon: Lazaro Arms, MD;  Location: [facility];  Service: Gynecology;;  converted [phone]  . Laparoscopy  08/09/2012    Procedure: LAPAROSCOPY DIAGNOSTIC;  Surgeon: Lazaro Arms, MD;  Location: [facility];  Service: Gynecology;  Laterality: N/A;  started at  1208-Vaginal trachelectomy (vaginal removal of cervical stump)   . Laparoscopic lysis of adhesions  08/09/2012    Procedure: LAPAROSCOPIC LYSIS OF ADHESIONS;  Surgeon: Lazaro Arms, MD;  Location: [facility];  Service: Gynecology;  Laterality: N/A;     Allergies  Allergen Reactions  . Hydrocodone-Acetaminophen Itching and Nausea And Vomiting      Family History  Problem Relation Age of Onset  . Hypertension Brother   . Heart disease Brother     hole in heart  . Anesthesia problems Neg Hx   . Hypotension Neg Hx   . Pseudochol deficiency Neg Hx   . Malignant hyperthermia Neg Hx   . Other Neg Hx   . Ovarian cancer Mother      Social History Tara Mahoney reports that she quit smoking about 3 years ago. Her smoking use included Cigarettes. She smoked 0.00 packs per day for 1 year. She has never used smokeless tobacco. Tara Mahoney reports that she does not drink alcohol.   Review  of Systems CONSTITUTIONAL: No weight loss, fever, chills, weakness or fatigue.  HEENT: Eyes: No visual loss, blurred vision, double vision or yellow sclerae.No hearing loss, sneezing, congestion, runny nose or sore throat.  SKIN: No rash or itching.  CARDIOVASCULAR: per HPI RESPIRATORY: No shortness of breath, cough or sputum.  GASTROINTESTINAL: No anorexia, nausea, vomiting or diarrhea. No abdominal pain or blood.  GENITOURINARY: No burning on urination, no polyuria NEUROLOGICAL: No headache, dizziness, syncope, paralysis, ataxia, numbness or tingling in the extremities. No change in bowel or bladder control.  MUSCULOSKELETAL: No muscle, back pain, joint  pain or stiffness.  LYMPHATICS: No enlarged nodes. No history of splenectomy.  PSYCHIATRIC: No history of depression or anxiety.  ENDOCRINOLOGIC: No reports of sweating, cold or heat intolerance. No polyuria or polydipsia.  Marland Kitchen   Physical Examination p 93 bp 130/80 Wt 121 lbs BMI 21 Gen: resting comfortably, no acute distress HEENT: no scleral icterus, pupils equal round and reactive, no palptable cervical adenopathy,  CV: RRR, no m/r/g, no JVD, no carotid bruits Resp: Clear to auscultation bilaterally GI: abdomen is soft, non-tender, non-distended, normal bowel sounds, no hepatosplenomegaly MSK: extremities are warm, no edema.  Skin: warm, no rash Neuro:  no focal deficits Psych: appropriate affect   Diagnostic Studies 03/2013 Echo: LVEF 55-60%, redundant anterior leaflet, mild MR, LA normal size  04/2013 Holter:  Symptoms correlated with SR and SVT. One episode of NSVT 7 beats.   TSH 1.27  Assessment and Plan   1. MVP  - recent echo shows redundant anterior mitral valve leaflets with some prolapse, mild MR. - continue to follow clinically  2. Palpitations - holter showed symptomatic SVT, isolated 7 beat run of NSVT. She has no structural heart disease, overall low burden of ventricular ectopy - reports side effects of fatigue on lopressor - will switch to long acting Toprol XL to see if less symptoms with more steady release and blood levels of medication.        Antoine Poche, M.D., F.A.C.C.

## 2013-04-25 NOTE — Patient Instructions (Addendum)
Your physician recommends that you schedule a follow-up appointment in: 3 months  Your physician has recommended you make the following change in your medication:  1. Stop metoprolol (Lopressor) 25 mg BID 2. Start Toprol XL 25 mg once a day

## 2013-04-30 ENCOUNTER — Encounter: Payer: Self-pay | Admitting: Cardiology

## 2013-04-30 ENCOUNTER — Ambulatory Visit (INDEPENDENT_AMBULATORY_CARE_PROVIDER_SITE_OTHER): Payer: Medicaid Other | Admitting: Cardiology

## 2013-04-30 VITALS — BP 110/71 | HR 90 | Ht 63.0 in | Wt 121.0 lb

## 2013-04-30 DIAGNOSIS — R002 Palpitations: Secondary | ICD-10-CM

## 2013-04-30 MED ORDER — DILTIAZEM HCL ER COATED BEADS 120 MG PO CP24: 120.0000 mg | ORAL_CAPSULE | Freq: Every day | ORAL | Status: DC

## 2013-04-30 NOTE — Patient Instructions (Signed)
Your physician recommends that you schedule a follow-up appointment in: 2 MONTHS  Your physician has recommended you make the following change in your medication:   1) STOP TAKING METOPROLOL 2) START TAKING CARDIZEM 120MG  ONCE DAILY  PLEASE CALL OUR OFFICE IF YOU NOTICE ANY SIDE EFFECTS FROM THIS MEDICATION CHANGE IN 1-2 WEEKS, IF THIS IS NOTED Dr Wyline Mood MAY HAVE YOU SCHEDULED TO SEE DR Ladona Ridgel [phone]

## 2013-04-30 NOTE — Progress Notes (Signed)
Clinical Summary Tara Mahoney is a 27 y.o.female  Tara Mahoney is a 27 y.o.female  1. Palpitations/MVP - episodes of palpitations for 6 months. Feeling of of hot, sweaty, lightheaded, dizzy w/ blurry vision. Can have the feeling of palpitations, often feels HR and it feels irregular. + SOB. Noting increase in frequency in episodes, occurs several times a day. Can occur at rest or w/ exertion. Feelings of presyncope, no actual syncope  - no excessive caffeine. No EtoH. Compliant w/ metoprolol.  - denies any DOE, no orthopnea, no LE edema  - holter shows symptoms correlating to sinus rhythm, SVT. Also had a 7 beat run of non-sustained VT that was not symptomatic. Echo shows no significant structural heart disease other than a redundant anterior leaflet that appears to have some evidence of prolapse.   Prior side effects to lopresso. Tried low dose Toprol XL 25mg  with hopes that more steady release would be more tolerable for her. She started this past Thursday. Noted increased significant fatigue the next day, continued throughout the weekend. No significant benefit with palpitations.   Past Medical History  Diagnosis Date  . MVP (mitral valve prolapse)   . Palpitations   . Hyperlipidemia   . Headache(784.0)   . Heart murmur   . Complication of anesthesia   . PONV (postoperative nausea and vomiting)   . Dysrhythmia     sinus arrythmia  . Family history of anesthesia complication     PONV     Allergies  Allergen Reactions  . Hydrocodone-Acetaminophen Itching and Nausea And Vomiting     Current Outpatient Prescriptions  Medication Sig Dispense Refill  . ibuprofen (ADVIL,MOTRIN) 600 MG tablet Take 600 mg by mouth as needed for pain.      . metoprolol succinate (TOPROL-XL) 25 MG 24 hr tablet Take 1 tablet (25 mg total) by mouth daily.  30 tablet  6   No current facility-administered medications for this visit.     Past Surgical History  Procedure Laterality Date  .  Cervical cerclage  2008  . Myringotomy    . Cervical cerclage  09/08/2011    Procedure: CERCLAGE CERVICAL;  Surgeon: Lazaro Arms, MD;  Location: [facility];  Service: Gynecology;  Laterality: N/A;  [name],CST-scrubbed; doctor scrubbed in @ [phone]  . Supracervical abdominal hysterectomy  01/26/2012    Procedure: HYSTERECTOMY SUPRACERVICAL ABDOMINAL;  Surgeon: Lazaro Arms, MD;  Location: [facility];  Service: Gynecology;;  converted [phone]  . Laparoscopy  08/09/2012    Procedure: LAPAROSCOPY DIAGNOSTIC;  Surgeon: Lazaro Arms, MD;  Location: [facility];  Service: Gynecology;  Laterality: N/A;  started at  1208-Vaginal trachelectomy (vaginal removal of cervical stump)   . Laparoscopic lysis of adhesions  08/09/2012    Procedure: LAPAROSCOPIC LYSIS OF ADHESIONS;  Surgeon: Lazaro Arms, MD;  Location: [facility];  Service: Gynecology;  Laterality: N/A;     Allergies  Allergen Reactions  . Hydrocodone-Acetaminophen Itching and Nausea And Vomiting      Family History  Problem Relation Age of Onset  . Hypertension Brother   . Heart disease Brother     hole in heart  . Anesthesia problems Neg Hx   . Hypotension Neg Hx   . Pseudochol deficiency Neg Hx   . Malignant hyperthermia Neg Hx   . Other Neg Hx   . Ovarian cancer Mother      Social History Ms. Sterba reports that she quit smoking about 3 years ago. Her smoking use  included Cigarettes. She smoked 0.00 packs per day for 1 year. She has never used smokeless tobacco. Ms. Asper reports that she does not drink alcohol.   Review of Systems CONSTITUTIONAL: No weight loss, fever, chills, weakness or fatigue.  HEENT: Eyes: No visual loss, blurred vision, double vision or yellow sclerae.No hearing loss, sneezing, congestion, runny nose or sore throat.  SKIN: No rash or itching.  CARDIOVASCULAR: per HPI RESPIRATORY: Per HPI  GASTROINTESTINAL: No anorexia, nausea, vomiting or diarrhea. No abdominal pain or blood.  GENITOURINARY: No burning on  urination, no polyuria NEUROLOGICAL:Per HPI MUSCULOSKELETAL: No muscle, back pain, joint pain or stiffness.  LYMPHATICS: No enlarged nodes. No history of splenectomy.  PSYCHIATRIC: No history of depression or anxiety.  ENDOCRINOLOGIC: No reports of sweating, cold or heat intolerance. No polyuria or polydipsia.  Marland Kitchen   Physical Examination p 90 bp 110/70 Wt 121 lbs BMI 21 Gen: resting comfortably, no acute distress HEENT: no scleral icterus, pupils equal round and reactive, no palptable cervical adenopathy,  CV: RRR, no m/r/g, no JVD Resp: Clear to auscultation bilaterally GI: abdomen is soft, non-tender, non-distended, normal bowel sounds, no hepatosplenomegaly MSK: extremities are warm, no edema.  Skin: warm, no rash Neuro:  no focal deficits Psych: appropriate affect   Diagnostic Studies 03/2013 Echo: LVEF 55-60%, redundant anterior leaflet, mild MR, LA normal size, mild MR   04/2013 Holter: Symptoms correlated with SR and SVT. One episode of NSVT 7 beats.  TSH 1.27    Assessment and Plan  1. MVP  - recent echo shows redundant anterior mitral valve leaflets with some prolapse, mild MR.  - continue to follow clinically    2. Palpitations  - holter showed symptomatic SVT, isolated 7 beat run of NSVT. She has no structural heart disease, overall low burden of ventricular ectopy  - reports side effects of fatigue on lopressor and Toprol XL - will try switching to low dose diltiazem to see if this is better tolerated. If intolerant to this medication, will refer to EP.     Antoine Poche, M.D., F.A.C.C.

## 2013-05-01 ENCOUNTER — Telehealth: Payer: Self-pay | Admitting: Cardiology

## 2013-05-01 NOTE — Telephone Encounter (Signed)
Received fax refill request  Rx #[id number] Medication:  Diltiazem 120mg  Qty 90 Sig:  Take one capsule by mouth daily Physician:  Dina Rich  **She has Medicaid and it will only pay for the Cardizem LA 120mg .  Can we change her to that?

## 2013-05-02 MED ORDER — DILTIAZEM HCL ER COATED BEADS 120 MG PO TB24: 120.0000 mg | ORAL_TABLET | Freq: Every day | ORAL | Status: DC

## 2013-05-02 NOTE — Telephone Encounter (Signed)
Please advise if this can be changed per this nurse was informed the formulary change for all Medicaid pt was initiated 12-02-12 to only pay for Cardizem LA

## 2013-05-03 ENCOUNTER — Telehealth: Payer: Self-pay | Admitting: *Deleted

## 2013-05-03 NOTE — Telephone Encounter (Signed)
Please have patient stop taking diltiazem. She has symptomatic SVT and cannot tolerate either beta blockers or calcium channel blockers due to side effects. Please refer her to Dr Ladona Ridgel in EP clinic for evaluation of symptomatic SVT. I had talked to her that if side effects were too great we would need to do this.

## 2013-05-03 NOTE — Telephone Encounter (Signed)
Pt states she is breaking out with rash 45 minutes after taking Cardizem since she started about three days ago. Pt also c/o headache. Please advise.

## 2013-05-03 NOTE — Telephone Encounter (Signed)
Spoke to patient concerning lab/test results/instructions from provider. Patient understood. Will set her up an appointment to see Dr Ladona Ridgel. Will call the pt when the appointment is made.

## 2013-05-03 NOTE — Telephone Encounter (Signed)
PT states that she has been having a rash break out about 45 min after taking Cardizem and she has been getting headaches also. She wants to know if she should continue taking

## 2013-05-04 ENCOUNTER — Ambulatory Visit: Payer: Medicaid Other | Admitting: Obstetrics & Gynecology

## 2013-05-04 NOTE — Telephone Encounter (Signed)
Will not refill diltiazem, she reports headache and rash when she tried it earlier this week

## 2013-05-04 NOTE — Telephone Encounter (Signed)
Appointment made for 05-09-13 with Dr Ladona Ridgel. Pt called and wanted to know if she could take her regular metoprolol before the changes because her heart is beating fast. Please advise.

## 2013-05-04 NOTE — Telephone Encounter (Signed)
Yes, she can try her regular metoprolol if she can tolerate the side effects

## 2013-05-04 NOTE — Telephone Encounter (Signed)
Pt says she will take it until she see's Dr Ladona Ridgel on the 22nd of October.

## 2013-05-09 ENCOUNTER — Ambulatory Visit (INDEPENDENT_AMBULATORY_CARE_PROVIDER_SITE_OTHER): Payer: Medicaid Other | Admitting: Internal Medicine

## 2013-05-09 ENCOUNTER — Encounter: Payer: Self-pay | Admitting: Internal Medicine

## 2013-05-09 VITALS — BP 128/83 | HR 104 | Ht 65.0 in | Wt 120.8 lb

## 2013-05-09 DIAGNOSIS — I491 Atrial premature depolarization: Secondary | ICD-10-CM

## 2013-05-09 DIAGNOSIS — I4729 Other ventricular tachycardia: Secondary | ICD-10-CM | POA: Insufficient documentation

## 2013-05-09 DIAGNOSIS — I472 Ventricular tachycardia: Secondary | ICD-10-CM

## 2013-05-09 DIAGNOSIS — I4949 Other premature depolarization: Secondary | ICD-10-CM

## 2013-05-09 DIAGNOSIS — I493 Ventricular premature depolarization: Secondary | ICD-10-CM

## 2013-05-09 DIAGNOSIS — R002 Palpitations: Secondary | ICD-10-CM

## 2013-05-09 MED ORDER — VERAPAMIL HCL ER 180 MG PO TBCR: 180.0000 mg | EXTENDED_RELEASE_TABLET | Freq: Every day | ORAL | Status: DC

## 2013-05-09 NOTE — Assessment & Plan Note (Signed)
The patient has palpitations which I suspect are multifactorial. She has clear-cut PVCs, PACs, and nonsustained ventricular tachycardia. That her symptoms were initially much improved with low-dose beta blocker, and that her symptoms have worsened with time, suggest that we should be well-controlled. I've asked the patient to maintain a low caffeine diet. To limit any alcohol consumption. We will try her on verapamil, but if it is ineffective, or result in side effects as Cardizem did, we will switch her to flecainide 50 mg twice daily. She may require up titration. I will see the patient back in approximately 12 weeks. She is instructed to call us and let us know how she is doing. I've asked the patient to walk for exercise.

## 2013-05-09 NOTE — Progress Notes (Signed)
HPI Tara Mahoney is referred today by Dr. Wyline Mood for palpitations and nonsustained ventricular tachycardia. The patient is a very pleasant 27 year old woman whose health is been good until approximately 2 years ago. At that time she began to experience the sensation of an irregular heartbeat and underwent echocardiography which demonstrated mitral valve prolapse. She was placed on metoprolol 25 mg twice daily and did better. Her symptoms recurred several months ago, and she was switched from metoprolol to Toprol. She experienced a severe fatigue and sleepiness. She was switched to Cardizem, which resulted in a headache and rash. Both resolved with discontinuation of the medication. She states that she feels her heart racing and beating irregularly at least once an hour, typically lasting up to 5 minutes. She will get dizzy and had chest tightness, but she has not had syncope. As she has a one-year old child, she is appropriately anxious about these episodes. She denies alcohol or caffeine excess. Allergies  Allergen Reactions  . Hydrocodone-Acetaminophen Itching and Nausea And Vomiting     Current Outpatient Prescriptions  Medication Sig Dispense Refill  . ibuprofen (ADVIL,MOTRIN) 600 MG tablet Take 600 mg by mouth as needed for pain.       No current facility-administered medications for this visit.     Past Medical History  Diagnosis Date  . MVP (mitral valve prolapse)   . Palpitations   . Hyperlipidemia   . Headache(784.0)   . Heart murmur   . Complication of anesthesia   . PONV (postoperative nausea and vomiting)   . Dysrhythmia     sinus arrythmia  . Family history of anesthesia complication     PONV    ROS:   All systems reviewed and negative except as noted in the HPI.   Past Surgical History  Procedure Laterality Date  . Cervical cerclage  2008  . Myringotomy    . Cervical cerclage  09/08/2011    Procedure: CERCLAGE CERVICAL;  Surgeon: Lazaro Arms, MD;   Location: [facility];  Service: Gynecology;  Laterality: N/A;  [name],CST-scrubbed; doctor scrubbed in @ [phone]  . Supracervical abdominal hysterectomy  01/26/2012    Procedure: HYSTERECTOMY SUPRACERVICAL ABDOMINAL;  Surgeon: Lazaro Arms, MD;  Location: [facility];  Service: Gynecology;;  converted [phone]  . Laparoscopy  08/09/2012    Procedure: LAPAROSCOPY DIAGNOSTIC;  Surgeon: Lazaro Arms, MD;  Location: [facility];  Service: Gynecology;  Laterality: N/A;  started at  1208-Vaginal trachelectomy (vaginal removal of cervical stump)   . Laparoscopic lysis of adhesions  08/09/2012    Procedure: LAPAROSCOPIC LYSIS OF ADHESIONS;  Surgeon: Lazaro Arms, MD;  Location: [facility];  Service: Gynecology;  Laterality: N/A;     Family History  Problem Relation Age of Onset  . Hypertension Brother   . Heart disease Brother     hole in heart  . Anesthesia problems Neg Hx   . Hypotension Neg Hx   . Pseudochol deficiency Neg Hx   . Malignant hyperthermia Neg Hx   . Other Neg Hx   . Ovarian cancer Mother      History   Social History  . Marital Status: Married    Spouse Name: N/A    Number of Children: 1  . Years of Education: N/A   Occupational History  . unemployed    Social History Main Topics  . Smoking status: Former Smoker -- 1 years    Types: Cigarettes    Quit date: 09/01/2009  .  Smokeless tobacco: Never Used     Comment: smokes 1 pack a week  . Alcohol Use: No  . Drug Use: No  . Sexual Activity: Yes    Birth Control/ Protection: Surgical   Other Topics Concern  . Not on file   Social History Narrative  . No narrative on file     BP 128/83  Pulse 104  Ht 5\' 5"  (1.651 m)  Wt 120 lb 12 oz (54.772 kg)  BMI 20.09 kg/m2  LMP 05/04/2011  Physical Exam:  Well appearing 27 year old woman, NAD HEENT: Unremarkable Neck:  6 cm JVD, no thyromegally Back:  No CVA tenderness Lungs:  Clear with no wheezes, rales, or rhonchi. HEART:  Regular rate rhythm, no murmurs, no rubs, no  clicks Abd:  soft, positive bowel sounds, no organomegally, no rebound, no guarding Ext:  2 plus pulses, no edema, no cyanosis, no clubbing Skin:  No rashes no nodules Neuro:  CN II through XII intact, motor grossly intact  EKG - normal sinus rhythm  Assess/Plan:

## 2013-05-09 NOTE — Assessment & Plan Note (Signed)
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 Hopefully her symptoms will improve with verapamil. If not, we will switch her to flecainide.

## 2013-05-09 NOTE — Patient Instructions (Addendum)
Your physician recommends that you schedule a follow-up appointment in: 10 -12 weeks with Dr Ladona Ridgel  Your physician has recommended you make the following change in your medication:   1. STOP Metoprolol 2. Start Verapamil 180 mg daily. If this medication does not work on controlling symptoms then please call office so we can switch you to Flecainide 50 mg twice a day.  Your physician recommends you avoid alcohol and caffiene

## 2013-05-11 ENCOUNTER — Telehealth: Payer: Self-pay | Admitting: *Deleted

## 2013-05-11 NOTE — Telephone Encounter (Signed)
Dr Ladona Ridgel is not in the office today, call him at the hospital for recommendations

## 2013-05-11 NOTE — Telephone Encounter (Signed)
Called pt to confirm current sxs, pt dad noted she is currently sleeping, pt awoken, pt notes her entire body is shaking like tremors/light headness/heart racing all started yesterday all day and noted again this am, first started taking the medication Verapamil on Wednesday night at 7pm, pt notes she has avoided alcohol and caffiene/no chocalate, pt has not taken her BP, pt notes she is having sharp quick chest pains when only when her heart is racing fast, pt also noted stumbling this am, pt denies sxs prior, pt also notes SOB with the racing heartbeat that is noted as shorter shallow breaths when she is ambulating only also noted on and off today and this am, pt noted she did take the medication again last night, noted DR GT started pt on this medication 05-09-13, If your signs and symptoms worsen please seek medical attention in your local Emergency Room, do not attempt to drive yourself per further endangerment, call 911 for transport or have someone drive you to the ER as soon as possible, sending to Dr Leonia Reeves and Dr Wyline Mood, please advise

## 2013-05-11 NOTE — Telephone Encounter (Signed)
Spoke to pt to advise results/instructions. Pt understood.  

## 2013-05-11 NOTE — Telephone Encounter (Signed)
PT  Started taking new medication on 05/08/13 and has been shaking since she started taking it. Today it was so bad that she could not even walk and she looked like she was drunk. She also had a racing heart but today it was so bad that she could not catch her breath.  Pt is laying down at the moment.

## 2013-05-11 NOTE — Telephone Encounter (Signed)
Would try to take verapamil daily for another 3-4 days. If no improvement, then would switch to flecainide.

## 2013-05-11 NOTE — Telephone Encounter (Signed)
Routed to Dr Wyline Mood, Dr Rosette Reveal and nurse Darl Pikes to advise

## 2013-05-14 MED ORDER — FLECAINIDE ACETATE 50 MG PO TABS: 50.0000 mg | ORAL_TABLET | Freq: Two times a day (BID) | ORAL | Status: DC

## 2013-05-14 NOTE — Telephone Encounter (Signed)
rx sent to pharmacy by e-script  

## 2013-05-14 NOTE — Telephone Encounter (Addendum)
Per several side effects from Verapamil/metoprolol/cardizem Dr Leonia Reeves gave verbal instructions for the pt to stop taking all three of these medications and start low does Flecanide 50mg  twice daily, call our office in a week to advise how this is working for her, f/u apt will then be advised once pt updates with toleration on flecanide, this nurse placed all three medications on the allergy list and gave instructions to pt husband in absence of pt, new rx sent in via escribe, pt will call back one week from starting the medication flecanide, pt husband understood all instructions and will inform pt to contact our office in one week

## 2013-05-14 NOTE — Telephone Encounter (Signed)
Per several side effects from Verapamil/metoprolol/cardizem Dr GT gave verbal instructions for the pt to stop taking all three of these medications and start low does Flecanide 50mg twice daily, call our office in a week to advise how this is working for her, f/u apt will then be advised once pt updates with toleration on flecanide, this nurse placed all three medications on the allergy list and gave instructions to pt husband in absence of pt, new rx sent in via escribe, pt will call back one week from starting the medication flecanide, pt husband understood all instructions and will inform pt to contact our office in one week  

## 2013-05-14 NOTE — Telephone Encounter (Signed)
Pt advised her sxs are still the same however additional symptom of muscle aches all over plus more in the hip area, pt reports no fever, pt notes she is having sweating mostly in her hands, pt did take medication last night at 7pm and still noting the rapid heart rate, pt notes her BP as 129/84 HR 128 with headache/dizzyness noted last night, has noted some functionating between 139/84 HR 97 then back up this am 131/88 HR 111, advised pt I will inform Dr Leonia Reeves however this may take until this afternoon to get back to her, pt understood, please advise

## 2013-05-17 ENCOUNTER — Telehealth: Payer: Self-pay | Admitting: Cardiology

## 2013-05-17 DIAGNOSIS — I471 Supraventricular tachycardia: Secondary | ICD-10-CM

## 2013-05-17 NOTE — Telephone Encounter (Signed)
Wants to know if meds should be making BP go up . / tgs

## 2013-05-17 NOTE — Telephone Encounter (Signed)
Left message to call back  

## 2013-05-18 NOTE — Telephone Encounter (Signed)
Patient returning call, please call

## 2013-05-21 NOTE — Telephone Encounter (Signed)
Pt states that she doesn't believe that the Flecainide is working.  Her heart rate was in the 100's yesterday. BP has been going up and down. Pt states one day it was 79/57 and the next day 133/103.  Please advise.

## 2013-05-22 MED ORDER — FLECAINIDE ACETATE 100 MG PO TABS: 100.0000 mg | ORAL_TABLET | Freq: Two times a day (BID) | ORAL | Status: DC

## 2013-05-22 NOTE — Telephone Encounter (Signed)
Spoke to Dr Ladona Ridgel. He advised for the pt to increase Flecainide to 100 mg BID, GXT, and a flecainide level. Spoke to patient concerning lab/test results/instructions from provider. Patient understood.   Pt is scheduled for GXT on Wed 05/30/13 @8 :45. She is also getting Flecainide level drawn same day.

## 2013-05-22 NOTE — Telephone Encounter (Signed)
Please advise 

## 2013-05-24 ENCOUNTER — Other Ambulatory Visit: Payer: Self-pay

## 2013-05-27 ENCOUNTER — Emergency Department (HOSPITAL_COMMUNITY)
Admission: EM | Admit: 2013-05-27 | Discharge: 2013-05-28 | Disposition: A | Discharge status: Home / Self Care | Payer: Medicaid Other | Attending: Emergency Medicine | Admitting: Emergency Medicine

## 2013-05-27 ENCOUNTER — Emergency Department (HOSPITAL_COMMUNITY): Payer: Medicaid Other

## 2013-05-27 ENCOUNTER — Encounter (HOSPITAL_COMMUNITY): Payer: Self-pay | Admitting: Emergency Medicine

## 2013-05-27 DIAGNOSIS — R000 Tachycardia, unspecified: Secondary | ICD-10-CM | POA: Insufficient documentation

## 2013-05-27 DIAGNOSIS — R079 Chest pain, unspecified: Secondary | ICD-10-CM | POA: Insufficient documentation

## 2013-05-27 DIAGNOSIS — Z79899 Other long term (current) drug therapy: Secondary | ICD-10-CM | POA: Insufficient documentation

## 2013-05-27 DIAGNOSIS — Z87891 Personal history of nicotine dependence: Secondary | ICD-10-CM | POA: Insufficient documentation

## 2013-05-27 DIAGNOSIS — I499 Cardiac arrhythmia, unspecified: Secondary | ICD-10-CM | POA: Insufficient documentation

## 2013-05-27 DIAGNOSIS — E785 Hyperlipidemia, unspecified: Secondary | ICD-10-CM | POA: Insufficient documentation

## 2013-05-27 DIAGNOSIS — R011 Cardiac murmur, unspecified: Secondary | ICD-10-CM | POA: Insufficient documentation

## 2013-05-27 LAB — CBC WITH DIFFERENTIAL/PLATELET
Eosinophils Absolute: 0.2 10*3/uL (ref 0.0–0.7)
Eosinophils Relative: 3 % (ref 0–5)
HCT: 41.4 % (ref 36.0–46.0)
Hemoglobin: 14.6 g/dL (ref 12.0–15.0)
Lymphocytes Relative: 27 % (ref 12–46)
Lymphs Abs: 1.9 10*3/uL (ref 0.7–4.0)
MCH: 31.2 pg (ref 26.0–34.0)
MCV: 88.5 fL (ref 78.0–100.0)
Monocytes Absolute: 0.4 10*3/uL (ref 0.1–1.0)
Monocytes Relative: 6 % (ref 3–12)
RBC: 4.68 MIL/uL (ref 3.87–5.11)
WBC: 7 10*3/uL (ref 4.0–10.5)

## 2013-05-27 LAB — BASIC METABOLIC PANEL
BUN: 14 mg/dL (ref 6–23)
CO2: 27 mEq/L (ref 19–32)
Calcium: 10.1 mg/dL (ref 8.4–10.5)
GFR calc non Af Amer: 79 mL/min — ABNORMAL LOW (ref >90)
Glucose, Bld: 97 mg/dL (ref 70–99)

## 2013-05-27 NOTE — ED Notes (Addendum)
When standing, patient became tachycardic rate 117 at highest, swayed a little and states she felt light-headed.    02 sats remained 99-100%

## 2013-05-27 NOTE — ED Provider Notes (Signed)
CSN: [id number]     Arrival date & time 05/27/13  1921 History   First MD Initiated Contact with Patient 05/27/13 2101    Scribed for Benny Lennert, MD, the patient was seen in room [facility]. This chart was scribed by Lewanda Rife, ED scribe. Patient's care was started at 9:05 PM  Chief Complaint  Patient presents with  . Tachycardia   (Consider location/radiation/quality/duration/timing/severity/associated sxs/prior Treatment) Patient is a 27 y.o. female presenting with chest pain. The history is provided by the patient. No language interpreter was used.  Chest Pain Pain radiates to:  Does not radiate Pain radiates to the back: no   Pain severity:  Moderate Onset quality:  Sudden Timing:  Constant Progression:  Improving Chronicity:  Chronic Context comment:  Feeding child  Relieved by:  Nothing Worsened by:  Nothing tried Ineffective treatments:  None tried Associated symptoms: cough and palpitations   Associated symptoms: no abdominal pain, no back pain, no fatigue and no headache   Risk factors: high cholesterol   Risk factors: no diabetes mellitus    HPI Comments: Tara Mahoney is a 27 y.o. female who presents to the Emergency Department with PMHx of mitral valve prolapse, sustained ventricular tachycardia, and dysrhythmia complaining of constant moderate chest pain onset PTA. Reports she was feeding toddler when chest pain started. Reports associated palpations, and non-productive cough. Denies any aggravating or alleviating factors. Denies associated shortness of breath, and fever. Reports she has been taking flecainide 100 mg BID and last dose was 7:45 pm.   Past Medical History  Diagnosis Date  . MVP (mitral valve prolapse)   . Palpitations   . Hyperlipidemia   . Headache(784.0)   . Heart murmur   . Complication of anesthesia   . PONV (postoperative nausea and vomiting)   . Dysrhythmia     sinus arrythmia  . Family history of anesthesia complication      PONV   Past Surgical History  Procedure Laterality Date  . Cervical cerclage  2008  . Myringotomy    . Cervical cerclage  09/08/2011    Procedure: CERCLAGE CERVICAL;  Surgeon: Lazaro Arms, MD;  Location: [facility];  Service: Gynecology;  Laterality: N/A;  [name],CST-scrubbed; doctor scrubbed in @ [phone]  . Supracervical abdominal hysterectomy  01/26/2012    Procedure: HYSTERECTOMY SUPRACERVICAL ABDOMINAL;  Surgeon: Lazaro Arms, MD;  Location: [facility];  Service: Gynecology;;  converted [phone]  . Laparoscopy  08/09/2012    Procedure: LAPAROSCOPY DIAGNOSTIC;  Surgeon: Lazaro Arms, MD;  Location: [facility];  Service: Gynecology;  Laterality: N/A;  started at  1208-Vaginal trachelectomy (vaginal removal of cervical stump)   . Laparoscopic lysis of adhesions  08/09/2012    Procedure: LAPAROSCOPIC LYSIS OF ADHESIONS;  Surgeon: Lazaro Arms, MD;  Location: [facility];  Service: Gynecology;  Laterality: N/A;   Family History  Problem Relation Age of Onset  . Hypertension Brother   . Heart disease Brother     hole in heart  . Anesthesia problems Neg Hx   . Hypotension Neg Hx   . Pseudochol deficiency Neg Hx   . Malignant hyperthermia Neg Hx   . Other Neg Hx   . Ovarian cancer Mother    History  Substance Use Topics  . Smoking status: Former Smoker -- 1 years    Types: Cigarettes    Quit date: 09/01/2009  . Smokeless tobacco: Never Used     Comment: smokes 1 pack a week  . Alcohol Use:  No   OB History   Grav Para Term Preterm Abortions TAB SAB Ect Mult Living   2 2 1 1      2      Review of Systems  Constitutional: Negative for appetite change and fatigue.  HENT: Negative for congestion, ear discharge and sinus pressure.   Eyes: Negative for discharge.  Respiratory: Positive for cough.   Cardiovascular: Positive for chest pain and palpitations.  Gastrointestinal: Negative for abdominal pain and diarrhea.  Genitourinary: Negative for frequency and hematuria.  Musculoskeletal:  Negative for back pain.  Skin: Negative for rash.  Neurological: Negative for seizures and headaches.  Psychiatric/Behavioral: Negative for hallucinations.    Allergies  Cardizem cd; Hydrocodone-acetaminophen; Metoprolol; and Verapamil  Home Medications   Current Outpatient Rx  Name  Route  Sig  Dispense  Refill  . flecainide (TAMBOCOR) 100 MG tablet   Oral   Take 1 tablet (100 mg total) by mouth 2 (two) times daily.   60 tablet   6    BP 110/67  Pulse 102  Temp(Src) 98.3 F (36.8 C) (Oral)  Resp 24  Ht 5\' 3"  (1.6 m)  Wt 117 lb (53.071 kg)  BMI 20.73 kg/m2  SpO2 100%  LMP 05/04/2011 Physical Exam  Nursing note and vitals reviewed. Constitutional: She is oriented to person, place, and time. She appears well-developed and well-nourished. No distress.  HENT:  Head: Normocephalic and atraumatic.  Eyes: Conjunctivae and EOM are normal. No scleral icterus.  Neck: Neck supple. No tracheal deviation present. No thyromegaly present.  Cardiovascular: Normal rate and regular rhythm.  Exam reveals no gallop and no friction rub.   No murmur heard. Pulmonary/Chest: Effort normal. No stridor. No respiratory distress. She has no wheezes. She has no rales. She exhibits no tenderness.  Abdominal: She exhibits no distension. There is no tenderness. There is no rebound.  Musculoskeletal: Normal range of motion. She exhibits no edema.  Lymphadenopathy:    She has no cervical adenopathy.  Neurological: She is alert and oriented to person, place, and time. She exhibits normal muscle tone. Coordination normal.  Skin: Skin is warm and dry. No rash noted. No erythema.  Psychiatric: She has a normal mood and affect. Her behavior is normal.    ED Course  Procedures (including critical care time)  COORDINATION OF CARE:  Nursing notes reviewed. Vital signs reviewed. Initial pt interview and examination performed.   9:10 PM-Discussed work up plan with pt at bedside, which includes CXR, CBC  with diff, BMP, EKG, and Troponin. Pt agrees with plan.   Treatment plan initiated:Medications - No data to display   Initial diagnostic testing ordered.    Labs Review Labs Reviewed  CBC WITH DIFFERENTIAL - Abnormal; Notable for the following:    Platelets 125 (*)    All other components within normal limits  BASIC METABOLIC PANEL - Abnormal; Notable for the following:    GFR calc non Af Amer 79 (*)    All other components within normal limits  TROPONIN I   Imaging Review No results found.  EKG Interpretation   None       MDM  No diagnosis found.  The chart was scribed for me under my direct supervision.  I personally performed the history, physical, and medical decision making and all procedures in the evaluation of this patient.Benny Lennert, MD 05/27/13 2352

## 2013-05-27 NOTE — ED Notes (Addendum)
Pt had 3 sharp pains in chest about dinner time.  Pt has chronic tachycardia.  History of mitral valve prolapse and conduction problems.  Increasing SOB with activity.  Pt reporting change in cardiac medications about 2 weeks ago.  Dose increased about 1 week ago.

## 2013-05-28 ENCOUNTER — Telehealth: Payer: Self-pay | Admitting: Cardiology

## 2013-05-28 NOTE — Telephone Encounter (Signed)
FYI: THE NOTE BELOW

## 2013-05-28 NOTE — Telephone Encounter (Signed)
Patient went to ER last night and states that ED physician said for her to let us know that she was there. / tgs

## 2013-05-28 NOTE — Telephone Encounter (Signed)
Can you please forward this information to Dr Ladona Ridgel, he has been following her and initiated her on flecanide.

## 2013-05-30 ENCOUNTER — Encounter (HOSPITAL_COMMUNITY): Payer: Self-pay

## 2013-05-30 ENCOUNTER — Ambulatory Visit (HOSPITAL_COMMUNITY)
Admission: RE | Admit: 2013-05-30 | Discharge: 2013-05-30 | Disposition: A | Discharge status: Home / Self Care | Payer: Medicaid Other | Source: Ambulatory Visit | Attending: Internal Medicine | Admitting: Internal Medicine

## 2013-05-30 DIAGNOSIS — I059 Rheumatic mitral valve disease, unspecified: Secondary | ICD-10-CM

## 2013-05-30 DIAGNOSIS — I471 Supraventricular tachycardia, unspecified: Secondary | ICD-10-CM

## 2013-05-30 DIAGNOSIS — R002 Palpitations: Secondary | ICD-10-CM | POA: Insufficient documentation

## 2013-05-30 DIAGNOSIS — I493 Ventricular premature depolarization: Secondary | ICD-10-CM

## 2013-05-30 DIAGNOSIS — I4949 Other premature depolarization: Secondary | ICD-10-CM | POA: Insufficient documentation

## 2013-05-30 DIAGNOSIS — I491 Atrial premature depolarization: Secondary | ICD-10-CM | POA: Insufficient documentation

## 2013-05-30 DIAGNOSIS — I498 Other specified cardiac arrhythmias: Secondary | ICD-10-CM | POA: Insufficient documentation

## 2013-05-30 NOTE — Progress Notes (Addendum)
Stress Lab Nurses Notes - Tara Mahoney  Moe Brier 05/30/2013 Reason for doing test: PVC, PAC, SVT,  Palpitations & Mitral Valve Prolap Type of test: Regular GTX Nurse performing test: Parke Poisson, RN Nuclear Medicine Tech: Not Applicable Echo Tech: Not Applicable MD performing test: Amaryllis Malmquist / K.Lyman Bishop NP Family MD: DonDiego Test explained and consent signed: yes IV started: No IV started Symptoms: Fatigue Treatment/Intervention: None Reason test stopped: reached target HR After recovery IV was: NA Patient to return to Nuc. Med at :NA Patient discharged: Home Patient's Condition upon discharge was: stable Comments: During test peak BP 131/80 & HR 187.  Recovery BP 119/83 & HR 106. Symptoms resolved in recovery.  Erskine Speed T   Attending Note Exercise stress test on flecanide  Patient exercised for 9 minutes 20 seconds, achieving 11.20 METs. With exercise her heart rate increased from 103 bpm to 187 bpm (97% of target heart rate) and blood pressure increased from 117/89 to 131/80. She did not experience any chest pain, the test was stopped due to fatigue. Baseline EKG showed sinus tachycardia with baseline QRS of 80 ms in lead II. With stress there were no ischemic ST/T changes, no arrhythmias, and no QRS prolongation (QRS during stress was 80 ms). Per notes patient was on flecanide 100mg  bid at the time of the test.   Conclusions 1. Negative exercise stress test for ischemia 2. Normal functional capacity for age and gender 40. Duke treadmill score of 11 consistent with low risk for major cardiac events 4. No significant QRS prolongation on flecanide

## 2013-06-04 ENCOUNTER — Telehealth: Payer: Self-pay | Admitting: *Deleted

## 2013-06-04 DIAGNOSIS — Z5181 Encounter for therapeutic drug level monitoring: Secondary | ICD-10-CM

## 2013-06-04 DIAGNOSIS — R002 Palpitations: Secondary | ICD-10-CM

## 2013-06-04 NOTE — Telephone Encounter (Signed)
Please advise 

## 2013-06-04 NOTE — Telephone Encounter (Signed)
Pt is calling for stress test results and she wants [place] to know that she feels that her medication she take for irregular heart beat is not working at all.

## 2013-06-04 NOTE — Telephone Encounter (Signed)
This nurse left a vm for Dr. Lewayne Bunting to advise on pt, will call pt prior to end of shift to advise

## 2013-06-04 NOTE — Telephone Encounter (Signed)
The stress test I read was normal. There were no significant arrhythmias during the test. The medicine she is on can sometimes cause significant EKG changes during exercise, these were not present either. Regarding the effectiveness, please make Dr Ladona Ridgel aware regarding the flecanide and if there is any changes he would like.

## 2013-06-05 NOTE — Telephone Encounter (Signed)
Dr Ladona Ridgel gave verbal order for the pt to have a 24 hour holter monitor placed on her and to have her follow up with him in office after this is completed, apt made for the pt to come into have the monitor placed on 06-07-13 1:30pm at [facility] main entrance, pt apt 06-25-13 at 9am in the [place] office, this nurse called the pt to inform her, pt accepted apt and understood

## 2013-06-05 NOTE — Telephone Encounter (Signed)
Order placed to have pt obtain a lab draw for flecainide level, called pt and gave instructions for lab draw verba tum, pt understood and will have the lab drawn tomorrow after holding the medication and drawn between 8-9, pt aware this level can take 3-4 days to be resulted thus once completed we will call her with the instructions/next steps, pt understood

## 2013-06-05 NOTE — Addendum Note (Signed)
Addended by: Derry Lory A on: 06/05/2013 02:27 PM   Modules accepted: Orders

## 2013-06-05 NOTE — Telephone Encounter (Signed)
Tara Mahoney, I would like a trough flecainide level obtained as well. This needs to be drawn in the morning (around 8-9). Instruct the patient not to take her morning dose of the flecainide. We need to be sure she is taking her medication. The day that this is drawn does not matter as long as we can get the results back before she is seen in the office. GT

## 2013-06-07 ENCOUNTER — Other Ambulatory Visit: Payer: Self-pay | Admitting: *Deleted

## 2013-06-07 ENCOUNTER — Ambulatory Visit (HOSPITAL_COMMUNITY)
Admission: RE | Admit: 2013-06-07 | Discharge: 2013-06-07 | Disposition: A | Discharge status: Home / Self Care | Payer: Medicaid Other | Source: Ambulatory Visit | Attending: Internal Medicine | Admitting: Internal Medicine

## 2013-06-07 DIAGNOSIS — R002 Palpitations: Secondary | ICD-10-CM

## 2013-06-07 NOTE — Progress Notes (Signed)
24 hour Holter Monitor in progress. Ends @ 2pm on 06-08-13

## 2013-06-25 ENCOUNTER — Encounter: Payer: Self-pay | Admitting: Internal Medicine

## 2013-06-25 ENCOUNTER — Ambulatory Visit (INDEPENDENT_AMBULATORY_CARE_PROVIDER_SITE_OTHER): Payer: Medicaid Other | Admitting: Internal Medicine

## 2013-06-25 VITALS — BP 111/66 | HR 102 | Ht 63.0 in | Wt 122.0 lb

## 2013-06-25 DIAGNOSIS — I4949 Other premature depolarization: Secondary | ICD-10-CM

## 2013-06-25 DIAGNOSIS — I493 Ventricular premature depolarization: Secondary | ICD-10-CM

## 2013-06-25 NOTE — Progress Notes (Signed)
HPI [name] returns today for followup. She is a very pleasant 27 yo woman with a h/o palpitations and non-sustained supraventricular and ventricular arrhythmias. She was very symptomatic and was placed initially on verapamil which was ineffective and then on flecainide. She has improved on 100 mg twice daily of flecainide. She has undergone repeat 24 hour holter demonstrating an improvement in her holter. Her symptoms are better. She has had a headache but notes chronic headaches. Allergies  Allergen Reactions  . Cardizem Cd [Diltiazem Hcl Er]     rash  . Hydrocodone-Acetaminophen Itching and Nausea And Vomiting  . Metoprolol     Headaches, dizzyness  . Verapamil     Racing heart, dizzyness, headache,shaking all over her body      Current Outpatient Prescriptions  Medication Sig Dispense Refill  . flecainide (TAMBOCOR) 100 MG tablet Take 1 tablet (100 mg total) by mouth 2 (two) times daily.  60 tablet  6   No current facility-administered medications for this visit.     Past Medical History  Diagnosis Date  . MVP (mitral valve prolapse)   . Palpitations   . Hyperlipidemia   . Headache(784.0)   . Heart murmur   . Complication of anesthesia   . PONV (postoperative nausea and vomiting)   . Dysrhythmia     sinus arrythmia  . Family history of anesthesia complication     PONV    ROS:   All systems reviewed and negative except as noted in the HPI.   Past Surgical History  Procedure Laterality Date  . Cervical cerclage  2008  . Myringotomy    . Cervical cerclage  09/08/2011    Procedure: CERCLAGE CERVICAL;  Surgeon: Lazaro Arms, MD;  Location: [facility];  Service: Gynecology;  Laterality: N/A;  [name],CST-scrubbed; doctor scrubbed in @ [phone]  . Supracervical abdominal hysterectomy  01/26/2012    Procedure: HYSTERECTOMY SUPRACERVICAL ABDOMINAL;  Surgeon: Lazaro Arms, MD;  Location: [facility];  Service: Gynecology;;  converted [phone]  . Laparoscopy  08/09/2012     Procedure: LAPAROSCOPY DIAGNOSTIC;  Surgeon: Lazaro Arms, MD;  Location: [facility];  Service: Gynecology;  Laterality: N/A;  started at  1208-Vaginal trachelectomy (vaginal removal of cervical stump)   . Laparoscopic lysis of adhesions  08/09/2012    Procedure: LAPAROSCOPIC LYSIS OF ADHESIONS;  Surgeon: Lazaro Arms, MD;  Location: [facility];  Service: Gynecology;  Laterality: N/A;     Family History  Problem Relation Age of Onset  . Hypertension Brother   . Heart disease Brother     hole in heart  . Anesthesia problems Neg Hx   . Hypotension Neg Hx   . Pseudochol deficiency Neg Hx   . Malignant hyperthermia Neg Hx   . Other Neg Hx   . Ovarian cancer Mother      History   Social History  . Marital Status: Married    Spouse Name: N/A    Number of Children: 1  . Years of Education: N/A   Occupational History  . unemployed    Social History Main Topics  . Smoking status: Former Smoker -- 1 years    Types: Cigarettes    Quit date: 09/01/2009  . Smokeless tobacco: Never Used     Comment: smokes 1 pack a week  . Alcohol Use: No  . Drug Use: No  . Sexual Activity: Yes    Birth Control/ Protection: Surgical   Other Topics Concern  .  Not on file   Social History Narrative  . No narrative on file     BP 111/66  Pulse 102  Ht 5\' 3"  (1.6 m)  Wt 122 lb (55.339 kg)  BMI 21.62 kg/m2  LMP 05/04/2011  Physical Exam:  Well appearing NAD HEENT: Unremarkable Neck:  No JVD, no thyromegally Lymphatics:  No adenopathy Back:  No CVA tenderness Lungs:  Clear HEART:  Regular rate rhythm, no murmurs, no rubs, no clicks Abd:  soft, positive bowel sounds, no organomegally, no rebound, no guarding Ext:  2 plus pulses, no edema, no cyanosis, no clubbing Skin:  No rashes no nodules Neuro:  CN II through XII intact, motor grossly intact  EKG  DEVICE  Normal device function.  See PaceArt for details.   Assess/Plan:

## 2013-06-25 NOTE — Assessment & Plan Note (Signed)
Her symptoms are much improved and I have recommended she continue her flecainide. I would expect to have her reduce her flecainide over the next 12-18 months. If her headaches cannot be controlled, would switch to rhythmol, though I doubt the headaches are caused by flecainide.

## 2013-06-25 NOTE — Patient Instructions (Signed)
Your physician recommends that you schedule a follow-up appointment in: 3-4 months with Dr Court Joy will receive a reminder letter two months in advance reminding you to call and schedule your appointment. If you don't receive this letter, please contact our office.  Your physician recommends that you continue on your current medications as directed. Please refer to the Current Medication list given to you today.

## 2013-07-02 ENCOUNTER — Encounter: Payer: Medicaid Other | Admitting: Cardiology

## 2013-07-02 NOTE — Progress Notes (Signed)
Clinical Summary Tara Mahoney is a 26 y.o.female   Past Medical History  Diagnosis Date  . MVP (mitral valve prolapse)   . Palpitations   . Hyperlipidemia   . Headache(784.0)   . Heart murmur   . Complication of anesthesia   . PONV (postoperative nausea and vomiting)   . Dysrhythmia     sinus arrythmia  . Family history of anesthesia complication     PONV     Allergies  Allergen Reactions  . Cardizem Cd [Diltiazem Hcl Er]     rash  . Hydrocodone-Acetaminophen Itching and Nausea And Vomiting  . Metoprolol     Headaches, dizzyness  . Verapamil     Racing heart, dizzyness, headache,shaking all over her body      Current Outpatient Prescriptions  Medication Sig Dispense Refill  . flecainide (TAMBOCOR) 100 MG tablet Take 1 tablet (100 mg total) by mouth 2 (two) times daily.  60 tablet  6   No current facility-administered medications for this visit.     Past Surgical History  Procedure Laterality Date  . Cervical cerclage  2008  . Myringotomy    . Cervical cerclage  09/08/2011    Procedure: CERCLAGE CERVICAL;  Surgeon: Lazaro Arms, MD;  Location: [facility];  Service: Gynecology;  Laterality: N/A;  [name],CST-scrubbed; doctor scrubbed in @ [phone]  . Supracervical abdominal hysterectomy  01/26/2012    Procedure: HYSTERECTOMY SUPRACERVICAL ABDOMINAL;  Surgeon: Lazaro Arms, MD;  Location: [facility];  Service: Gynecology;;  converted [phone]  . Laparoscopy  08/09/2012    Procedure: LAPAROSCOPY DIAGNOSTIC;  Surgeon: Lazaro Arms, MD;  Location: [facility];  Service: Gynecology;  Laterality: N/A;  started at  1208-Vaginal trachelectomy (vaginal removal of cervical stump)   . Laparoscopic lysis of adhesions  08/09/2012    Procedure: LAPAROSCOPIC LYSIS OF ADHESIONS;  Surgeon: Lazaro Arms, MD;  Location: [facility];  Service: Gynecology;  Laterality: N/A;     Allergies  Allergen Reactions  . Cardizem Cd [Diltiazem Hcl Er]     rash  . Hydrocodone-Acetaminophen Itching and  Nausea And Vomiting  . Metoprolol     Headaches, dizzyness  . Verapamil     Racing heart, dizzyness, headache,shaking all over her body       Family History  Problem Relation Age of Onset  . Hypertension Brother   . Heart disease Brother     hole in heart  . Anesthesia problems Neg Hx   . Hypotension Neg Hx   . Pseudochol deficiency Neg Hx   . Malignant hyperthermia Neg Hx   . Other Neg Hx   . Ovarian cancer Mother      Social History [name] reports that she quit smoking about 3 years ago. Her smoking use included Cigarettes. She smoked 0.00 packs per day for 1 year. She has never used smokeless tobacco. [name] reports that she does not drink alcohol.   Review of Systems CONSTITUTIONAL: No weight loss, fever, chills, weakness or fatigue.  HEENT: Eyes: No visual loss, blurred vision, double vision or yellow sclerae.No hearing loss, sneezing, congestion, runny nose or sore throat.  SKIN: No rash or itching.  CARDIOVASCULAR:  RESPIRATORY: No shortness of breath, cough or sputum.  GASTROINTESTINAL: No anorexia, nausea, vomiting or diarrhea. No abdominal pain or blood.  GENITOURINARY: No burning on urination, no polyuria NEUROLOGICAL: No headache, dizziness, syncope, paralysis, ataxia, numbness or tingling in the extremities. No change in bowel or bladder control.  MUSCULOSKELETAL:  No muscle, back pain, joint pain or stiffness.  LYMPHATICS: No enlarged nodes. No history of splenectomy.  PSYCHIATRIC: No history of depression or anxiety.  ENDOCRINOLOGIC: No reports of sweating, cold or heat intolerance. No polyuria or polydipsia.  Marland Kitchen   Physical Examination There were no vitals filed for this visit. There were no vitals filed for this visit.  Gen: resting comfortably, no acute distress HEENT: no scleral icterus, pupils equal round and reactive, no palptable cervical adenopathy,  CV Resp: Clear to auscultation bilaterally GI: abdomen is soft, non-tender,  non-distended, normal bowel sounds, no hepatosplenomegaly MSK: extremities are warm, no edema.  Skin: warm, no rash Neuro:  no focal deficits Psych: appropriate affect   Diagnostic Studies     Assessment and Plan        Antoine Poche, M.D., F.A.C.C.

## 2013-07-26 ENCOUNTER — Ambulatory Visit: Payer: Medicaid Other | Admitting: Internal Medicine

## 2013-09-24 ENCOUNTER — Ambulatory Visit (INDEPENDENT_AMBULATORY_CARE_PROVIDER_SITE_OTHER): Payer: Medicaid Other | Admitting: Internal Medicine

## 2013-09-24 ENCOUNTER — Encounter: Payer: Self-pay | Admitting: Internal Medicine

## 2013-09-24 VITALS — BP 102/61 | HR 85 | Ht 63.0 in | Wt 121.0 lb

## 2013-09-24 DIAGNOSIS — R002 Palpitations: Secondary | ICD-10-CM

## 2013-09-24 DIAGNOSIS — I493 Ventricular premature depolarization: Secondary | ICD-10-CM

## 2013-09-24 DIAGNOSIS — I4949 Other premature depolarization: Secondary | ICD-10-CM

## 2013-09-24 NOTE — Assessment & Plan Note (Signed)
Her symptoms are controlled. She will continue her flecainide. If she has breakthrough symptoms, she is instructed to take an additional dose of flecainide. I will see her back in 6 months.

## 2013-09-24 NOTE — Progress Notes (Signed)
HPI [name] returns today for followup. She is a very pleasant 28 yo woman with a h/o palpitations and non-sustained supraventricular and ventricular arrhythmias. She was very symptomatic and was placed initially on verapamil which was ineffective and then on flecainide. She has improved on 100 mg twice daily of flecainide. She has undergone repeat 24 hour holter demonstrating an improvement in her holter. Her symptoms are better. She has had a headache but this has also improved. She notes that she will have occaisional palpitations an hour or two before it is time to take her flecainide. No syncope. Allergies  Allergen Reactions  . Cardizem Cd [Diltiazem Hcl Er]     rash  . Hydrocodone-Acetaminophen Itching and Nausea And Vomiting  . Metoprolol     Headaches, dizzyness  . Verapamil     Racing heart, dizzyness, headache,shaking all over her body      Current Outpatient Prescriptions  Medication Sig Dispense Refill  . flecainide (TAMBOCOR) 100 MG tablet Take 1 tablet (100 mg total) by mouth 2 (two) times daily.  60 tablet  6   No current facility-administered medications for this visit.     Past Medical History  Diagnosis Date  . MVP (mitral valve prolapse)   . Palpitations   . Hyperlipidemia   . Headache(784.0)   . Heart murmur   . Complication of anesthesia   . PONV (postoperative nausea and vomiting)   . Dysrhythmia     sinus arrythmia  . Family history of anesthesia complication     PONV    ROS:   All systems reviewed and negative except as noted in the HPI.   Past Surgical History  Procedure Laterality Date  . Cervical cerclage  2008  . Myringotomy    . Cervical cerclage  09/08/2011    Procedure: CERCLAGE CERVICAL;  Surgeon: Lazaro ArmsLuther H Eure, MD;  Location: [facility];  Service: Gynecology;  Laterality: N/A;  [name],CST-scrubbed; doctor scrubbed in @ [phone]  . Supracervical abdominal hysterectomy  01/26/2012    Procedure: HYSTERECTOMY SUPRACERVICAL  ABDOMINAL;  Surgeon: Lazaro ArmsLuther H Eure, MD;  Location: [facility];  Service: Gynecology;;  converted [phone]  . Laparoscopy  08/09/2012    Procedure: LAPAROSCOPY DIAGNOSTIC;  Surgeon: Lazaro ArmsLuther H Eure, MD;  Location: [facility];  Service: Gynecology;  Laterality: N/A;  started at  1208-Vaginal trachelectomy (vaginal removal of cervical stump)   . Laparoscopic lysis of adhesions  08/09/2012    Procedure: LAPAROSCOPIC LYSIS OF ADHESIONS;  Surgeon: Lazaro ArmsLuther H Eure, MD;  Location: [facility];  Service: Gynecology;  Laterality: N/A;     Family History  Problem Relation Age of Onset  . Hypertension Brother   . Heart disease Brother     hole in heart  . Anesthesia problems Neg Hx   . Hypotension Neg Hx   . Pseudochol deficiency Neg Hx   . Malignant hyperthermia Neg Hx   . Other Neg Hx   . Ovarian cancer Mother      History   Social History  . Marital Status: Married    Spouse Name: N/A    Number of Children: 1  . Years of Education: N/A   Occupational History  . unemployed    Social History Main Topics  . Smoking status: Former Smoker -- 1 years    Types: Cigarettes    Quit date: 09/01/2009  . Smokeless tobacco: Never Used     Comment: smokes 1 pack a week  . Alcohol Use: No  .  Drug Use: No  . Sexual Activity: Yes    Birth Control/ Protection: Surgical   Other Topics Concern  . Not on file   Social History Narrative  . No narrative on file     BP 102/61  Pulse 85  Ht 5\' 3"  (1.6 m)  Wt 121 lb (54.885 kg)  BMI 21.44 kg/m2  LMP 05/04/2011  Physical Exam:  Well appearing young woman, NAD HEENT: Unremarkable Neck:  No JVD, no thyromegally Back:  No CVA tenderness Lungs:  Clear with no wheezes, rales, or rhonchi HEART:  Regular rate rhythm, no murmurs, no rubs, no clicks Abd:  soft, positive bowel sounds, no organomegally, no rebound, no guarding Ext:  2 plus pulses, no edema, no cyanosis, no clubbing Skin:  No rashes no nodules Neuro:  CN II through XII intact, motor grossly  intact  EKG - NSR    Assess/Plan:

## 2013-09-24 NOTE — Patient Instructions (Signed)
Your physician recommends that you schedule a follow-up appointment in: 6 months with Dr Taylor You will receive a reminder letter two months in advance reminding you to call and schedule your appointment. If you don't receive this letter, please contact our office.  Your physician recommends that you continue on your current medications as directed. Please refer to the Current Medication list given to you today.   

## 2013-12-14 NOTE — Progress Notes (Signed)
This encounter was created in error - please disregard.

## 2014-02-04 ENCOUNTER — Telehealth: Payer: Self-pay | Admitting: Internal Medicine

## 2014-02-04 NOTE — Telephone Encounter (Signed)
Please call patient to discuss symptoms to see if she needs to be seen . / tgs

## 2014-02-04 NOTE — Telephone Encounter (Signed)
lmtcb

## 2014-02-05 NOTE — Telephone Encounter (Signed)
Patient [place] when her mother became unresponsive and died in her arms.Autopsy is today.Prior to this patient has noted more tachycardia and lightheadedness,did not check pulse rate.The third episode she also had a nose bleed.She understood per Dr.Taylors advise she could take more Flecainide for break through tachycardia which she states made no difference. Apt made for tomorrow with M.Lenze PA-C

## 2014-02-06 ENCOUNTER — Encounter: Payer: Self-pay | Admitting: Physician Assistant

## 2014-02-06 ENCOUNTER — Ambulatory Visit (INDEPENDENT_AMBULATORY_CARE_PROVIDER_SITE_OTHER): Payer: Medicaid Other | Admitting: Physician Assistant

## 2014-02-06 VITALS — BP 120/80 | HR 86 | Ht 63.0 in | Wt 120.0 lb

## 2014-02-06 DIAGNOSIS — R000 Tachycardia, unspecified: Secondary | ICD-10-CM

## 2014-02-06 DIAGNOSIS — I498 Other specified cardiac arrhythmias: Secondary | ICD-10-CM

## 2014-02-06 DIAGNOSIS — I472 Ventricular tachycardia: Secondary | ICD-10-CM

## 2014-02-06 DIAGNOSIS — I4729 Other ventricular tachycardia: Secondary | ICD-10-CM

## 2014-02-06 DIAGNOSIS — I493 Ventricular premature depolarization: Secondary | ICD-10-CM

## 2014-02-06 DIAGNOSIS — I4949 Other premature depolarization: Secondary | ICD-10-CM

## 2014-02-06 MED ORDER — ALPRAZOLAM 0.25 MG PO TABS: 0.2500 mg | ORAL_TABLET | Freq: Three times a day (TID) | ORAL | Status: DC | PRN

## 2014-02-06 NOTE — Assessment & Plan Note (Signed)
Patient is in sinus tachycardia at rest. She did experience her mother dying in her arms 2 days ago and is under an enormous amount of stress. She does not feel like she is dehydrated but with her resting symptoms and after discussing with Dr. Ladona Ridgelaylor we feel like she needs extra fluids. Recommend drinking extra water and Gatorade today. Dr. Ladona Ridgelaylor also recommends low-dose Xanax as needed for the next several days. We will give her prescription for 0.25 mg with 20 tablets no refills. The patient does not like taking pills and says she may not fill it but would like to have it if needed. She has not slept in over 2 days. We will do a stress test within the next month and she will followup with Dr. Ladona Ridgelaylor

## 2014-02-06 NOTE — Assessment & Plan Note (Signed)
Controlled on flecainide

## 2014-02-06 NOTE — Patient Instructions (Addendum)
Your physician recommends that you schedule a follow-up appointment in: 1 month with Dr Ladona Ridgelaylor  Your physician has recommended you make the following change in your medication:   Xanax 0.25 mg up to 3 times a day as needed.  Please drink plenty of fluid.

## 2014-02-06 NOTE — Progress Notes (Signed)
HPI: This is a 28 yr old female patient of Dr. Ladona Ridgelaylor who has a history of palpitations and NSVT controlled with Flecainide 100mg  BID. She was tubing with her mother who became unresponsive and died in her arms. Ever since then the patient has increased palpitations and lightheadedness. She's taken extra Flecainide for break through tachycardia which hasn't helped. She states that she's been having dizziness with change of position even before her mother died. She denies any chest pain or syncope, dyspnea. She states that she usually doesn't go outside and doesn't think she's dehydrated. Beta blockers have caused severe fatigue and sleepiness and Cardizem was ineffective.   Allergies: -- Cardizem Cd [Diltiazem Hcl Er Beads]    --  rash  -- Hydrocodone-Acetaminophen -- Itching and Nausea And Vomiting  -- Metoprolol    --  Headaches, dizzyness  -- Verapamil    --  Racing heart, dizzyness, headache,shaking all over            her body  No current outpatient prescriptions on file prior to visit. No current facility-administered medications on file prior to visit.   Past Medical History:   MVP (mitral valve prolapse)                                  Palpitations                                                 Hyperlipidemia                                               Headache(784.0)                                              Heart murmur                                                 Complication of anesthesia                                   PONV (postoperative nausea and vomiting)                     Dysrhythmia                                                    Comment:sinus arrythmia   Family history of anesthesia complication                      Comment:PONV  Past Surgical History:   CERVICAL CERCLAGE  2008         MYRINGOTOMY                                                   CERVICAL CERCLAGE                                09/08/2011   Comment:Procedure: CERCLAGE CERVICAL;  Surgeon: Lazaro Arms, MD;  Location: [facility];  Service:               Gynecology;  Laterality: N/A;  Maggie               Henderson,CST-scrubbed; doctor scrubbed in @               [phone]   SUPRACERVICAL ABDOMINAL HYSTERECTOMY             01/26/2012      Comment:Procedure: HYSTERECTOMY SUPRACERVICAL               ABDOMINAL;  Surgeon: Lazaro Arms, MD;                Location: [facility];  Service: Gynecology;;                converted [id number]   LAPAROSCOPY                                      08/09/2012      Comment:Procedure: LAPAROSCOPY DIAGNOSTIC;  Surgeon:               Lazaro Arms, MD;  Location: [facility];  Service:              Gynecology;  Laterality: N/A;  started at                1208-Vaginal trachelectomy (vaginal removal of               cervical stump)   LAPAROSCOPIC LYSIS OF ADHESIONS                  08/09/2012      Comment:Procedure: LAPAROSCOPIC LYSIS OF ADHESIONS;                Surgeon: Lazaro Arms, MD;  Location: [facility];               Service: Gynecology;  Laterality: N/A;  Review of patient's family history indicates:   Hypertension                   Brother                  Heart disease                  Brother                    Comment: hole in heart   Anesthesia problems            Neg Hx                   Hypotension  Neg Hx                   Pseudochol deficiency          Neg Hx                   Malignant hyperthermia         Neg Hx                   Other                          Neg Hx                   Ovarian cancer                 Mother                   Social History   Marital Status: Married             Spouse Name:                      Years of Education:                 Number of children: 1           Occupational History Occupation          Associate Professor            Comment              unemployed                                Social History Main Topics   Smoking Status: Former  Smoker                   Packs/Day: 0.00  Years: 1         Types: Cigarettes     Quit date: 09/01/2009   Smokeless Status: Never Used                       Comment: smokes 1 pack a week   Alcohol Use: No             Drug Use: No             Sexual Activity: Yes                    Birth Control/Protection: Surgical  Other Topics            Concern   None on file  Social History Narrative   None on file    ROS: See history of present illness otherwise negative   PHYSICAL EXAM: Well-nournished, in no acute distress. Neck: No JVD, HJR, Bruit, or thyroid enlargement  Lungs: No tachypnea, clear without wheezing, rales, or rhonchi  Cardiovascular: Rapid rate and rhythm, PMI not displaced, heart sounds fast, no murmurs, gallops, bruit, thrill, or heave.  Abdomen: BS normal. Soft without organomegaly, masses, lesions or tenderness.  Extremities: without cyanosis, clubbing or edema. Good distal pulses bilateral  SKin: Warm, no lesions or rashes   Musculoskeletal: No deformities  Neuro: no focal signs  BP 120/80  Pulse 86  Ht 5\' 3"  (1.6 m)  Wt 120 lb (54.432 kg)  BMI 21.26 kg/m2  LMP  05/04/2011   EKG: Sinus tachycardia at 107 beats per minute   heart rate lying down was 100 sitting 131 standing 121. Blood pressure dropped from 112 to 100 from lying to sitting. Patient was symptomatic.

## 2014-02-11 ENCOUNTER — Telehealth: Payer: Self-pay | Admitting: Physician Assistant

## 2014-02-11 NOTE — Telephone Encounter (Signed)
Per chart, patient of Dr. Wyline MoodBranch and Dr. Ladona Ridgelaylor.

## 2014-02-11 NOTE — Telephone Encounter (Signed)
I have not seen this patient since October 2014. She has had mutliple medication changes since that time, appears Marcelino DusterMichelle and Dr Ladona Ridgelaylor have recently discussed her case and made even more recent changes. Please forward to Dr Ladona Ridgelaylor.  J ConocoPhillipsBranch

## 2014-02-11 NOTE — Telephone Encounter (Signed)
Pt Friend says she woke up 4am Sunday morning with chest pain and arm numbness. Stayed in the shower for 30 mins and took flecainide and xanax. Pt frind said she was not home but wanted to know what to do because she did not want to go to the hospital. Told pt friend that she should go if she is having chest pain and numbness. Please advise per patient request for your opinion.

## 2014-02-11 NOTE — Telephone Encounter (Signed)
Patient would like to discuss her symptoms from Saturday morning. / tgs

## 2014-02-12 ENCOUNTER — Encounter (HOSPITAL_COMMUNITY): Payer: Self-pay | Admitting: Emergency Medicine

## 2014-02-12 ENCOUNTER — Telehealth: Payer: Self-pay | Admitting: *Deleted

## 2014-02-12 ENCOUNTER — Emergency Department (HOSPITAL_COMMUNITY)
Admission: EM | Admit: 2014-02-12 | Discharge: 2014-02-12 | Disposition: A | Discharge status: Home / Self Care | Payer: Medicaid Other | Attending: Emergency Medicine | Admitting: Emergency Medicine

## 2014-02-12 ENCOUNTER — Encounter: Payer: Self-pay | Admitting: *Deleted

## 2014-02-12 ENCOUNTER — Emergency Department (HOSPITAL_COMMUNITY): Payer: Medicaid Other

## 2014-02-12 DIAGNOSIS — Z862 Personal history of diseases of the blood and blood-forming organs and certain disorders involving the immune mechanism: Secondary | ICD-10-CM | POA: Insufficient documentation

## 2014-02-12 DIAGNOSIS — R0789 Other chest pain: Secondary | ICD-10-CM | POA: Diagnosis not present

## 2014-02-12 DIAGNOSIS — R61 Generalized hyperhidrosis: Secondary | ICD-10-CM | POA: Diagnosis not present

## 2014-02-12 DIAGNOSIS — R011 Cardiac murmur, unspecified: Secondary | ICD-10-CM | POA: Diagnosis not present

## 2014-02-12 DIAGNOSIS — Z87891 Personal history of nicotine dependence: Secondary | ICD-10-CM | POA: Diagnosis not present

## 2014-02-12 DIAGNOSIS — Z8639 Personal history of other endocrine, nutritional and metabolic disease: Secondary | ICD-10-CM | POA: Insufficient documentation

## 2014-02-12 DIAGNOSIS — R079 Chest pain, unspecified: Secondary | ICD-10-CM | POA: Diagnosis present

## 2014-02-12 DIAGNOSIS — R002 Palpitations: Secondary | ICD-10-CM | POA: Diagnosis not present

## 2014-02-12 DIAGNOSIS — Z79899 Other long term (current) drug therapy: Secondary | ICD-10-CM | POA: Diagnosis not present

## 2014-02-12 DIAGNOSIS — Z8679 Personal history of other diseases of the circulatory system: Secondary | ICD-10-CM | POA: Diagnosis not present

## 2014-02-12 LAB — CBC
HCT: 38.8 % (ref 36.0–46.0)
Hemoglobin: 13.6 g/dL (ref 12.0–15.0)
MCH: 31.1 pg (ref 26.0–34.0)
MCHC: 35.1 g/dL (ref 30.0–36.0)
MCV: 88.8 fL (ref 78.0–100.0)
Platelets: 129 10*3/uL — ABNORMAL LOW (ref 150–400)
RBC: 4.37 MIL/uL (ref 3.87–5.11)
RDW: 13.1 % (ref 11.5–15.5)
WBC: 3.8 10*3/uL — ABNORMAL LOW (ref 4.0–10.5)

## 2014-02-12 LAB — BASIC METABOLIC PANEL
Anion gap: 10 (ref 5–15)
BUN: 11 mg/dL (ref 6–23)
CO2: 25 mEq/L (ref 19–32)
Calcium: 9 mg/dL (ref 8.4–10.5)
Chloride: 107 mEq/L (ref 96–112)
Creatinine, Ser: 0.99 mg/dL (ref 0.50–1.10)
GFR calc Af Amer: 88 mL/min — ABNORMAL LOW (ref >90)
GFR, EST NON AFRICAN AMERICAN: 76 mL/min — AB (ref >90)
Glucose, Bld: 91 mg/dL (ref 70–99)
Potassium: 3.8 mEq/L (ref 3.7–5.3)
SODIUM: 142 meq/L (ref 137–147)

## 2014-02-12 LAB — TROPONIN I

## 2014-02-12 MED ORDER — ALPRAZOLAM 0.25 MG PO TABS: 0.2500 mg | ORAL_TABLET | Freq: Three times a day (TID) | ORAL | Status: DC | PRN

## 2014-02-12 NOTE — Progress Notes (Unsigned)
Patient ID: Corliss MarcusCheryl Rubalcava, female   DOB: May 21, 1986, 28 y.o.   MRN: [id number]

## 2014-02-12 NOTE — ED Notes (Addendum)
Pt reports chest pain intermittently since Sunday. Pt reports current chest pain left chest radiating to left arm. Pt denies any sob at this time. Mild diaphoresis noted at time of triage. Pt reports chest pain is worse with a deep breath. Pt mildly anxious at time of assessment and reports dizziness with changing of positions.

## 2014-02-12 NOTE — Telephone Encounter (Signed)
Called pt to check on her as she didn't return my call from yesterday. She stated she is still feels pain and SOB and numbness that Xanax and flecainide seem to ease. Advised her to go to ED. Said she would try to convince herself to go

## 2014-02-12 NOTE — ED Provider Notes (Signed)
CSN: [id number]     Arrival date & time 02/12/14  4098 History  This chart was scribed for Benny Lennert, MD by Leone Payor, ED Scribe. This patient was seen in room [facility] and the patient's care was started 10:25 AM.    Chief Complaint  Patient presents with  . Chest Pain    Patient is a 28 y.o. female presenting with chest pain. The history is provided by the patient. No language interpreter was used.  Chest Pain Pain location:  L chest Pain radiates to:  L arm Pain radiates to the back: no   Pain severity:  Mild Duration:  2 days Timing:  Intermittent Progression:  Unchanged Context: stress   Ineffective treatments:  Rest Associated symptoms: diaphoresis and palpitations   Associated symptoms: no abdominal pain, no back pain, no cough, no fatigue and no headache     HPI Comments: Tara Mahoney is a 28 y.o. female with past medical history of mitral valve prolapse, HLD, dysrhythmia who presents to the Emergency Department complaining of 2 days of intermittent, left sided chest pain that radiates to the left arm. She reports one of the episodes lasted for about 45 min. She reports associated diaphoresis, dizziness, and nausea upon arriving to the ED which has since resolved. Patient states she takes flecainide 100 mg for dysrhythmia for the past several months and has been following up with Dr. Ladona Ridgel about this. She reports speaking with Dr. Lubertha Basque nurse who advised patient to come to the ED to be evaluated.   Patient states her mother passed away from an MI in the last week. She states her symptoms have worsened since then.   Cardiologist Dr. Wyline Mood and Dr. Ladona Ridgel  Past Medical History  Diagnosis Date  . MVP (mitral valve prolapse)   . Palpitations   . Hyperlipidemia   . Headache(784.0)   . Heart murmur   . Complication of anesthesia   . PONV (postoperative nausea and vomiting)   . Dysrhythmia     sinus arrythmia  . Family history of anesthesia complication    PONV   Past Surgical History  Procedure Laterality Date  . Cervical cerclage  2008  . Myringotomy    . Cervical cerclage  09/08/2011    Procedure: CERCLAGE CERVICAL;  Surgeon: Lazaro Arms, MD;  Location: [facility];  Service: Gynecology;  Laterality: N/A;  Tara Mahoney,CST-scrubbed; doctor scrubbed in @ [phone]  . Supracervical abdominal hysterectomy  01/26/2012    Procedure: HYSTERECTOMY SUPRACERVICAL ABDOMINAL;  Surgeon: Lazaro Arms, MD;  Location: [facility];  Service: Gynecology;;  converted [phone]  . Laparoscopy  08/09/2012    Procedure: LAPAROSCOPY DIAGNOSTIC;  Surgeon: Lazaro Arms, MD;  Location: [facility];  Service: Gynecology;  Laterality: N/A;  started at  1208-Vaginal trachelectomy (vaginal removal of cervical stump)   . Laparoscopic lysis of adhesions  08/09/2012    Procedure: LAPAROSCOPIC LYSIS OF ADHESIONS;  Surgeon: Lazaro Arms, MD;  Location: [facility];  Service: Gynecology;  Laterality: N/A;  . Abdominal hysterectomy     Family History  Problem Relation Age of Onset  . Hypertension Brother   . Heart disease Brother     hole in heart  . Anesthesia problems Neg Hx   . Hypotension Neg Hx   . Pseudochol deficiency Neg Hx   . Malignant hyperthermia Neg Hx   . Other Neg Hx   . Ovarian cancer Mother    History  Substance Use Topics  . Smoking status: Former Smoker --  1 years    Types: Cigarettes    Quit date: 09/01/2009  . Smokeless tobacco: Never Used     Comment: smokes 1 pack a week  . Alcohol Use: No   OB History   Grav Para Term Preterm Abortions TAB SAB Ect Mult Living   2 2 1 1      2      Review of Systems  Constitutional: Positive for diaphoresis. Negative for appetite change and fatigue.  HENT: Negative for congestion, ear discharge and sinus pressure.   Eyes: Negative for discharge.  Respiratory: Negative for cough.   Cardiovascular: Positive for chest pain and palpitations.  Gastrointestinal: Negative for abdominal pain and diarrhea.  Genitourinary:  Negative for frequency and hematuria.  Musculoskeletal: Negative for back pain.  Skin: Negative for rash.  Neurological: Negative for seizures and headaches.  Psychiatric/Behavioral: Negative for hallucinations.      Allergies  Cardizem cd; Hydrocodone-acetaminophen; Metoprolol; and Verapamil  Home Medications   Prior to Admission medications   Medication Sig Start Date End Date Taking? Authorizing Provider  ALPRAZolam (XANAX) 0.25 MG tablet Take 1 tablet (0.25 mg total) by mouth 3 (three) times daily as needed for anxiety. 02/06/14  Yes Dyann KiefMichele M Lenze, PA-C  flecainide (TAMBOCOR) 100 MG tablet Take 100 mg by mouth 3 (three) times daily. 05/22/13  Yes Marinus MawGregg W Taylor, MD   BP 110/79  Pulse 81  Temp(Src) 98.7 F (37.1 C) (Oral)  Resp 21  Ht 5\' 3"  (1.6 m)  Wt 115 lb (52.164 kg)  BMI 20.38 kg/m2  SpO2 99%  LMP 05/04/2011 Physical Exam  Nursing note and vitals reviewed. Constitutional: She is oriented to person, place, and time. She appears well-developed.  HENT:  Head: Normocephalic.  Eyes: Conjunctivae and EOM are normal. No scleral icterus.  Neck: Neck supple. No thyromegaly present.  Cardiovascular: Normal rate and regular rhythm.  Exam reveals no gallop and no friction rub.   Murmur heard.  Systolic murmur is present with a grade of 2/6  2/6 systolic ejection murmur    Pulmonary/Chest: Effort normal and breath sounds normal. No stridor. She has no wheezes. She has no rales. She exhibits no tenderness.  Abdominal: She exhibits no distension. There is no tenderness. There is no rebound.  Musculoskeletal: Normal range of motion. She exhibits no edema.  Lymphadenopathy:    She has no cervical adenopathy.  Neurological: She is oriented to person, place, and time. She exhibits normal muscle tone. Coordination normal.  Skin: No rash noted. No erythema.  Psychiatric: She has a normal mood and affect. Her behavior is normal.    ED Course  Procedures (including critical care  time)  DIAGNOSTIC STUDIES: Oxygen Saturation is 100% on RA, normal by my interpretation.    COORDINATION OF CARE: 10:37 AM Discussed treatment plan with pt at bedside and pt agreed to plan.   Labs Review Labs Reviewed  CBC - Abnormal; Notable for the following:    WBC 3.8 (*)    Platelets 129 (*)    All other components within normal limits  BASIC METABOLIC PANEL  TROPONIN I    Imaging Review Dg Chest Port 1 View  02/12/2014   CLINICAL DATA:  Chest pain  EXAM: PORTABLE CHEST - 1 VIEW  COMPARISON:  05/27/2013  FINDINGS: Cardiomediastinal silhouette is stable. No acute infiltrate or pleural effusion. No pulmonary edema. Bony thorax is unremarkable.  IMPRESSION: No active disease.   Electronically Signed   By: Natasha MeadLiviu  Pop M.D.   On: 02/12/2014  10:13     EKG Interpretation   Date/Time:  Tuesday February 12 2014 09:38:33 EDT Ventricular Rate:  96 PR Interval:  167 QRS Duration: 89 QT Interval:  371 QTC Calculation: 469 R Axis:   74 Text Interpretation:  Sinus rhythm Left atrial enlargement Baseline wander  in lead(s) V1 Confirmed by Jakiera Ehler  MD, Regnald Bowens [phone]) on 02/12/2014  12:42:20 PM      MDM   Final diagnoses:  None   The chart was scribed for me under my direct supervision.  I personally performed the history, physical, and medical decision making and all procedures in the evaluation of this patient.Benny Lennert, MD 02/12/14 1242

## 2014-02-12 NOTE — Telephone Encounter (Signed)
error 

## 2014-02-12 NOTE — ED Notes (Signed)
MD at bedside at this time.

## 2014-02-12 NOTE — Progress Notes (Signed)
    Primary cardiologists: Dr. Dina RichJonathan Branch and Dr. Lewayne BuntingGregg Taylor  Received phone call from Dr. Estell HarpinZammit in ED regarding patient. She was sent to ED per contact with nursing given symptoms of chest pain and palpitations. Recent office visit with Ms. Lilla ShookLenze PA-C on 7/22 reviewed as well. She has history of NSVT with normal LVEF, on Flecainide. Negative GXT 05/2013.   She has had recent psychosocial stress following the sudden death of her mother. Was given low dose Xanax per Dr. Ladona Ridgelaylor at office visit 7/22. No changes in antiarrhythmic medications. Repeat stress test was mentioned in the note, but not ordered.   ECG today shows NSR, no acute ST segment changes and troponin I negative, CXR without active disease.   Patient to be scheduled for an exercise echocardiogram within a week followed by office visit to review. Office to arrange and inform ED staff.   Jonelle SidleSamuel G. Yukio Bisping, M.D., F.A.C.C.

## 2014-02-12 NOTE — Discharge Instructions (Signed)
Follow up for echo on Friday at 9:30am at main entrance to hospital.   Follow up Monday at 1:30pm at office with Lorin PicketKathyrn Lawrence

## 2014-02-15 ENCOUNTER — Encounter (HOSPITAL_COMMUNITY): Payer: Self-pay

## 2014-02-15 ENCOUNTER — Other Ambulatory Visit (HOSPITAL_COMMUNITY): Payer: Medicaid Other

## 2014-02-15 ENCOUNTER — Ambulatory Visit (HOSPITAL_COMMUNITY)
Admit: 2014-02-15 | Discharge: 2014-02-15 | Disposition: A | Discharge status: Home / Self Care | Payer: Medicaid Other | Source: Ambulatory Visit | Attending: Cardiology | Admitting: Cardiology

## 2014-02-15 ENCOUNTER — Other Ambulatory Visit: Payer: Self-pay | Admitting: *Deleted

## 2014-02-15 DIAGNOSIS — R079 Chest pain, unspecified: Secondary | ICD-10-CM

## 2014-02-15 DIAGNOSIS — R072 Precordial pain: Secondary | ICD-10-CM

## 2014-02-15 NOTE — Progress Notes (Signed)
Stress Lab Nurses Notes - Tara Mahoney  Tara MarcusCheryl Mahoney 02/15/2014 Reason for doing test: Chest Pain Type of test: Stress Echo Nurse performing test: Tara PoissonPhyllis Billingsly, RN Nuclear Medicine Tech: Not Applicable Echo Tech: Tara Canningindy Rigg MD performing test: Tara Mahoney/Tara BishopLawrence NP Family MD: Tara Mahoney Test explained and consent signed: Yes.   IV started: No IV started Symptoms: Dizziness at first of recovery. Treatment/Intervention: None Reason test stopped: fatigue After recovery IV was: NA Patient to return to Nuc. Med at : NA Patient discharged: Home Patient's Condition upon discharge was: stable Comments: During test peak BP 123/80 & HR 184.  Recovery BP 109/75 & HR 103.  Symptoms resolved in recovery. Tara Mahoney, Tara Mahoney

## 2014-02-15 NOTE — Progress Notes (Signed)
  Echocardiogram Echocardiogram Stress Test has been performed.  Tara Mahoney 02/15/2014, 11:03 AM

## 2014-02-18 ENCOUNTER — Encounter: Payer: Self-pay | Admitting: *Deleted

## 2014-02-18 ENCOUNTER — Encounter: Payer: Medicaid Other | Admitting: Adult Health

## 2014-02-18 NOTE — Progress Notes (Signed)
    ERROR. No show 

## 2014-02-26 NOTE — Telephone Encounter (Signed)
Please schedule her to followup with me in the next 2-3 weeks. GT

## 2014-02-27 ENCOUNTER — Encounter: Payer: Self-pay | Admitting: Physician Assistant

## 2014-02-27 ENCOUNTER — Ambulatory Visit (INDEPENDENT_AMBULATORY_CARE_PROVIDER_SITE_OTHER): Payer: Medicaid Other | Admitting: Physician Assistant

## 2014-02-27 VITALS — BP 118/90 | HR 110 | Ht 63.0 in | Wt 121.0 lb

## 2014-02-27 DIAGNOSIS — R079 Chest pain, unspecified: Secondary | ICD-10-CM

## 2014-02-27 DIAGNOSIS — I472 Ventricular tachycardia: Secondary | ICD-10-CM

## 2014-02-27 DIAGNOSIS — I4729 Other ventricular tachycardia: Secondary | ICD-10-CM

## 2014-02-27 NOTE — Assessment & Plan Note (Addendum)
Patient was in the emergency room with atypical chest pain. MI was ruled out. Stress echo was normal. Any further workup per Dr. Ladona Ridgelaylor.

## 2014-02-27 NOTE — Assessment & Plan Note (Signed)
Patient has history of nonsustained V. tach controlled with flecainide. She is having dizziness and palpitations ever since the death of her mother. The Xanax has helped. I will give her  30 tablets to get her in until August 31. I will give her no refills. She should obtain any further refills from Dr. Ladona Ridgelaylor or a primary care who can follow her more closely. He can discuss further electrophysiology workup.

## 2014-02-27 NOTE — Patient Instructions (Signed)
Your physician recommends that you schedule a follow-up appointment in: with Dr.Taylor on 8/31 at 11:30 am      Your physician recommends that you continue on your current medications as directed. Please refer to the Current Medication list given to you today.      Thank you for choosing [facility] !

## 2014-02-27 NOTE — Progress Notes (Signed)
ZOX:WRUE is a 28 yr old female patient of Dr. Ladona Ridgel who has a history of palpitations and NSVT controlled with Flecainide 100mg  BID. I saw her on 02/06/14 at which time she was tubing with her mother who became unresponsive and died in her arms. Ever since then the patient has increased palpitations and lightheadedness. She's taken extra Flecainide for break through tachycardia which hasn't helped. She states that she's been having dizziness with change of position even before her mother died. She denies any chest pain or syncope, dyspnea. She states that she usually doesn't go outside and doesn't think she's dehydrated. Beta blockers have caused severe fatigue and sleepiness and Cardizem was ineffective.  I spoke with Dr. Ladona Ridgel who recommended Xanax as needed. She then presented to the emergency room after an episode of chest pain. MI was ruled out and stress echo was ordered. Stress echo was completely normal. She has followup with Dr. Ladona Ridgel August 31.  She comes in today accompanied by her aunt, who is a Engineer, civil (consulting) and is  wanting the patient to have further workup with possible ablation and cardiac catheterization. The patient continues to have dizziness and tachycardia. She is still having nightmares and is very stressed out over her mother's death. The Xanax has helped.  Allergies  Allergen Reactions  . Cardizem Cd [Diltiazem Hcl Er Beads]     rash  . Hydrocodone-Acetaminophen Itching and Nausea And Vomiting  . Metoprolol     Headaches, dizzyness  . Verapamil     Racing heart, dizzyness, headache,shaking all over her body      Current Outpatient Prescriptions  Medication Sig Dispense Refill  . ALPRAZolam (XANAX) 0.25 MG tablet Take 1 tablet (0.25 mg total) by mouth 3 (three) times daily as needed for anxiety.  20 tablet  0  . flecainide (TAMBOCOR) 100 MG tablet Take 100 mg by mouth 2 (two) times daily.        No current facility-administered medications for this visit.     Past Medical History  Diagnosis Date  . MVP (mitral valve prolapse)   . Palpitations   . Hyperlipidemia   . Headache(784.0)   . Heart murmur   . Complication of anesthesia   . PONV (postoperative nausea and vomiting)   . Dysrhythmia     sinus arrythmia  . Family history of anesthesia complication     PONV    Past Surgical History  Procedure Laterality Date  . Cervical cerclage  2008  . Myringotomy    . Cervical cerclage  09/08/2011    Procedure: CERCLAGE CERVICAL;  Surgeon: Lazaro Arms, MD;  Location: [facility];  Service: Gynecology;  Laterality: N/A;  [name],CST-scrubbed; doctor scrubbed in @ [phone]  . Supracervical abdominal hysterectomy  01/26/2012    Procedure: HYSTERECTOMY SUPRACERVICAL ABDOMINAL;  Surgeon: Lazaro Arms, MD;  Location: [facility];  Service: Gynecology;;  converted [phone]  . Laparoscopy  08/09/2012    Procedure: LAPAROSCOPY DIAGNOSTIC;  Surgeon: Lazaro Arms, MD;  Location: [facility];  Service: Gynecology;  Laterality: N/A;  started at  1208-Vaginal trachelectomy (vaginal removal of cervical stump)   . Laparoscopic lysis of adhesions  08/09/2012    Procedure: LAPAROSCOPIC LYSIS OF ADHESIONS;  Surgeon: Lazaro Arms, MD;  Location: [facility];  Service: Gynecology;  Laterality: N/A;  . Abdominal hysterectomy      Family History  Problem Relation Age of Onset  . Hypertension Brother   . Heart disease Brother  hole in heart  . Anesthesia problems Neg Hx   . Hypotension Neg Hx   . Pseudochol deficiency Neg Hx   . Malignant hyperthermia Neg Hx   . Other Neg Hx   . Ovarian cancer Mother     History   Social History  . Marital Status: Married    Spouse Name: N/A    Number of Children: 1  . Years of Education: N/A   Occupational History  . unemployed    Social History Main Topics  . Smoking status: Former Smoker -- 1 years    Types: Cigarettes    Quit date: 09/01/2009  . Smokeless tobacco: Never Used     Comment: smokes 1 pack a week  .  Alcohol Use: No  . Drug Use: No  . Sexual Activity: Yes    Birth Control/ Protection: Surgical   Other Topics Concern  . Not on file   Social History Narrative  . No narrative on file    ROS:  BP 118/90  Pulse 110  Ht 5\' 3"  (1.6 m)  Wt 121 lb (54.885 kg)  BMI 21.44 kg/m2  SpO2 99%  LMP 05/04/2011  PHYSICAL EXAM: Well-nournished, in no acute distress. Neck: No JVD, HJR, Bruit, or thyroid enlargement  Lungs: No tachypnea, clear without wheezing, rales, or rhonchi  Cardiovascular: RRR, PMI not displaced, heart sounds normal, no murmurs, gallops, bruit, thrill, or heave.  Abdomen: BS normal. Soft without organomegaly, masses, lesions or tenderness.  Extremities: without cyanosis, clubbing or edema. Good distal pulses bilateral  SKin: Warm, no lesions or rashes   Musculoskeletal: No deformities  Neuro: no focal signs   Wt Readings from Last 3 Encounters:  02/27/14 121 lb (54.885 kg)  02/12/14 115 lb (52.164 kg)  02/06/14 120 lb (54.432 kg)     EKG: Normal sinus rhythm, normal EKG  Stress echo 02/15/14 Study Conclusions  - Stress ECG conclusions: The stress ECG was normal. [facility] treadmill   score of 10, consistent with low risk for major cardiac events. - Staged echo: Normal echo stress - From notes patient is on flecanide. There is no QRS widening with   exercise.  Impressions:  - Normal study after maximal exercise.

## 2014-03-18 ENCOUNTER — Encounter: Payer: Self-pay | Admitting: Internal Medicine

## 2014-03-18 ENCOUNTER — Ambulatory Visit (INDEPENDENT_AMBULATORY_CARE_PROVIDER_SITE_OTHER): Payer: Medicaid Other | Admitting: Internal Medicine

## 2014-03-18 VITALS — BP 112/78 | HR 96 | Ht 63.0 in | Wt 120.0 lb

## 2014-03-18 DIAGNOSIS — I493 Ventricular premature depolarization: Secondary | ICD-10-CM

## 2014-03-18 DIAGNOSIS — R072 Precordial pain: Secondary | ICD-10-CM

## 2014-03-18 DIAGNOSIS — I4949 Other premature depolarization: Secondary | ICD-10-CM

## 2014-03-18 NOTE — Assessment & Plan Note (Signed)
These appear to be due to her palpitations rather than any other problems.

## 2014-03-18 NOTE — Progress Notes (Signed)
HPI [name] returns today for followup. She is a pleasant 28 yo woman with NSVT/PVC's and on multiple medications and has been reasonably well controlled with flecainide. She is intolerant of both beta blockers and calcium channel blockers. She has recenty lost her mother, watching her die while rafting down the [employer]. The trauma has been very difficult for her. Her palpitations initially worsened but appear to be improving. No chest pain or sob. No syncope. Allergies  Allergen Reactions  . Cardizem Cd [Diltiazem Hcl Er Beads]     rash  . Hydrocodone-Acetaminophen Itching and Nausea And Vomiting  . Metoprolol     Headaches, dizzyness  . Verapamil     Racing heart, dizzyness, headache,shaking all over her body      Current Outpatient Prescriptions  Medication Sig Dispense Refill  . flecainide (TAMBOCOR) 100 MG tablet Take 100 mg by mouth 2 (two) times daily.        No current facility-administered medications for this visit.     Past Medical History  Diagnosis Date  . MVP (mitral valve prolapse)   . Palpitations   . Hyperlipidemia   . Headache(784.0)   . Heart murmur   . Complication of anesthesia   . PONV (postoperative nausea and vomiting)   . Dysrhythmia     sinus arrythmia  . Family history of anesthesia complication     PONV    ROS:   All systems reviewed and negative except as noted in the HPI.   Past Surgical History  Procedure Laterality Date  . Cervical cerclage  2008  . Myringotomy    . Cervical cerclage  09/08/2011    Procedure: CERCLAGE CERVICAL;  Surgeon: Lazaro Arms, MD;  Location: [facility];  Service: Gynecology;  Laterality: N/A;  [name],CST-scrubbed; doctor scrubbed in @ [phone]  . Supracervical abdominal hysterectomy  01/26/2012    Procedure: HYSTERECTOMY SUPRACERVICAL ABDOMINAL;  Surgeon: Lazaro Arms, MD;  Location: [facility];  Service: Gynecology;;  converted [phone]  . Laparoscopy  08/09/2012    Procedure: LAPAROSCOPY DIAGNOSTIC;   Surgeon: Lazaro Arms, MD;  Location: [facility];  Service: Gynecology;  Laterality: N/A;  started at  1208-Vaginal trachelectomy (vaginal removal of cervical stump)   . Laparoscopic lysis of adhesions  08/09/2012    Procedure: LAPAROSCOPIC LYSIS OF ADHESIONS;  Surgeon: Lazaro Arms, MD;  Location: [facility];  Service: Gynecology;  Laterality: N/A;  . Abdominal hysterectomy       Family History  Problem Relation Age of Onset  . Hypertension Brother   . Heart disease Brother     hole in heart  . Anesthesia problems Neg Hx   . Hypotension Neg Hx   . Pseudochol deficiency Neg Hx   . Malignant hyperthermia Neg Hx   . Other Neg Hx   . Ovarian cancer Mother      History   Social History  . Marital Status: Married    Spouse Name: N/A    Number of Children: 1  . Years of Education: N/A   Occupational History  . unemployed    Social History Main Topics  . Smoking status: Former Smoker -- 1 years    Types: Cigarettes    Quit date: 09/01/2009  . Smokeless tobacco: Never Used     Comment: smokes 1 pack a week  . Alcohol Use: No  . Drug Use: No  . Sexual Activity: Yes    Birth Control/ Protection: Surgical   Other  Topics Concern  . Not on file   Social History Narrative  . No narrative on file     BP 112/78  Pulse 96  Ht  (1.6 m)  Wt 120 lb (54.432 kg)  BMI 21.26 kg/m2  SpO2 98%  LMP 05/04/2011  Physical Exam:  Well appearing 28 yo woman, NAD HEENT: Unremarkable Neck:  No JVD, no thyromegally Back:  No CVA tenderness Lungs:  Clear with no wheezes HEART:  Regular rate rhythm, no murmurs, no rubs, no clicks Abd:  soft, positive bowel sounds, no organomegally, no rebound, no guarding Ext:  2 plus pulses, no edema, no cyanosis, no clubbing Skin:  No rashes no nodules Neuro:  CN II through XII intact, motor grossly intact  Assess/Plan:

## 2014-03-18 NOTE — Patient Instructions (Signed)
Your physician wants you to follow-up in: 6 months with Dr. Taylor. You will receive a reminder letter in the mail two months in advance. If you don't receive a letter, please call our office to schedule the follow-up appointment.  Your physician recommends that you continue on your current medications as directed. Please refer to the Current Medication list given to you today.  

## 2014-03-18 NOTE — Assessment & Plan Note (Signed)
She has had some breakthrough palpitations since her mother died. These appear to be improving but are still present. I offered her the option of catheter ablation but did note the limitations. She would like to undergo watchful waiting.

## 2014-04-14 ENCOUNTER — Other Ambulatory Visit: Payer: Self-pay | Admitting: Internal Medicine

## 2014-04-19 ENCOUNTER — Ambulatory Visit: Payer: Medicaid Other | Admitting: Internal Medicine

## 2014-05-15 ENCOUNTER — Ambulatory Visit (INDEPENDENT_AMBULATORY_CARE_PROVIDER_SITE_OTHER): Payer: Medicaid Other | Admitting: Adult Health

## 2014-05-15 ENCOUNTER — Encounter: Payer: Self-pay | Admitting: Adult Health

## 2014-05-15 ENCOUNTER — Telehealth: Payer: Self-pay | Admitting: *Deleted

## 2014-05-15 VITALS — BP 120/78 | HR 110 | Ht 63.0 in | Wt 130.0 lb

## 2014-05-15 DIAGNOSIS — I471 Supraventricular tachycardia: Secondary | ICD-10-CM

## 2014-05-15 DIAGNOSIS — R000 Tachycardia, unspecified: Secondary | ICD-10-CM | POA: Diagnosis not present

## 2014-05-15 NOTE — Telephone Encounter (Signed)
Apt made with K.Lawrence NP for 1:50 pm today.Pt told told front staff she is unreachable by phone for the next hr

## 2014-05-15 NOTE — Telephone Encounter (Signed)
Pt HR has been running 130 and she has been very tired.

## 2014-05-15 NOTE — Assessment & Plan Note (Signed)
This is multifactorial with use of caffeine to include SunDrop cola and other drinks, along with anxiety. She continues to take flecainide 100mg  BID. Today in office HR is 96 per EKG at rest.   I have placed a cardiac monitor on her to assess her HR for 7 days, as she states her HR does not always go up every day. I will not add medications at this time. She is advised to eliminate caffeine from her diet entirely, as she may be very sensitive to this. Follow up appt after monitor returned.

## 2014-05-15 NOTE — Patient Instructions (Signed)
Your physician recommends that you schedule a follow-up appointment in: 2 weeks with Joni ReiningKathryn Lawrence, NP or Dr. Ladona Ridgelaylor  Your physician recommends that you continue on your current medications as directed. Please refer to the Current Medication list given to you today.  Your physician has recommended that you wear an event monitor for 1 week. Event monitors are medical devices that record the heart's electrical activity. Doctors most often us these monitors to diagnose arrhythmias. Arrhythmias are problems with the speed or rhythm of the heartbeat. The monitor is a small, portable device. You can wear one while you do your normal daily activities. This is usually used to diagnose what is causing palpitations/syncope (passing out).  PLEASE AVOID CAFFEINE  Thank you for choosing  [facility]!!

## 2014-05-15 NOTE — Progress Notes (Deleted)
Name: Tara Mahoney    DOB: 1986-01-19  Age: 28 y.o.  MR#: [id number]       PCP:  Isabella Stalling, MD      Insurance: Payor: MEDICAID [place] / Plan: MEDICAID [place] ACCESS / Product Type: *No Product type* /   CC:    Chief Complaint  Patient presents with  . Tachycardia    VS Filed Vitals:   05/15/14 1354  BP: 120/78  Pulse: 110  Height: 5\' 3"  (1.6 m)  Weight: 130 lb (58.968 kg)    Weights Current Weight  05/15/14 130 lb (58.968 kg)  03/18/14 120 lb (54.432 kg)  02/27/14 121 lb (54.885 kg)    Blood Pressure  BP Readings from Last 3 Encounters:  05/15/14 120/78  03/18/14 112/78  02/27/14 118/90     Admit date:  (Not on file) Last encounter with RMR:  Visit date not found   Allergy Cardizem cd; Hydrocodone-acetaminophen; Metoprolol; and Verapamil  Current Outpatient Prescriptions  Medication Sig Dispense Refill  . ALPRAZolam (XANAX) 0.25 MG tablet Take 0.25 mg by mouth 3 (three) times daily. 1/2 dose am/afternoon whole dose at night      . flecainide (TAMBOCOR) 100 MG tablet TAKE ONE TABLET BY MOUTH TWICE A DAY  60 tablet  11  . sertraline (ZOLOFT) 100 MG tablet Take 100 mg by mouth daily.       No current facility-administered medications for this visit.    Discontinued Meds:   There are no discontinued medications.  Patient Active Problem List   Diagnosis Date Noted  . Chest pain 02/27/2014  . Sinus tachycardia 02/06/2014  . PVC (premature ventricular contraction) 05/09/2013  . PAC (premature atrial contraction) 05/09/2013  . Nonsustained ventricular tachycardia 05/09/2013  . Abdominal pain, left lower quadrant 02/20/2013  . MITRAL VALVE PROLAPSE 09/23/2010  . PALPITATIONS 09/23/2010  . DYSPNEA 09/23/2010    LABS    Component Value Date/Time   NA 142 02/12/2014 1004   NA 137 05/27/2013 2118   NA 138 04/11/2013 1034   K 3.8 02/12/2014 1004   K 3.7 05/27/2013 2118   K 4.4 04/11/2013 1034   CL 107 02/12/2014 1004   CL 101 05/27/2013 2118   CL 105 04/11/2013  1034   CO2 25 02/12/2014 1004   CO2 27 05/27/2013 2118   CO2 27 04/11/2013 1034   GLUCOSE 91 02/12/2014 1004   GLUCOSE 97 05/27/2013 2118   GLUCOSE 72 04/11/2013 1034   BUN 11 02/12/2014 1004   BUN 14 05/27/2013 2118   BUN 14 04/11/2013 1034   CREATININE 0.99 02/12/2014 1004   CREATININE 0.96 05/27/2013 2118   CREATININE 0.86 04/11/2013 1034   CREATININE 0.91 08/03/2012 1050   CALCIUM 9.0 02/12/2014 1004   CALCIUM 10.1 05/27/2013 2118   CALCIUM 9.9 04/11/2013 1034   GFRNONAA 76* 02/12/2014 1004   GFRNONAA 79* 05/27/2013 2118   GFRNONAA 85* 08/03/2012 1050   GFRAA 88* 02/12/2014 1004   GFRAA >90 05/27/2013 2118   GFRAA >90 08/03/2012 1050   CMP     Component Value Date/Time   NA 142 02/12/2014 1004   K 3.8 02/12/2014 1004   CL 107 02/12/2014 1004   CO2 25 02/12/2014 1004   GLUCOSE 91 02/12/2014 1004   BUN 11 02/12/2014 1004   CREATININE 0.99 02/12/2014 1004   CREATININE 0.86 04/11/2013 1034   CALCIUM 9.0 02/12/2014 1004   PROT 7.1 04/11/2013 1034   ALBUMIN 4.6 04/11/2013 1034   AST 14 04/11/2013 1034  ALT 8 04/11/2013 1034   ALKPHOS 51 04/11/2013 1034   BILITOT 0.6 04/11/2013 1034   GFRNONAA 76* 02/12/2014 1004   GFRAA 88* 02/12/2014 1004       Component Value Date/Time   WBC 3.8* 02/12/2014 1004   WBC 7.0 05/27/2013 2118   WBC 4.7 08/03/2012 1050   HGB 13.6 02/12/2014 1004   HGB 14.6 05/27/2013 2118   HGB 14.7 08/03/2012 1050   HCT 38.8 02/12/2014 1004   HCT 41.4 05/27/2013 2118   HCT 41.4 08/03/2012 1050   MCV 88.8 02/12/2014 1004   MCV 88.5 05/27/2013 2118   MCV 88.3 08/03/2012 1050    Lipid Panel  No results found for this basename: chol, trig, hdl, cholhdl, vldl, ldlcalc, ldldirect    ABG No results found for this basename: phart, pco2, pco2art, po2, po2art, hco3, tco2, acidbasedef, o2sat     Lab Results  Component Value Date   TSH 1.272 04/11/2013   BNP (last 3 results) No results found for this basename: PROBNP,  in the last 8760 hours Cardiac Panel (last 3 results) No results found  for this basename: CKTOTAL, CKMB, TROPONINI, RELINDX,  in the last 72 hours  Iron/TIBC/Ferritin/ %Sat No results found for this basename: iron, tibc, ferritin, ironpctsat     EKG Orders placed in visit on 02/27/14  . EKG 12-LEAD     Prior Assessment and Plan Problem List as of 05/15/2014     Cardiovascular and Mediastinum   MITRAL VALVE PROLAPSE   PVC (premature ventricular contraction)   Last Assessment & Plan   03/18/2014 Office Visit Written 03/18/2014 10:12 PM by Marinus MawGregg W Taylor, MD     She has had some breakthrough palpitations since her mother died. These appear to be improving but are still present. I offered her the option of catheter ablation but did note the limitations. She would like to undergo watchful waiting.     PAC (premature atrial contraction)   Nonsustained ventricular tachycardia   Last Assessment & Plan   02/27/2014 Office Visit Written 02/27/2014 12:23 PM by Dyann KiefMichele M Lenze, PA-C     Patient has history of nonsustained V. tach controlled with flecainide. She is having dizziness and palpitations ever since the death of her mother. The Xanax has helped. I will give her  30 tablets to get her in until August 31. I will give her no refills. She should obtain any further refills from Dr. Ladona Ridgelaylor or a primary care who can follow her more closely. He can discuss further electrophysiology workup.      Other   PALPITATIONS   Last Assessment & Plan   05/09/2013 Office Visit Written 05/09/2013 12:42 PM by Marinus MawGregg W Taylor, MD     The patient has palpitations which I suspect are multifactorial. She has clear-cut PVCs, PACs, and nonsustained ventricular tachycardia. That her symptoms were initially much improved with low-dose beta blocker, and that her symptoms have worsened with time, suggest that we should be well-controlled. I've asked the patient to maintain a low caffeine diet. To limit any alcohol consumption. We will try her on verapamil, but if it is ineffective, or result in  side effects as Cardizem did, we will switch her to flecainide 50 mg twice daily. She may require up titration. I will see the patient back in approximately 12 weeks. She is instructed to call us and let us know how she is doing. I've asked the patient to walk for exercise.    DYSPNEA   Abdominal pain,  left lower quadrant   Sinus tachycardia   Last Assessment & Plan   02/06/2014 Office Visit Written 02/06/2014 12:39 PM by Dyann KiefMichele M Lenze, PA-C     Patient is in sinus tachycardia at rest. She did experience her mother dying in her arms 2 days ago and is under an enormous amount of stress. She does not feel like she is dehydrated but with her resting symptoms and after discussing with Dr. Ladona Ridgelaylor we feel like she needs extra fluids. Recommend drinking extra water and Gatorade today. Dr. Ladona Ridgelaylor also recommends low-dose Xanax as needed for the next several days. We will give her prescription for 0.25 mg with 20 tablets no refills. The patient does not like taking pills and says she may not fill it but would like to have it if needed. She has not slept in over 2 days. We will do a stress test within the next month and she will followup with Dr. Ladona Ridgelaylor    Chest pain   Last Assessment & Plan   03/18/2014 Office Visit Written 03/18/2014 10:12 PM by Marinus MawGregg W Taylor, MD     These appear to be due to her palpitations rather than any other problems.        Imaging: No results found.

## 2014-05-15 NOTE — Progress Notes (Signed)
HPI: Tara Mahoney is a [age] y/o patient of Dr. 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 She has significant post-traumatic issues concerning witnessing the death of her mother while tubing [place] the [place]. Since that time she has been having a lot of anxiety attacks.   Most recent stress/echo completed 02/15/2014 demonstrated normal study with normal EF of 55%-60%. She comes today with recurrent symptoms of racing HR.   She states her HR was 130 bpm while in her psychiatrist office today, per vital signs there. They did not do an EKG.  She states that her HR continues to race with and without anxiety. She admits to drinking Sun Drops and other caffeine drinks.  She states that she has nightmares waking up with her heart pounding.  She has recently been placed on Zoloft and Remeron at HS.   Allergies  Allergen Reactions  . Cardizem Cd [Diltiazem Hcl Er Beads]     rash  . Hydrocodone-Acetaminophen Itching and Nausea And Vomiting  . Metoprolol     Headaches, dizzyness  . Verapamil     Racing heart, dizzyness, headache,shaking all over her body     Current Outpatient Prescriptions  Medication Sig Dispense Refill  . ALPRAZolam (XANAX) 0.25 MG tablet Take 0.25 mg by mouth 3 (three) times daily. 1/2 dose am/afternoon whole dose at night      . flecainide (TAMBOCOR) 100 MG tablet TAKE ONE TABLET BY MOUTH TWICE A DAY  60 tablet  11  . mirtazapine (REMERON) 15 MG tablet Take 15 mg by mouth at bedtime.      . sertraline (ZOLOFT) 100 MG tablet Take 100 mg by mouth daily.       No current facility-administered medications for this visit.    Past Medical History  Diagnosis Date  . MVP (mitral valve prolapse)   . Palpitations   . Hyperlipidemia   . Headache(784.0)   . Heart murmur   . Complication of anesthesia   . PONV (postoperative nausea and vomiting)   . Dysrhythmia     sinus arrythmia  . Family history of anesthesia complication     PONV     Past Surgical History  Procedure Laterality Date  . Cervical cerclage  2008  . Myringotomy    . Cervical cerclage  09/08/2011    Procedure: CERCLAGE CERVICAL;  Surgeon: Lazaro ArmsLuther H Eure, MD;  Location: [facility];  Service: Gynecology;  Laterality: N/A;  [name],CST-scrubbed; doctor scrubbed in @ [phone]  . Supracervical abdominal hysterectomy  01/26/2012    Procedure: HYSTERECTOMY SUPRACERVICAL ABDOMINAL;  Surgeon: Lazaro ArmsLuther H Eure, MD;  Location: [facility];  Service: Gynecology;;  converted [phone]  . Laparoscopy  08/09/2012    Procedure: LAPAROSCOPY DIAGNOSTIC;  Surgeon: Lazaro ArmsLuther H Eure, MD;  Location: [facility];  Service: Gynecology;  Laterality: N/A;  started at  1208-Vaginal trachelectomy (vaginal removal of cervical stump)   . Laparoscopic lysis of adhesions  08/09/2012    Procedure: LAPAROSCOPIC LYSIS OF ADHESIONS;  Surgeon: Lazaro ArmsLuther H Eure, MD;  Location: [facility];  Service: Gynecology;  Laterality: N/A;  . Abdominal hysterectomy      ROS: Review of systems complete and found to be negative unless listed above  PHYSICAL EXAM BP 120/78  Pulse 110  Ht 5\' 3"  (1.6 m)  Wt 130 lb (58.968 kg)  BMI 23.03 kg/m2  LMP 05/04/2011 General: Well developed, well nourished, in no acute distress Head: Eyes PERRLA, No xanthomas.   Normal  cephalic and atramatic  Lungs: Clear bilaterally to auscultation and percussion. Heart: HRRR S1 S2, tachycardic without MRG.  Pulses are 2+ & equal.            No carotid bruit. No JVD.  No abdominal bruits. No femoral bruits. Abdomen: Bowel sounds are positive, abdomen soft and non-tender without masses or                  Hernia's noted. Msk:  Back normal, normal gait. Normal strength and tone for age. Extremities: No clubbing, cyanosis or edema.  DP +1 Neuro: Alert and oriented X 3. Psych:  Good affect, responds appropriately   EKG: Sinus tachycardia rate of 96 bpm.   ASSESSMENT AND PLAN

## 2014-05-16 ENCOUNTER — Telehealth: Payer: Self-pay | Admitting: Adult Health

## 2014-05-16 NOTE — Telephone Encounter (Signed)
Patient has questions about event monitor she is wearing. / tgs

## 2014-05-16 NOTE — Telephone Encounter (Signed)
Pt monitor was beeping during the night. Instructed pt to call [employer] for troubleshooting. Also told pt to make sure monitor was charged. Confirmed pt had number for e[employer]. Pt understood and will call for any further problems

## 2014-05-20 ENCOUNTER — Encounter: Payer: Self-pay | Admitting: Adult Health

## 2014-05-23 ENCOUNTER — Telehealth: Payer: Self-pay | Admitting: *Deleted

## 2014-05-23 NOTE — Telephone Encounter (Signed)
EOS event monitor received. In Dr. Wyline MoodBranch folder DOD as Dr. Ladona Ridgelaylor not in office until 11/12

## 2014-05-24 ENCOUNTER — Other Ambulatory Visit: Payer: Self-pay | Admitting: *Deleted

## 2014-05-24 DIAGNOSIS — R002 Palpitations: Secondary | ICD-10-CM

## 2014-05-27 ENCOUNTER — Telehealth: Payer: Self-pay | Admitting: Internal Medicine

## 2014-05-27 NOTE — Telephone Encounter (Signed)
Patient described her heart was "flopping like a fish" did not count heart rate though.No sx's now. Has follow up apt tomorrow

## 2014-05-27 NOTE — Telephone Encounter (Signed)
Patient had an episode last night with "fluttering" / tgs

## 2014-05-28 ENCOUNTER — Encounter: Payer: Self-pay | Admitting: Adult Health

## 2014-05-28 ENCOUNTER — Ambulatory Visit (INDEPENDENT_AMBULATORY_CARE_PROVIDER_SITE_OTHER): Payer: Medicaid Other | Admitting: Adult Health

## 2014-05-28 VITALS — BP 128/92 | HR 120 | Ht 63.0 in | Wt 130.0 lb

## 2014-05-28 DIAGNOSIS — I059 Rheumatic mitral valve disease, unspecified: Secondary | ICD-10-CM

## 2014-05-28 DIAGNOSIS — I472 Ventricular tachycardia: Secondary | ICD-10-CM

## 2014-05-28 DIAGNOSIS — I4729 Other ventricular tachycardia: Secondary | ICD-10-CM

## 2014-05-28 MED ORDER — FLECAINIDE ACETATE 100 MG PO TABS: 150.0000 mg | ORAL_TABLET | Freq: Two times a day (BID) | ORAL | Status: DC

## 2014-05-28 NOTE — Progress Notes (Signed)
HPI: Tara Mahoney is a 28 y/o patient of Dr. 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 She is being followed by psychiatry and is having a lot of anxiety attacks. She did have a stress/ echocardiogram in July 2015, which was normal, with EF of 55-60%, and no evidence of ischemia. On last office visit on 05/15/2014.she stated that her heart rate was 130 beats per minute while in her psychiatrist's office. The patient also has not stopped drinking caffeinated drinks, and consumes Sun Drop soft drinks.   On last office visit I repeated her cardiac monitor for one week in duration. The patient did activate the monitor, stating that she was having chest pressure or filling chest fluttering with evidence of heart rate being elevated on one instance at 128 beats per minute, at other time, 113 beats per minute, another time when she complained of chest pain or pressure. Her heart rate was normal. Highest heart rate was found to be 137 beats per minute around 2:30 in the afternoon, patient was bending and standing at the time.  She called over the weekend, when the monitor was off with symptoms of elevated HR and "flipping" feeling. She was not anxious, and was lying in bed when this occurred. She has totally stopped all caffeine.     Allergies  Allergen Reactions  . Cardizem Cd [Diltiazem Hcl Er Beads]     rash  . Hydrocodone-Acetaminophen Itching and Nausea And Vomiting  . Metoprolol     Headaches, dizzyness  . Morphine And Related     nausea  . Verapamil     Racing heart, dizzyness, headache,shaking all over her body     Current Outpatient Prescriptions  Medication Sig Dispense Refill  . ALPRAZolam (XANAX) 0.25 MG tablet Take 0.25 mg by mouth 3 (three) times daily. 1/2 dose am/afternoon whole dose at night    . flecainide (TAMBOCOR) 100 MG tablet TAKE ONE TABLET  BY MOUTH TWICE A DAY 60 tablet 11  . mirtazapine (REMERON) 15 MG tablet Take 15 mg by mouth at bedtime.    . sertraline (ZOLOFT) 100 MG tablet Take 100 mg by mouth daily.     No current facility-administered medications for this visit.    Past Medical History  Diagnosis Date  . MVP (mitral valve prolapse)   . Palpitations   . Hyperlipidemia   . Headache(784.0)   . Heart murmur   . Complication of anesthesia   . PONV (postoperative nausea and vomiting)   . Dysrhythmia     sinus arrythmia  . Family history of anesthesia complication     PONV    Past Surgical History  Procedure Laterality Date  . Cervical cerclage  2008  . Myringotomy    . Cervical cerclage  09/08/2011    Procedure: CERCLAGE CERVICAL;  Surgeon: Lazaro ArmsLuther H Eure, MD;  Location: [facility];  Service: Gynecology;  Laterality: N/A;  [name],CST-scrubbed; doctor scrubbed in @ [phone]  . Supracervical abdominal hysterectomy  01/26/2012    Procedure: HYSTERECTOMY SUPRACERVICAL ABDOMINAL;  Surgeon: Lazaro ArmsLuther H Eure, MD;  Location: [facility];  Service: Gynecology;;  converted [phone]  . Laparoscopy  08/09/2012    Procedure: LAPAROSCOPY DIAGNOSTIC;  Surgeon: Lazaro ArmsLuther H Eure, MD;  Location: [facility];  Service: Gynecology;  Laterality: N/A;  started at  1208-Vaginal trachelectomy (vaginal removal of cervical stump)   . Laparoscopic  lysis of adhesions  08/09/2012    Procedure: LAPAROSCOPIC LYSIS OF ADHESIONS;  Surgeon: Lazaro ArmsLuther H Eure, MD;  Location: [facility];  Service: Gynecology;  Laterality: N/A;  . Abdominal hysterectomy      ROS: Review of systems complete and found to be negative unless listed above  PHYSICAL EXAM BP 128/92 mmHg  Pulse 120  Ht 5\' 3"  (1.6 m)  Wt 130 lb (58.968 kg)  BMI 23.03 kg/m2  LMP 05/04/2011 General: Well developed, well nourished, in no acute distress Head: Eyes PERRLA, No xanthomas.   Normal cephalic and atramatic  Lungs: Clear bilaterally to auscultation and percussion. Heart: HRRR tachycardic S1 S2, without  MRG.  Pulses are 2+ & equal.            No carotid bruit. No JVD.  No abdominal bruits. No femoral bruits. Abdomen: Bowel sounds are positive, abdomen soft and non-tender without masses or                  Hernia's noted. Msk:  Back normal, normal gait. Normal strength and tone for age. Extremities: No clubbing, cyanosis or edema.  DP +1 Neuro: Alert and oriented X 3. Psych:  Good affect, responds appropriately   ASSESSMENT AND PLAN

## 2014-05-28 NOTE — Patient Instructions (Signed)
Your physician recommends that you schedule a follow-up appointment in: Dr.Taylor      INCREASE Flecainide to 150 mg twice a day        Thank you for choosing [facility] !

## 2014-05-28 NOTE — Assessment & Plan Note (Addendum)
I have reviewed the cardiac monitor telemetry strips with the patient and collaborated with her concerning her activity around the time of the elevated HR. She states that she was not stressed at any of the times her HR went up. She is staying off of caffeine.   I will increase flecainide to 150 mg BID from 100 mg BID. I will sent her back to see Dr. Ladona Ridgelaylor to review other options concerning treatment to include possible ablation vs medical management. She is intolerant to BB, CCB with dizziness, headaches. I am not sure all of this is related to PTSD, but could be contributing.   I have given her a copy of her cardiac monitor strips and results.

## 2014-05-28 NOTE — Progress Notes (Deleted)
Name: Tara MarcusCheryl Gulotta    DOB: March 08, 1986  Age: 28 y.o.  MR#: [id number]       PCP:  Isabella StallingNDIEGO,RICHARD M, MD      Insurance: Payor: MEDICAID [place] / Plan: MEDICAID [place] ACCESS / Product Type: *No Product type* /   CC:    Chief Complaint  Patient presents with  . Tachycardia    PSVT  . Palpitations    VS Filed Vitals:   05/28/14 1310  BP: 128/92  Pulse: 120  Height: 5\' 3"  (1.6 m)  Weight: 130 lb (58.968 kg)    Weights Current Weight  05/28/14 130 lb (58.968 kg)  05/15/14 130 lb (58.968 kg)  03/18/14 120 lb (54.432 kg)    Blood Pressure  BP Readings from Last 3 Encounters:  05/28/14 128/92  05/15/14 120/78  03/18/14 112/78     Admit date:  (Not on file) Last encounter with RMR:  05/16/2014   Allergy Cardizem cd; Hydrocodone-acetaminophen; Metoprolol; Morphine and related; and Verapamil  Current Outpatient Prescriptions  Medication Sig Dispense Refill  . ALPRAZolam (XANAX) 0.25 MG tablet Take 0.25 mg by mouth 3 (three) times daily. 1/2 dose am/afternoon whole dose at night    . flecainide (TAMBOCOR) 100 MG tablet TAKE ONE TABLET BY MOUTH TWICE A DAY 60 tablet 11  . mirtazapine (REMERON) 15 MG tablet Take 15 mg by mouth at bedtime.    . sertraline (ZOLOFT) 100 MG tablet Take 100 mg by mouth daily.     No current facility-administered medications for this visit.    Discontinued Meds:   There are no discontinued medications.  Patient Active Problem List   Diagnosis Date Noted  . Chest pain 02/27/2014  . Sinus tachycardia 02/06/2014  . PVC (premature ventricular contraction) 05/09/2013  . PAC (premature atrial contraction) 05/09/2013  . Nonsustained ventricular tachycardia 05/09/2013  . Abdominal pain, left lower quadrant 02/20/2013  . MITRAL VALVE PROLAPSE 09/23/2010  . PALPITATIONS 09/23/2010    LABS    Component Value Date/Time   NA 142 02/12/2014 1004   NA 137 05/27/2013 2118   NA 138 04/11/2013 1034   K 3.8 02/12/2014 1004   K 3.7 05/27/2013 2118   K  4.4 04/11/2013 1034   CL 107 02/12/2014 1004   CL 101 05/27/2013 2118   CL 105 04/11/2013 1034   CO2 25 02/12/2014 1004   CO2 27 05/27/2013 2118   CO2 27 04/11/2013 1034   GLUCOSE 91 02/12/2014 1004   GLUCOSE 97 05/27/2013 2118   GLUCOSE 72 04/11/2013 1034   BUN 11 02/12/2014 1004   BUN 14 05/27/2013 2118   BUN 14 04/11/2013 1034   CREATININE 0.99 02/12/2014 1004   CREATININE 0.96 05/27/2013 2118   CREATININE 0.86 04/11/2013 1034   CREATININE 0.91 08/03/2012 1050   CALCIUM 9.0 02/12/2014 1004   CALCIUM 10.1 05/27/2013 2118   CALCIUM 9.9 04/11/2013 1034   GFRNONAA 76* 02/12/2014 1004   GFRNONAA 79* 05/27/2013 2118   GFRNONAA 85* 08/03/2012 1050   GFRAA 88* 02/12/2014 1004   GFRAA >90 05/27/2013 2118   GFRAA >90 08/03/2012 1050   CMP     Component Value Date/Time   NA 142 02/12/2014 1004   K 3.8 02/12/2014 1004   CL 107 02/12/2014 1004   CO2 25 02/12/2014 1004   GLUCOSE 91 02/12/2014 1004   BUN 11 02/12/2014 1004   CREATININE 0.99 02/12/2014 1004   CREATININE 0.86 04/11/2013 1034   CALCIUM 9.0 02/12/2014 1004   PROT 7.1 04/11/2013 1034  ALBUMIN 4.6 04/11/2013 1034   AST 14 04/11/2013 1034   ALT 8 04/11/2013 1034   ALKPHOS 51 04/11/2013 1034   BILITOT 0.6 04/11/2013 1034   GFRNONAA 76* 02/12/2014 1004   GFRAA 88* 02/12/2014 1004       Component Value Date/Time   WBC 3.8* 02/12/2014 1004   WBC 7.0 05/27/2013 2118   WBC 4.7 08/03/2012 1050   HGB 13.6 02/12/2014 1004   HGB 14.6 05/27/2013 2118   HGB 14.7 08/03/2012 1050   HCT 38.8 02/12/2014 1004   HCT 41.4 05/27/2013 2118   HCT 41.4 08/03/2012 1050   MCV 88.8 02/12/2014 1004   MCV 88.5 05/27/2013 2118   MCV 88.3 08/03/2012 1050    Lipid Panel  No results found for: CHOL, TRIG, HDL, CHOLHDL, VLDL, LDLCALC, LDLDIRECT  ABG No results found for: PHART, PCO2ART, PO2ART, HCO3, TCO2, ACIDBASEDEF, O2SAT   Lab Results  Component Value Date   TSH 1.272 04/11/2013   BNP (last 3 results) No results for  input(s): PROBNP in the last 8760 hours. Cardiac Panel (last 3 results) No results for input(s): CKTOTAL, CKMB, TROPONINI, RELINDX in the last 72 hours.  Iron/TIBC/Ferritin/ %Sat No results found for: IRON, TIBC, FERRITIN, IRONPCTSAT   EKG Orders placed or performed in visit on 05/24/14  . Holter monitor - 24 hour     Prior Assessment and Plan Problem List as of 05/28/2014      Cardiovascular and Mediastinum   MITRAL VALVE PROLAPSE   PVC (premature ventricular contraction)   Last Assessment & Plan   03/18/2014 Office Visit Written 03/18/2014 10:12 PM by Marinus Maw, MD    She has had some breakthrough palpitations since her mother died. These appear to be improving but are still present. I offered her the option of catheter ablation but did note the limitations. She would like to undergo watchful waiting.     PAC (premature atrial contraction)   Nonsustained ventricular tachycardia   Last Assessment & Plan   02/27/2014 Office Visit Written 02/27/2014 12:23 PM by Dyann Kief, PA-C    Patient has history of nonsustained V. tach controlled with flecainide. She is having dizziness and palpitations ever since the death of her mother. The Xanax has helped. I will give her  30 tablets to get her in until August 31. I will give her no refills. She should obtain any further refills from Dr. Ladona Ridgel or a primary care who can follow her more closely. He can discuss further electrophysiology workup.      Other   PALPITATIONS   Last Assessment & Plan   05/09/2013 Office Visit Written 05/09/2013 12:42 PM by Marinus Maw, MD    The patient has palpitations which I suspect are multifactorial. She has clear-cut PVCs, PACs, and nonsustained ventricular tachycardia. That her symptoms were initially much improved with low-dose beta blocker, and that her symptoms have worsened with time, suggest that we should be well-controlled. I've asked the patient to maintain a low caffeine diet. To limit any  alcohol consumption. We will try her on verapamil, but if it is ineffective, or result in side effects as Cardizem did, we will switch her to flecainide 50 mg twice daily. She may require up titration. I will see the patient back in approximately 12 weeks. She is instructed to call us and let us know how she is doing. I've asked the patient to walk for exercise.    Abdominal pain, left lower quadrant   Sinus tachycardia  Last Assessment & Plan   05/15/2014 Office Visit Written 05/15/2014  3:03 PM by Jodelle GrossKathryn M Lawrence, NP    This is multifactorial with use of caffeine to include SunDrop cola and other drinks, along with anxiety. She continues to take flecainide 100mg  BID. Today in office HR is 96 per EKG at rest.   I have placed a cardiac monitor on her to assess her HR for 7 days, as she states her HR does not always go up every day. I will not add medications at this time. She is advised to eliminate caffeine from her diet entirely, as she may be very sensitive to this. Follow up appt after monitor returned.    Chest pain   Last Assessment & Plan   03/18/2014 Office Visit Written 03/18/2014 10:12 PM by Marinus MawGregg W Taylor, MD    These appear to be due to her palpitations rather than any other problems.        Imaging: No results found.

## 2014-05-30 ENCOUNTER — Ambulatory Visit (INDEPENDENT_AMBULATORY_CARE_PROVIDER_SITE_OTHER): Payer: Self-pay | Admitting: Internal Medicine

## 2014-05-30 ENCOUNTER — Encounter: Payer: Self-pay | Admitting: Internal Medicine

## 2014-05-30 VITALS — BP 112/88 | HR 112 | Ht 63.0 in | Wt 130.0 lb

## 2014-05-30 DIAGNOSIS — R0789 Other chest pain: Secondary | ICD-10-CM

## 2014-05-30 DIAGNOSIS — I493 Ventricular premature depolarization: Secondary | ICD-10-CM

## 2014-05-30 MED ORDER — FLECAINIDE ACETATE 100 MG PO TABS: 100.0000 mg | ORAL_TABLET | Freq: Two times a day (BID) | ORAL | Status: DC

## 2014-05-30 MED ORDER — NEBIVOLOL HCL 5 MG PO TABS
5.0000 mg | ORAL_TABLET | Freq: Every day | ORAL | Status: DC
Start: 2014-05-30 — End: 2014-07-09

## 2014-05-30 NOTE — Assessment & Plan Note (Signed)
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 I have discussed the benign nature of this problem with the patient and have asked her to reduce her flecainide from 150 mg twice daily down to 100 mg twice daily. Also, I would like her to try a low dose of bystolic 2.5 mg daily. I will see her back in approximately 3 months.

## 2014-05-30 NOTE — Assessment & Plan Note (Signed)
This is a consequence of her palpitations and she has had a non-ischemic treadmill previously. I have asked the patient to start exercising.

## 2014-05-30 NOTE — Assessment & Plan Note (Signed)
There were none demonstrated on her cardiac monitor. Will continue the lower dose of flecainide.

## 2014-05-30 NOTE — Patient Instructions (Signed)
Your physician recommends that you schedule a follow-up appointment in: 3 months with Dr. Ladona Ridgelaylor  Your physician has recommended you make the following change in your medication:   DECREASE FLECAINIDE TO 100 MG TWICE DAILY - YOU MAY TAKE 1/2 -1 TABLET EXTRA AS NEEDED FOR PALPITATIONS  START BYSTOLIC 2.5 MG DAILY FOR 30 DAYS. THEN INCREASE TO 5 MG DAILY  Thank you for choosing [facility]!!

## 2014-05-30 NOTE — Progress Notes (Signed)
HPI Mrs. Decock returns today for followup. She is a pleasant 28 yo woman with NSVT/PVC's and oAndrey Campanilen multiple medications and has been reasonably well controlled with flecainide. She is intolerant to metoprolol, verapamil and cardizem. She has recenty lost her mother, watching her die [place]. The trauma has been very difficult for her. Her palpitations initially worsened got better but now have worsened some. She has resting tachycardia. She wore a cardiac monitor for 7 days. I had hoped she would have gotten a 48 hour holter. No chest pain or sob. No syncope. She has had some headaches. Her dose of flecainide was increased to 150 mg twice daily without any obvious improvement. Allergies  Allergen Reactions  . Cardizem Cd [Diltiazem Hcl Er Beads]     rash  . Hydrocodone-Acetaminophen Itching and Nausea And Vomiting  . Metoprolol     Headaches, dizzyness  . Morphine And Related     nausea  . Verapamil     Racing heart, dizzyness, headache,shaking all over her body      Current Outpatient Prescriptions  Medication Sig Dispense Refill  . ALPRAZolam (XANAX) 0.25 MG tablet Take 0.25 mg by mouth 3 (three) times daily. 1/2 dose am/afternoon whole dose at night    . flecainide (TAMBOCOR) 100 MG tablet Take 1.5 tablets (150 mg total) by mouth 2 (two) times daily. 270 tablet 3  . mirtazapine (REMERON) 15 MG tablet Take 15 mg by mouth at bedtime.    . sertraline (ZOLOFT) 100 MG tablet Take 100 mg by mouth daily.     No current facility-administered medications for this visit.     Past Medical History  Diagnosis Date  . MVP (mitral valve prolapse)   . Palpitations   . Hyperlipidemia   . Headache(784.0)   . Heart murmur   . Complication of anesthesia   . PONV (postoperative nausea and vomiting)   . Dysrhythmia     sinus arrythmia  . Family history of anesthesia complication     PONV    ROS:   All systems reviewed and negative except as noted in the  HPI.   Past Surgical History  Procedure Laterality Date  . Cervical cerclage  2008  . Myringotomy    . Cervical cerclage  09/08/2011    Procedure: CERCLAGE CERVICAL;  Surgeon: Lazaro ArmsLuther H Eure, MD;  Location: [facility];  Service: Gynecology;  Laterality: N/A;  [name],CST-scrubbed; doctor scrubbed in @ [phone]  . Supracervical abdominal hysterectomy  01/26/2012    Procedure: HYSTERECTOMY SUPRACERVICAL ABDOMINAL;  Surgeon: Lazaro ArmsLuther H Eure, MD;  Location: [facility];  Service: Gynecology;;  converted [phone]  . Laparoscopy  08/09/2012    Procedure: LAPAROSCOPY DIAGNOSTIC;  Surgeon: Lazaro ArmsLuther H Eure, MD;  Location: [facility];  Service: Gynecology;  Laterality: N/A;  started at  1208-Vaginal trachelectomy (vaginal removal of cervical stump)   . Laparoscopic lysis of adhesions  08/09/2012    Procedure: LAPAROSCOPIC LYSIS OF ADHESIONS;  Surgeon: Lazaro ArmsLuther H Eure, MD;  Location: [facility];  Service: Gynecology;  Laterality: N/A;  . Abdominal hysterectomy       Family History  Problem Relation Age of Onset  . Hypertension Brother   . Heart disease Brother     hole in heart  . Anesthesia problems Neg Hx   . Hypotension Neg Hx   . Pseudochol deficiency Neg Hx   . Malignant hyperthermia Neg Hx   . Other Neg Hx   . Ovarian cancer Mother  History   Social History  . Marital Status: Married    Spouse Name: N/A    Number of Children: 1  . Years of Education: N/A   Occupational History  . unemployed    Social History Main Topics  . Smoking status: Former Smoker -- 1 years    Types: Cigarettes    Quit date: 09/01/2009  . Smokeless tobacco: Never Used     Comment: smokes 1 pack a week  . Alcohol Use: No  . Drug Use: No  . Sexual Activity: Yes    Birth Control/ Protection: Surgical   Other Topics Concern  . Not on file   Social History Narrative     BP 112/88 mmHg  Pulse 112  Ht 5\' 3"  (1.6 m)  Wt 130 lb (58.968 kg)  BMI 23.03 kg/m2  SpO2 98%  LMP 05/04/2011  Physical Exam:  Well  appearing 28 yo woman, NAD HEENT: Unremarkable Neck:  No JVD, no thyromegally Back:  No CVA tenderness Lungs:  Clear with no wheezes HEART:  Regular rate rhythm, no murmurs, no rubs, no clicks Abd:  soft, positive bowel sounds, no organomegally, no rebound, no guarding Ext:  2 plus pulses, no edema, no cyanosis, no clubbing Skin:  No rashes no nodules Neuro:  CN II through XII intact, motor grossly intact  Assess/Plan:

## 2014-06-06 ENCOUNTER — Telehealth: Payer: Self-pay | Admitting: Internal Medicine

## 2014-06-06 NOTE — Telephone Encounter (Signed)
Will forward to Dr. Taylor.  

## 2014-06-06 NOTE — Telephone Encounter (Signed)
Patient states that she has started Bistolic and it is working. / tgs

## 2014-07-05 ENCOUNTER — Telehealth: Payer: Self-pay | Admitting: *Deleted

## 2014-07-05 NOTE — Telephone Encounter (Signed)
Pt wants to know if she can stop taking bystolic

## 2014-07-05 NOTE — Telephone Encounter (Signed)
Pt started Bystolic 3 weeks ago and states she has had diarrhea ever since,yet she reports 13 Lbs weight gain and states her "Fitbit" states her HR is 108-120  She wants to stop Bystolic,can you advise    Sent to Dr.Taylor

## 2014-07-08 NOTE — Telephone Encounter (Signed)
Ok to stop bystolic. GT

## 2014-07-09 NOTE — Addendum Note (Signed)
Addended by: Marlyn CorporalARLTON, CATHERINE A on: 07/09/2014 08:11 AM   Modules accepted: Orders, Medications

## 2014-07-09 NOTE — Telephone Encounter (Signed)
Lm on pt's private vm that she could stop Bystolic

## 2014-07-22 ENCOUNTER — Telehealth: Payer: Self-pay | Admitting: *Deleted

## 2014-07-22 ENCOUNTER — Other Ambulatory Visit: Payer: Self-pay | Admitting: Internal Medicine

## 2014-07-22 NOTE — Telephone Encounter (Signed)
Forwarded to DOD as Dr. Ladona Ridgel not in office.

## 2014-07-22 NOTE — Telephone Encounter (Signed)
PT states that she is retaining more fluid then normal

## 2014-07-22 NOTE — Telephone Encounter (Signed)
Patient no longer on Bystolic. No need for prior authorization.

## 2014-07-22 NOTE — Telephone Encounter (Signed)
Swelling that diffuse would not be heart related, I would be concerned about a possible allergic reaction if she has swelling that widespread. This would be better addressed by her pcp. Would not be concerned about heart rates in low 100s especially given fairly recent normal extensive heart testing, would continue to monitor for now. Please forward to Dr Ladona Ridgel as well.    Dominga Ferry MD

## 2014-07-22 NOTE — Telephone Encounter (Signed)
Spoke with pt,will wait to call pcp as she believes Bystolic may have been the cause    Will forward to Dr.Taylor

## 2014-07-22 NOTE — Telephone Encounter (Signed)
Hands, feet, arms, legs, stomach, face, are swollen and pt weight at 147lbs, and HR is at 102 and higher. Will forward to Dr. Ladona Ridgel.

## 2014-07-22 NOTE — Telephone Encounter (Signed)
Prior Authorization form / please see refill bin / tgs

## 2014-09-04 ENCOUNTER — Telehealth: Payer: Self-pay | Admitting: *Deleted

## 2014-09-04 NOTE — Telephone Encounter (Signed)
Patient called and states that she is currently having chest pain and SOB. Patient rates pain 5/10. She states the pain comes and goes. And when is comes that it only stays for 5 mins. Patient reports that the pain wakes her from her sleep. When asked how long she has been having this pain patient states  1 week. Patient was encouraged to go the ED. Patient states that every time she goes to the ED she is told there is nothing they more they can do and that she needs to be seen by her cardiologist. Patient has an Appt. [place]. [place] in [place] on Friday 09/06/14. Patient then states that she is always in SVT. When asked if she feels as if her heart is racing and if she had a way of checking her HR patient states her HR is 100. Patient states Dr. Ladona Ridgelaylor said it is ok for her to stop taking Bystolic due to it giving her diarrhea. Patient currently at home with husband and children. At the end of conversation patient denies chest pain and SOB. Patient encouraged to go to ED if chest pain returns.

## 2014-09-06 ENCOUNTER — Ambulatory Visit (INDEPENDENT_AMBULATORY_CARE_PROVIDER_SITE_OTHER): Payer: Medicaid Other | Admitting: Internal Medicine

## 2014-09-06 ENCOUNTER — Encounter: Payer: Self-pay | Admitting: Internal Medicine

## 2014-09-06 VITALS — BP 110/86 | HR 109 | Ht 63.0 in | Wt 158.0 lb

## 2014-09-06 DIAGNOSIS — R002 Palpitations: Secondary | ICD-10-CM

## 2014-09-06 DIAGNOSIS — I493 Ventricular premature depolarization: Secondary | ICD-10-CM

## 2014-09-06 DIAGNOSIS — R072 Precordial pain: Secondary | ICD-10-CM

## 2014-09-06 NOTE — Assessment & Plan Note (Signed)
Her symptoms remain but are tolerable on flecainide 100 mg twice daily. She will continue this medication. No other changes.

## 2014-09-06 NOTE — Patient Instructions (Addendum)
Your physician recommends that you schedule a follow-up appointment in: 3 months with Dr. Ladona Ridgelaylor  Your physician has requested that you have cardiac CT. Cardiac computed tomography (CT) is a painless test that uses an x-ray machine to take clear, detailed pictures of your heart. For further information please visit [email or web address]. Please follow instruction sheet as given.   Your physician recommends that you continue on your current medications as directed. Please refer to the Current Medication list given to you today.  Thank you for choosing [facility]!

## 2014-09-06 NOTE — Progress Notes (Signed)
HPI Tara Mahoney returns today for followup. 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 She is intolerant to metoprolol, verapamil and cardizem.  Her palpitations initially worsened but got better but now have worsened some. She has some resting tachycardia. No syncope.  The patient's main complaint today is chest pain. She is convinced she has heart problems. She notes associated sob. She notes that she is awakened from sleep with these symptoms. She does not think her heart is racing during these episodes. She denies dietary indiscretion but has gained approximately 30 lbs in the past year.  Allergies  Allergen Reactions  . Cardizem Cd [Diltiazem Hcl Er Beads]     rash  . Hydrocodone-Acetaminophen Itching and Nausea And Vomiting  . Metoprolol     Headaches, dizzyness  . Morphine And Related     nausea  . Verapamil     Racing heart, dizzyness, headache,shaking all over her body      Current Outpatient Prescriptions  Medication Sig Dispense Refill  . ALPRAZolam (XANAX) 0.25 MG tablet Take 0.25 mg by mouth 3 (three) times daily. 1/2 dose am/afternoon whole dose at night    . flecainide (TAMBOCOR) 100 MG tablet Take 1 tablet (100 mg total) by mouth 2 (two) times daily. 270 tablet 3  . mirtazapine (REMERON) 15 MG tablet Take 15 mg by mouth at bedtime.    . sertraline (ZOLOFT) 100 MG tablet Take 100 mg by mouth daily.     No current facility-administered medications for this visit.     Past Medical History  Diagnosis Date  . MVP (mitral valve prolapse)   . Palpitations   . Hyperlipidemia   . Headache(784.0)   . Heart murmur   . Complication of anesthesia   . PONV (postoperative nausea and vomiting)   . Dysrhythmia     sinus arrythmia  . Family history of anesthesia complication     PONV    ROS:   All systems reviewed and negative except as noted in the HPI.   Past Surgical  History  Procedure Laterality Date  . Cervical cerclage  2008  . Myringotomy    . Cervical cerclage  09/08/2011    Procedure: CERCLAGE CERVICAL;  Surgeon: Lazaro Arms, MD;  Location: [facility];  Service: Gynecology;  Laterality: N/A;  [name],CST-scrubbed; doctor scrubbed in @ [phone]  . Supracervical abdominal hysterectomy  01/26/2012    Procedure: HYSTERECTOMY SUPRACERVICAL ABDOMINAL;  Surgeon: Lazaro Arms, MD;  Location: [facility];  Service: Gynecology;;  converted [phone]  . Laparoscopy  08/09/2012    Procedure: LAPAROSCOPY DIAGNOSTIC;  Surgeon: Lazaro Arms, MD;  Location: [facility];  Service: Gynecology;  Laterality: N/A;  started at  1208-Vaginal trachelectomy (vaginal removal of cervical stump)   . Laparoscopic lysis of adhesions  08/09/2012    Procedure: LAPAROSCOPIC LYSIS OF ADHESIONS;  Surgeon: Lazaro Arms, MD;  Location: [facility];  Service: Gynecology;  Laterality: N/A;  . Abdominal hysterectomy       Family History  Problem Relation Age of Onset  . Hypertension Brother   . Heart disease Brother     hole in heart  . Anesthesia problems Neg Hx   . Hypotension Neg Hx   . Pseudochol deficiency Neg Hx   . Malignant hyperthermia Neg Hx   . Other Neg Hx   . Ovarian cancer Mother      History  Social History  . Marital Status: Married    Spouse Name: N/A  . Number of Children: 1  . Years of Education: N/A   Occupational History  . unemployed    Social History Main Topics  . Smoking status: Former Smoker -- 1 years    Types: Cigarettes    Start date: 10/01/2001    Quit date: 09/01/2009  . Smokeless tobacco: Never Used     Comment: smokes 1 pack a week  . Alcohol Use: No  . Drug Use: No  . Sexual Activity:    Partners: Male    Birth Control/ Protection: Surgical   Other Topics Concern  . Not on file   Social History Narrative     BP 110/86 mmHg  Pulse 109  Ht 5\' 3"  (1.6 m)  Wt 158 lb (71.668 kg)  BMI 28.00 kg/m2  SpO2 97%  LMP 05/04/2011  Physical  Exam:  Well appearing 29 yo woman, NAD HEENT: Unremarkable Neck:  No JVD, no thyromegally Back:  No CVA tenderness Lungs:  Clear with no wheezes HEART:  Regular rate rhythm, no murmurs, no rubs, no clicks Abd:  soft, positive bowel sounds, no organomegally, no rebound, no guarding Ext:  2 plus pulses, no edema, no cyanosis, no clubbing Skin:  No rashes no nodules Neuro:  CN II through XII intact, motor grossly intact  Assess/Plan:

## 2014-09-06 NOTE — Assessment & Plan Note (Signed)
Her symptoms have typical and atypical components for angina. She cannot exercise as she has trouble walking. Her mother died suddenly. She had an MI. I have recommended she undergo coronary CT scanning to see if she has evidence of atherosclerosis.

## 2014-09-06 NOTE — Assessment & Plan Note (Signed)
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 Will follow.

## 2014-09-11 ENCOUNTER — Encounter: Payer: Self-pay | Admitting: Internal Medicine

## 2014-09-20 ENCOUNTER — Ambulatory Visit (HOSPITAL_COMMUNITY)
Admission: RE | Admit: 2014-09-20 | Discharge: 2014-09-20 | Disposition: A | Discharge status: Home / Self Care | Payer: Medicaid Other | Source: Ambulatory Visit | Attending: Internal Medicine | Admitting: Internal Medicine

## 2014-09-20 ENCOUNTER — Encounter (HOSPITAL_COMMUNITY): Payer: Self-pay

## 2014-09-20 DIAGNOSIS — R079 Chest pain, unspecified: Secondary | ICD-10-CM

## 2014-09-20 DIAGNOSIS — I493 Ventricular premature depolarization: Secondary | ICD-10-CM | POA: Diagnosis not present

## 2014-09-20 MED ORDER — METOPROLOL TARTRATE 1 MG/ML IV SOLN
5.0000 mg | INTRAVENOUS | Status: DC | PRN
  Administered 2014-09-20: 2.5 mg via INTRAVENOUS
  Administered 2014-09-20: 5 mg via INTRAVENOUS
  Filled 2014-09-20 (×2): qty 5

## 2014-09-20 MED ORDER — METOPROLOL TARTRATE 1 MG/ML IV SOLN
INTRAVENOUS | Status: AC
  Filled 2014-09-20: qty 10

## 2014-09-20 MED ORDER — SODIUM CHLORIDE 0.9 % IV SOLN
Freq: Once | INTRAVENOUS | Status: AC
  Administered 2014-09-20: 500 mL via INTRAVENOUS

## 2014-09-20 MED ORDER — NITROGLYCERIN 0.4 MG SL SUBL
SUBLINGUAL_TABLET | SUBLINGUAL | Status: AC
  Administered 2014-09-20: 0.4 mg
  Filled 2014-09-20: qty 1

## 2014-09-20 MED ORDER — IOHEXOL 350 MG/ML SOLN
80.0000 mL | Freq: Once | INTRAVENOUS | Status: AC | PRN
Start: 2014-09-20 — End: 2014-09-20
  Administered 2014-09-20: 80 mL via INTRAVENOUS

## 2014-12-06 ENCOUNTER — Ambulatory Visit: Payer: Medicaid Other | Admitting: Internal Medicine

## 2015-01-16 ENCOUNTER — Telehealth: Payer: Self-pay

## 2015-01-16 ENCOUNTER — Emergency Department (HOSPITAL_COMMUNITY)
Admission: EM | Admit: 2015-01-16 | Discharge: 2015-01-16 | Disposition: A | Discharge status: Home / Self Care | Payer: Medicaid Other | Attending: Emergency Medicine | Admitting: Emergency Medicine

## 2015-01-16 ENCOUNTER — Emergency Department (HOSPITAL_COMMUNITY): Payer: Medicaid Other

## 2015-01-16 ENCOUNTER — Encounter (HOSPITAL_COMMUNITY): Payer: Self-pay | Admitting: *Deleted

## 2015-01-16 DIAGNOSIS — R011 Cardiac murmur, unspecified: Secondary | ICD-10-CM | POA: Insufficient documentation

## 2015-01-16 DIAGNOSIS — R55 Syncope and collapse: Secondary | ICD-10-CM | POA: Insufficient documentation

## 2015-01-16 DIAGNOSIS — Z87891 Personal history of nicotine dependence: Secondary | ICD-10-CM | POA: Insufficient documentation

## 2015-01-16 DIAGNOSIS — E785 Hyperlipidemia, unspecified: Secondary | ICD-10-CM | POA: Diagnosis not present

## 2015-01-16 DIAGNOSIS — R51 Headache: Secondary | ICD-10-CM | POA: Diagnosis not present

## 2015-01-16 DIAGNOSIS — R0789 Other chest pain: Secondary | ICD-10-CM | POA: Diagnosis not present

## 2015-01-16 DIAGNOSIS — R42 Dizziness and giddiness: Secondary | ICD-10-CM | POA: Diagnosis not present

## 2015-01-16 DIAGNOSIS — R002 Palpitations: Secondary | ICD-10-CM | POA: Diagnosis not present

## 2015-01-16 DIAGNOSIS — Z79899 Other long term (current) drug therapy: Secondary | ICD-10-CM | POA: Insufficient documentation

## 2015-01-16 DIAGNOSIS — R0602 Shortness of breath: Secondary | ICD-10-CM | POA: Insufficient documentation

## 2015-01-16 DIAGNOSIS — Z8679 Personal history of other diseases of the circulatory system: Secondary | ICD-10-CM | POA: Diagnosis not present

## 2015-01-16 DIAGNOSIS — R000 Tachycardia, unspecified: Secondary | ICD-10-CM | POA: Diagnosis present

## 2015-01-16 DIAGNOSIS — Z3202 Encounter for pregnancy test, result negative: Secondary | ICD-10-CM | POA: Diagnosis not present

## 2015-01-16 HISTORY — DX: Encounter for other specified aftercare: Z51.89

## 2015-01-16 LAB — CBC WITH DIFFERENTIAL/PLATELET
Basophils Absolute: 0 10*3/uL (ref 0.0–0.1)
Basophils Relative: 0 % (ref 0–1)
EOS PCT: 6 % — AB (ref 0–5)
Eosinophils Absolute: 0.3 10*3/uL (ref 0.0–0.7)
HCT: 36.9 % (ref 36.0–46.0)
Hemoglobin: 12.7 g/dL (ref 12.0–15.0)
Lymphocytes Relative: 32 % (ref 12–46)
Lymphs Abs: 1.6 10*3/uL (ref 0.7–4.0)
MCH: 31 pg (ref 26.0–34.0)
MCHC: 34.4 g/dL (ref 30.0–36.0)
MCV: 90 fL (ref 78.0–100.0)
Monocytes Absolute: 0.4 10*3/uL (ref 0.1–1.0)
Monocytes Relative: 8 % (ref 3–12)
NEUTROS ABS: 2.8 10*3/uL (ref 1.7–7.7)
Neutrophils Relative %: 54 % (ref 43–77)
Platelets: 112 10*3/uL — ABNORMAL LOW (ref 150–400)
RBC: 4.1 MIL/uL (ref 3.87–5.11)
RDW: 12.5 % (ref 11.5–15.5)
WBC: 5.1 10*3/uL (ref 4.0–10.5)

## 2015-01-16 LAB — PREGNANCY, URINE: PREG TEST UR: NEGATIVE

## 2015-01-16 LAB — COMPREHENSIVE METABOLIC PANEL
ALT: 13 U/L — ABNORMAL LOW (ref 14–54)
ANION GAP: 5 (ref 5–15)
AST: 16 U/L (ref 15–41)
Albumin: 3.8 g/dL (ref 3.5–5.0)
Alkaline Phosphatase: 36 U/L — ABNORMAL LOW (ref 38–126)
BUN: 13 mg/dL (ref 6–20)
CO2: 26 mmol/L (ref 22–32)
Calcium: 8.5 mg/dL — ABNORMAL LOW (ref 8.9–10.3)
Chloride: 106 mmol/L (ref 101–111)
Creatinine, Ser: 0.81 mg/dL (ref 0.44–1.00)
GFR calc Af Amer: >60 mL/min (ref >60)
GFR calc non Af Amer: >60 mL/min (ref >60)
GLUCOSE: 83 mg/dL (ref 65–99)
Potassium: 4.2 mmol/L (ref 3.5–5.1)
Sodium: 137 mmol/L (ref 135–145)
Total Bilirubin: 0.4 mg/dL (ref 0.3–1.2)
Total Protein: 6.3 g/dL — ABNORMAL LOW (ref 6.5–8.1)

## 2015-01-16 LAB — URINALYSIS, ROUTINE W REFLEX MICROSCOPIC
BILIRUBIN URINE: NEGATIVE
Glucose, UA: NEGATIVE mg/dL
Hgb urine dipstick: NEGATIVE
KETONES UR: NEGATIVE mg/dL
Leukocytes, UA: NEGATIVE
Nitrite: NEGATIVE
Protein, ur: NEGATIVE mg/dL
Urobilinogen, UA: 0.2 mg/dL (ref 0.0–1.0)
pH: 5 (ref 5.0–8.0)

## 2015-01-16 LAB — TROPONIN I: Troponin I: <0.03 ng/mL (ref <0.031)

## 2015-01-16 LAB — MAGNESIUM: Magnesium: 1.9 mg/dL (ref 1.7–2.4)

## 2015-01-16 MED ORDER — SODIUM CHLORIDE 0.9 % IV BOLUS (SEPSIS)
1000.0000 mL | Freq: Once | INTRAVENOUS | Status: AC
  Administered 2015-01-16: 1000 mL via INTRAVENOUS

## 2015-01-16 MED ORDER — ATENOLOL 25 MG PO TABS: 12.5000 mg | ORAL_TABLET | Freq: Every day | ORAL | Status: DC

## 2015-01-16 MED ORDER — ACETAMINOPHEN 500 MG PO TABS
1000.0000 mg | ORAL_TABLET | Freq: Once | ORAL | Status: AC
  Administered 2015-01-16: 1000 mg via ORAL
  Filled 2015-01-16: qty 2

## 2015-01-16 NOTE — ED Notes (Signed)
Pt was mowing and had tachycardia, Hx of  V tach.  Told to come here by cardiologist.

## 2015-01-16 NOTE — ED Notes (Signed)
Appt with Dr. Wyline MoodBranch on July 5th at 8:40 am in Cardiology dept at [facility].

## 2015-01-16 NOTE — Telephone Encounter (Signed)
I received a telephone call from Dr. Manus Gunningancour in the ER regarding patient that was sent for evaluation per call to nursing earlier today. She was outside in her garden today when she began to experience sudden onset palpitations and lightheadedness as well as a feeling of chest discomfort and shortness of breath. Her Fitbit recorded a heart rate in the 180s. She did not have any syncope with this. Symptoms resolved after about 20 minutes. On evaluation in the ER she was in sinus rhythm, and symptoms had completely resolved other than having a mild headache. ECG showed normal sinus rhythm with normal QRS. I reviewed her chart. She is followed by Dr. Wyline MoodBranch and Dr. 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 There is also mention of atrial tachycardia as well. She has been maintained on flecainide with multiple other medication intolerances including metoprolol, verapamil, and Cardizem CD. Lab work is pending. If no acute abnormalities are noted, it is anticipated the patient will be discharged home. A follow-up visit has been scheduled with Dr. Wyline MoodBranch for July 5. I also recommended that Dr. Manus Gunningancour try her on atenolol 12.5 mg daily to see if perhaps she might tolerate this in addition to her flecainide.  Jonelle SidleSamuel G. Stephaniemarie Stoffel, M.D., F.A.C.C.

## 2015-01-16 NOTE — Discharge Instructions (Signed)
Palpitations Follow up with cardiology on July 5.  Return to the ED if you develop chest pain, Shortness of breath, recurrent palpations, passing out, or any other concerns. A palpitation is the feeling that your heartbeat is irregular or is faster than normal. It may feel like your heart is fluttering or skipping a beat. Palpitations are usually not a serious problem. However, in some cases, you may need further medical evaluation. CAUSES  Palpitations can be caused by:  Smoking.  Caffeine or other stimulants, such as diet pills or energy drinks.  Alcohol.  Stress and anxiety.  Strenuous physical activity.  Fatigue.  Certain medicines.  Heart disease, especially if you have a history of irregular heart rhythms (arrhythmias), such as atrial fibrillation, atrial flutter, or supraventricular tachycardia.  An improperly working pacemaker or defibrillator. DIAGNOSIS  To find the cause of your palpitations, your health care provider will take your medical history and perform a physical exam. Your health care provider may also have you take a test called an ambulatory electrocardiogram (ECG). An ECG records your heartbeat patterns over a 24-hour period. You may also have other tests, such as:  Transthoracic echocardiogram (TTE). During echocardiography, sound waves are used to evaluate how blood flows through your heart.  Transesophageal echocardiogram (TEE).  Cardiac monitoring. This allows your health care provider to monitor your heart rate and rhythm in real time.  Holter monitor. This is a portable device that records your heartbeat and can help diagnose heart arrhythmias. It allows your health care provider to track your heart activity for several days, if needed.  Stress tests by exercise or by giving medicine that makes the heart beat faster. TREATMENT  Treatment of palpitations depends on the cause of your symptoms and can vary greatly. Most cases of palpitations do not require  any treatment other than time, relaxation, and monitoring your symptoms. Other causes, such as atrial fibrillation, atrial flutter, or supraventricular tachycardia, usually require further treatment. HOME CARE INSTRUCTIONS   Avoid:  Caffeinated coffee, tea, soft drinks, diet pills, and energy drinks.  Chocolate.  Alcohol.  Stop smoking if you smoke.  Reduce your stress and anxiety. Things that can help you relax include:  A method of controlling things in your body, such as your heartbeats, with your mind (biofeedback).  Yoga.  Meditation.  Physical activity such as swimming, jogging, or walking.  Get plenty of rest and sleep. SEEK MEDICAL CARE IF:   You continue to have a fast or irregular heartbeat beyond 24 hours.  Your palpitations occur more often. SEEK IMMEDIATE MEDICAL CARE IF:  You have chest pain or shortness of breath.  You have a severe headache.  You feel dizzy or you faint. MAKE SURE YOU:  Understand these instructions.  Will watch your condition.  Will get help right away if you are not doing well or get worse. Document Released: 07/02/2000 Document Revised: 07/10/2013 Document Reviewed: 09/03/2011 [facility] [employer] Patient Information 2015 [employer], [place]. This information is not intended to replace advice given to you by your health care provider. Make sure you discuss any questions you have with your health care provider.

## 2015-01-16 NOTE — ED Provider Notes (Signed)
CSN: [id number]     Arrival date & time 01/16/15  1325 History   First MD Initiated Contact with Patient 01/16/15 1341     Chief Complaint  Patient presents with  . Tachycardia     (Consider location/radiation/quality/duration/timing/severity/associated sxs/prior Treatment) HPI Comments: Patient presents with episode of racing heart, palpitations, chest pain or shortness of breath now resolved. Was working outside in her garden for about 30 minutes. She got up to start mowing the grass and had acute onset of palpitations with lightheadedness and chest tightness and shortness of breath. Her fitbit recorded a heart rate of 180. She sat down and drank some water. This resolved after about 15 or 20 minutes. She was told to come to the ED by her cardiologist. Patient has a history of paroxysmal nonsustained VT as well as atrial tachycardia. She is on flecainide for rate control. States compliance. She feels better now but has a headache that onset after the episode of palpitations. No focal weakness, numbness or tingling. No fever. She's had similar episodes in the past but never this severe. She denies any chest pain or shortness of breath now. Her EKG is sinus rhythm on arrival.  The history is provided by the patient.    Past Medical History  Diagnosis Date  . MVP (mitral valve prolapse)   . Palpitations   . Hyperlipidemia   . Headache(784.0)   . Heart murmur   . Complication of anesthesia   . PONV (postoperative nausea and vomiting)   . Dysrhythmia     sinus arrythmia  . Family history of anesthesia complication     PONV  . V tach   . Blood transfusion without reported diagnosis    Past Surgical History  Procedure Laterality Date  . Cervical cerclage  2008  . Myringotomy    . Cervical cerclage  09/08/2011    Procedure: CERCLAGE CERVICAL;  Surgeon: Lazaro Arms, MD;  Location: [facility];  Service: Gynecology;  Laterality: N/A;  [name],CST-scrubbed; doctor scrubbed in @ [phone]   . Supracervical abdominal hysterectomy  01/26/2012    Procedure: HYSTERECTOMY SUPRACERVICAL ABDOMINAL;  Surgeon: Lazaro Arms, MD;  Location: [facility];  Service: Gynecology;;  converted [phone]  . Laparoscopy  08/09/2012    Procedure: LAPAROSCOPY DIAGNOSTIC;  Surgeon: Lazaro Arms, MD;  Location: [facility];  Service: Gynecology;  Laterality: N/A;  started at  1208-Vaginal trachelectomy (vaginal removal of cervical stump)   . Laparoscopic lysis of adhesions  08/09/2012    Procedure: LAPAROSCOPIC LYSIS OF ADHESIONS;  Surgeon: Lazaro Arms, MD;  Location: [facility];  Service: Gynecology;  Laterality: N/A;  . Abdominal hysterectomy     Family History  Problem Relation Age of Onset  . Hypertension Brother   . Heart disease Brother     hole in heart  . Anesthesia problems Neg Hx   . Hypotension Neg Hx   . Pseudochol deficiency Neg Hx   . Malignant hyperthermia Neg Hx   . Other Neg Hx   . Ovarian cancer Mother    History  Substance Use Topics  . Smoking status: Former Smoker -- 1 years    Types: Cigarettes    Start date: 10/01/2001    Quit date: 09/01/2009  . Smokeless tobacco: Never Used     Comment: smokes 1 pack a week  . Alcohol Use: No   OB History    Gravida Para Term Preterm AB TAB SAB Ectopic Multiple Living   1  2     Review of Systems  Constitutional: Negative for activity change, appetite change and fatigue.  HENT: Negative for congestion.   Respiratory: Positive for chest tightness and shortness of breath.   Cardiovascular: Positive for palpitations. Negative for chest pain.  Gastrointestinal: Negative for nausea, vomiting and abdominal pain.  Genitourinary: Negative for dysuria, hematuria, vaginal bleeding and vaginal discharge.  Musculoskeletal: Negative for myalgias and arthralgias.  Skin: Negative for rash.  Neurological: Positive for dizziness, light-headedness and headaches. Negative for weakness.  A complete 10 system review of systems was obtained and all  systems are negative except as noted in the HPI and PMH.      Allergies  Cardizem cd; Hydrocodone-acetaminophen; Metoprolol; Morphine and related; and Verapamil  Home Medications   Prior to Admission medications   Medication Sig Start Date End Date Taking? Authorizing Provider  ALPRAZolam (XANAX) 0.25 MG tablet Take 0.125-0.25 mg by mouth 2 (two) times daily.    Yes Historical Provider, MD  flecainide (TAMBOCOR) 100 MG tablet Take 1 tablet (100 mg total) by mouth 2 (two) times daily. 05/30/14  Yes Marinus Maw, MD  mirtazapine (REMERON) 15 MG tablet Take 15 mg by mouth at bedtime.   Yes Historical Provider, MD  rosuvastatin (CRESTOR) 5 MG tablet Take 5 mg by mouth daily.   Yes Historical Provider, MD  sertraline (ZOLOFT) 100 MG tablet Take 100 mg by mouth daily.   Yes Historical Provider, MD  atenolol (TENORMIN) 25 MG tablet Take 0.5 tablets (12.5 mg total) by mouth daily. 01/16/15   Glynn Octave, MD   BP 102/74 mmHg  Pulse 69  Temp(Src) 98.3 F (36.8 C) (Oral)  Resp 19  Ht 5\' 3"  (1.6 m)  Wt 140 lb (63.504 kg)  BMI 24.81 kg/m2  SpO2 99%  LMP 05/04/2011 Physical Exam  Constitutional: She is oriented to person, place, and time. She appears well-developed and well-nourished. No distress.  HENT:  Head: Normocephalic and atraumatic.  Mouth/Throat: Oropharynx is clear and moist. No oropharyngeal exudate.  Eyes: Conjunctivae and EOM are normal. Pupils are equal, round, and reactive to light.  Neck: Normal range of motion. Neck supple.  No meningismus.  Cardiovascular: Normal rate, regular rhythm, normal heart sounds and intact distal pulses.   No murmur heard. Pulmonary/Chest: Effort normal and breath sounds normal. No respiratory distress.  Abdominal: Soft. There is no tenderness. There is no rebound and no guarding.  Musculoskeletal: Normal range of motion. She exhibits no edema or tenderness.  Neurological: She is alert and oriented to person, place, and time. No cranial  nerve deficit. She exhibits normal muscle tone. Coordination normal.  No ataxia on finger to nose bilaterally. No pronator drift. 5/5 strength throughout. CN 2-12 intact. Equal grip strength. Sensation intact. .   Skin: Skin is warm.  Psychiatric: She has a normal mood and affect. Her behavior is normal.  Nursing note and vitals reviewed.   ED Course  Procedures (including critical care time) Labs Review Labs Reviewed  URINALYSIS, ROUTINE W REFLEX MICROSCOPIC (NOT AT [facility]) - Abnormal; Notable for the following:    Specific Gravity, Urine >1.030 (*)    All other components within normal limits  CBC WITH DIFFERENTIAL/PLATELET - Abnormal; Notable for the following:    Platelets 112 (*)    Eosinophils Relative 6 (*)    All other components within normal limits  COMPREHENSIVE METABOLIC PANEL - Abnormal; Notable for the following:    Calcium 8.5 (*)    Total Protein 6.3 (*)  ALT 13 (*)    Alkaline Phosphatase 36 (*)    All other components within normal limits  PREGNANCY, URINE  TROPONIN I  MAGNESIUM  TROPONIN I  CBC WITH DIFFERENTIAL/PLATELET    Imaging Review Dg Chest 2 View  01/16/2015   CLINICAL DATA:  Generalized chest pain, tachycardia, headache  EXAM: CHEST  2 VIEW  COMPARISON:  None.  FINDINGS: The heart size and mediastinal contours are within normal limits. Both lungs are clear. The visualized skeletal structures are unremarkable.  IMPRESSION: No active cardiopulmonary disease.   Electronically Signed   By: Natasha MeadLiviu  Pop M.D.   On: 01/16/2015 14:45   Ct Head Wo Contrast  01/16/2015   CLINICAL DATA:  Headache.  Tachycardia.  EXAM: CT HEAD WITHOUT CONTRAST  TECHNIQUE: Contiguous axial images were obtained from the base of the skull through the vertex without intravenous contrast.  COMPARISON:  None.  FINDINGS: The ventricles are normal in size and configuration. There are no parenchymal masses or mass effect, no areas of abnormal parenchymal attenuation no evidence of an infarct.  No extra-axial masses or abnormal fluid collections.  No intracranial hemorrhage.  Visualized sinuses and mastoid air cells are clear.  IMPRESSION: Normal unenhanced CT scan the brain.   Electronically Signed   By: Amie Portlandavid  Ormond M.D.   On: 01/16/2015 14:20     EKG Interpretation   Date/Time:  Thursday January 16 2015 13:34:06 EDT Ventricular Rate:  84 PR Interval:  164 QRS Duration: 89 QT Interval:  378 QTC Calculation: 447 R Axis:   75 Text Interpretation:  Sinus rhythm No significant change was found  Confirmed by Manus GunningANCOUR  MD, Eunice Winecoff [phone]([id number]) on 01/16/2015 2:07:55 PM      MDM   Final diagnoses:  Palpitations  Near syncope   Episode of near syncope, palpitations, chest pain or shortness of breath now resolved. History of paroxysmal V. tach. EKG sinus rhythm on arrival.  Patient also reports sudden onset headache that happened after this near syncopal episode. Nonfocal neurological exam. Negative head CT within one hour of headache onset effectively rules out subarachnoid hemorrhage.  Discussed with Dr. Diona BrownerMcDowell cardiology. Impossible to tell whether this episode was ventricular tachycardia versus SVT versus other arrhythmia. She is sinus rhythm now. He states patient may try a small dose atenolol though she did not tolerate metoprolol in the past. He'll arrange for follow-up and does not think any further testing necessary this time.  Appointment made with Dr. Wyline MoodBranch on July 5th at 8:40 am.  Electrolytes within normal limits. Chest x-ray negative. CT head negative. Headache has resolved. We'll add low-dose atenolol as recommended by Dr. Diona BrownerMcDowell. She has follow-up on July 5. Return precautions discussed.  Glynn OctaveStephen Lundynn Cohoon, MD 01/16/15 2134

## 2015-01-16 NOTE — Telephone Encounter (Signed)
Pt was outside with push mower for 30 minutes + and felt HA,flet ill and noted HR on fitbit was 185.Now at 115.i recommended she go to ED for evaluation as I do not have a provider available today.Pt agreed with plan

## 2015-01-21 ENCOUNTER — Ambulatory Visit (INDEPENDENT_AMBULATORY_CARE_PROVIDER_SITE_OTHER): Payer: Medicaid Other | Admitting: Cardiology

## 2015-01-21 ENCOUNTER — Encounter: Payer: Self-pay | Admitting: Cardiology

## 2015-01-21 VITALS — BP 110/78 | HR 74 | Ht 63.0 in | Wt 140.0 lb

## 2015-01-21 DIAGNOSIS — R002 Palpitations: Secondary | ICD-10-CM | POA: Diagnosis not present

## 2015-01-21 DIAGNOSIS — R0789 Other chest pain: Secondary | ICD-10-CM

## 2015-01-21 MED ORDER — ATENOLOL 25 MG PO TABS: ORAL_TABLET | ORAL | Status: DC

## 2015-01-21 NOTE — Progress Notes (Signed)
Clinical Summary [name] is a 29 y.o.female seen today for follow up of the following medical problems.   1. 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 - has been on flecanide most recently with improved but not resolved symptoms - seen in ER 01/16/15 with palpitations, negative workup - started on low dose atenolol from ER visit. Sine being on atenolol reports intermittent symptomatic low heart rates.   2. Chest pain - coronary CT with overall normal coronaries - stress echo 01/2014 [facility] treadmill score of 10, no ischemia by EKG or imaging  - no recent chest pain symptoms   Past Medical History  Diagnosis Date  . MVP (mitral valve prolapse)   . Palpitations   . Hyperlipidemia   . Headache(784.0)   . Heart murmur   . Complication of anesthesia   . PONV (postoperative nausea and vomiting)   . Dysrhythmia     sinus arrythmia  . Family history of anesthesia complication     PONV  . V tach   . Blood transfusion without reported diagnosis      Allergies  Allergen Reactions  . Cardizem Cd [Diltiazem Hcl Er Beads]     rash  . Hydrocodone-Acetaminophen Itching and Nausea And Vomiting  . Metoprolol     Headaches, dizzyness  . Morphine And Related     nausea  . Verapamil     Racing heart, dizzyness, headache,shaking all over her body      Current Outpatient Prescriptions  Medication Sig Dispense Refill  . ALPRAZolam (XANAX) 0.25 MG tablet Take 0.125-0.25 mg by mouth 2 (two) times daily.     Marland Kitchen. atenolol (TENORMIN) 25 MG tablet Take 0.5 tablets (12.5 mg total) by mouth daily. 30 tablet 0  . flecainide (TAMBOCOR) 100 MG tablet Take 1 tablet (100 mg total) by mouth 2 (two) times daily. 270 tablet 3  . mirtazapine (REMERON) 15 MG tablet Take 15 mg by mouth at bedtime.    . rosuvastatin (CRESTOR) 5 MG tablet Take 5 mg by mouth daily.    . sertraline (ZOLOFT) 100 MG tablet Take 100 mg by mouth daily.     No current facility-administered medications for  this visit.     Past Surgical History  Procedure Laterality Date  . Cervical cerclage  2008  . Myringotomy    . Cervical cerclage  09/08/2011    Procedure: CERCLAGE CERVICAL;  Surgeon: Tara ArmsLuther H Eure, MD;  Location: [facility];  Service: Gynecology;  Laterality: N/A;  Tara Mahoney,CST-scrubbed; doctor scrubbed in @ [phone]  . Supracervical abdominal hysterectomy  01/26/2012    Procedure: HYSTERECTOMY SUPRACERVICAL ABDOMINAL;  Surgeon: Tara ArmsLuther H Eure, MD;  Location: [facility];  Service: Gynecology;;  converted [phone]  . Laparoscopy  08/09/2012    Procedure: LAPAROSCOPY DIAGNOSTIC;  Surgeon: Tara ArmsLuther H Eure, MD;  Location: [facility];  Service: Gynecology;  Laterality: N/A;  started at  1208-Vaginal trachelectomy (vaginal removal of cervical stump)   . Laparoscopic lysis of adhesions  08/09/2012    Procedure: LAPAROSCOPIC LYSIS OF ADHESIONS;  Surgeon: Tara ArmsLuther H Eure, MD;  Location: [facility];  Service: Gynecology;  Laterality: N/A;  . Abdominal hysterectomy       Allergies  Allergen Reactions  . Cardizem Cd [Diltiazem Hcl Er Beads]     rash  . Hydrocodone-Acetaminophen Itching and Nausea And Vomiting  . Metoprolol     Headaches, dizzyness  . Morphine And Related     nausea  . Verapamil  Racing heart, dizzyness, headache,shaking all over her body       Family History  Problem Relation Age of Onset  . Hypertension Brother   . Heart disease Brother     hole in heart  . Anesthesia problems Neg Hx   . Hypotension Neg Hx   . Pseudochol deficiency Neg Hx   . Malignant hyperthermia Neg Hx   . Other Neg Hx   . Ovarian cancer Mother      Social History Tara Mahoney reports that she quit smoking about 5 years ago. Her smoking use included Cigarettes. She started smoking about 13 years ago. She quit after 1 year of use. She has never used smokeless tobacco. Tara Mahoney reports that she does not drink alcohol.   Review of Systems CONSTITUTIONAL: No weight loss, fever, chills, weakness or fatigue.    HEENT: Eyes: No visual loss, blurred vision, double vision or yellow sclerae.No hearing loss, sneezing, congestion, runny nose or sore throat.  SKIN: No rash or itching.  CARDIOVASCULAR: per HPI RESPIRATORY: No shortness of breath, cough or sputum.  GASTROINTESTINAL: No anorexia, nausea, vomiting or diarrhea. No abdominal pain or blood.  GENITOURINARY: No burning on urination, no polyuria NEUROLOGICAL: No headache, dizziness, syncope, paralysis, ataxia, numbness or tingling in the extremities. No change in bowel or bladder control.  MUSCULOSKELETAL: No muscle, back pain, joint pain or stiffness.  LYMPHATICS: No enlarged nodes. No history of splenectomy.  PSYCHIATRIC: No history of depression or anxiety.  ENDOCRINOLOGIC: No reports of sweating, cold or heat intolerance. No polyuria or polydipsia.  Marland Kitchen   Physical Examination Filed Vitals:   01/21/15 0839  BP: 110/78  Pulse: 74   Filed Vitals:   01/21/15 0839  Height:  (1.6 m)  Weight: 140 lb (63.504 kg)    Gen: resting comfortably, no acute distress HEENT: no scleral icterus, pupils equal round and reactive, no palptable cervical adenopathy,  CV: RRR, no m/r/g, no JVD Resp: Clear to auscultation bilaterally GI: abdomen is soft, non-tender, non-distended, normal bowel sounds, no hepatosplenomegaly MSK: extremities are warm, no edema.  Skin: warm, no rash Neuro:  no focal deficits Psych: appropriate affect     Assessment and Plan  1. NSVT/PVCs - recent episode of palpitations, seen in ER with no acute findings - continue flecanide. Is not tolerating daily atenolol, will change to prn palpitations only    2. Chest pain - negative ischemic workup - continue CAD risk factor modification     Antoine Poche, M.D

## 2015-01-21 NOTE — Patient Instructions (Signed)
Your physician recommends that you schedule a follow-up appointment in: 2 MONTHS WITH DR. Ladona RidgelAYLOR   TAKE ATENOLOL 12.5 MG DAILY AS NEEDED FOR PALPATATIONS  Thanks for choosing [facility]!!!

## 2015-02-14 ENCOUNTER — Other Ambulatory Visit: Payer: Self-pay

## 2015-02-17 ENCOUNTER — Ambulatory Visit (INDEPENDENT_AMBULATORY_CARE_PROVIDER_SITE_OTHER): Payer: Medicaid Other | Admitting: Cardiology

## 2015-02-17 ENCOUNTER — Encounter: Payer: Self-pay | Admitting: Cardiology

## 2015-02-17 VITALS — BP 116/64 | HR 73 | Ht 63.0 in | Wt 139.0 lb

## 2015-02-17 DIAGNOSIS — R002 Palpitations: Secondary | ICD-10-CM | POA: Diagnosis not present

## 2015-02-17 MED ORDER — ATENOLOL 25 MG PO TABS: 12.5000 mg | ORAL_TABLET | Freq: Two times a day (BID) | ORAL | Status: DC

## 2015-02-17 MED ORDER — MIDODRINE HCL 2.5 MG PO TABS: 2.5000 mg | ORAL_TABLET | Freq: Two times a day (BID) | ORAL | Status: DC

## 2015-02-17 NOTE — Patient Instructions (Signed)
Your physician recommends that you schedule a follow-up appointment in: 3 months with Dr Wyline Mood  Take Atenolol 12.5 mg twice a day  Take Midodrine 2.5 mg  twice a day      Thank you for choosing [facility] !

## 2015-02-17 NOTE — Progress Notes (Signed)
Patient ID: Tara Mahoney, female   DOB: 08-26-1985, 30 y.o.   MRN: [id number]     Clinical Summary Tara Mahoney is a 29 y.o.female seen today for follow up of the following medical problems   1. 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 - has been on flecanide most recently with improved but not resolved symptoms - seen in ER 01/16/15 with palpitations, negative workup. Since then has been taking prn low dose atenolol with some improvement in symptoms.   2. Chest pain - coronary CT with overall normal coronaries - stress echo 01/2014 Duke treadmill score of 10, no ischemia by EKG or imaging  - no recent chest pain symptoms   Past Medical History  Diagnosis Date  . MVP (mitral valve prolapse)   . Palpitations   . Hyperlipidemia   . Headache(784.0)   . Heart murmur   . Complication of anesthesia   . PONV (postoperative nausea and vomiting)   . Dysrhythmia     sinus arrythmia  . Family history of anesthesia complication     PONV  . V tach   . Blood transfusion without reported diagnosis      Allergies  Allergen Reactions  . Cardizem Cd [Diltiazem Hcl Er Beads]     rash  . Hydrocodone-Acetaminophen Itching and Nausea And Vomiting  . Metoprolol     Headaches, dizzyness  . Morphine And Related     nausea  . Verapamil     Racing heart, dizzyness, headache,shaking all over her body      Current Outpatient Prescriptions  Medication Sig Dispense Refill  . ALPRAZolam (XANAX) 0.25 MG tablet Take 0.125-0.25 mg by mouth 2 (two) times daily.     Marland Kitchen atenolol (TENORMIN) 25 MG tablet Take 12.5 mg (1/2 TABLET) daily AS NEEDED FOR PALPATATIONS 30 tablet 3  . flecainide (TAMBOCOR) 100 MG tablet Take 1 tablet (100 mg total) by mouth 2 (two) times daily. 270 tablet 3  . mirtazapine (REMERON) 15 MG tablet Take 15 mg by mouth at bedtime.    . rosuvastatin (CRESTOR) 5 MG tablet Take 5 mg by mouth daily.    . sertraline (ZOLOFT) 100 MG tablet Take 100 mg by mouth daily.      No current facility-administered medications for this visit.     Past Surgical History  Procedure Laterality Date  . Cervical cerclage  2008  . Myringotomy    . Cervical cerclage  09/08/2011    Procedure: CERCLAGE CERVICAL;  Surgeon: Lazaro Arms, MD;  Location: [facility];  Service: Gynecology;  Laterality: N/A;  [name],CST-scrubbed; doctor scrubbed in @ [phone]  . Supracervical abdominal hysterectomy  01/26/2012    Procedure: HYSTERECTOMY SUPRACERVICAL ABDOMINAL;  Surgeon: Lazaro Arms, MD;  Location: [facility];  Service: Gynecology;;  converted [phone]  . Laparoscopy  08/09/2012    Procedure: LAPAROSCOPY DIAGNOSTIC;  Surgeon: Lazaro Arms, MD;  Location: [facility];  Service: Gynecology;  Laterality: N/A;  started at  1208-Vaginal trachelectomy (vaginal removal of cervical stump)   . Laparoscopic lysis of adhesions  08/09/2012    Procedure: LAPAROSCOPIC LYSIS OF ADHESIONS;  Surgeon: Lazaro Arms, MD;  Location: [facility];  Service: Gynecology;  Laterality: N/A;  . Abdominal hysterectomy       Allergies  Allergen Reactions  . Cardizem Cd [Diltiazem Hcl Er Beads]     rash  . Hydrocodone-Acetaminophen Itching and Nausea And Vomiting  . Metoprolol     Headaches, dizzyness  . Morphine And  Related     nausea  . Verapamil     Racing heart, dizzyness, headache,shaking all over her body       Family History  Problem Relation Age of Onset  . Hypertension Brother   . Heart disease Brother     hole in heart  . Anesthesia problems Neg Hx   . Hypotension Neg Hx   . Pseudochol deficiency Neg Hx   . Malignant hyperthermia Neg Hx   . Other Neg Hx   . Ovarian cancer Mother      Social History Tara Mahoney reports that she quit smoking about 5 years ago. Her smoking use included Cigarettes. She started smoking about 13 years ago. She quit after 1 year of use. She has never used smokeless tobacco. Tara Mahoney reports that she does not drink alcohol.   Review of Systems CONSTITUTIONAL:  No weight loss, fever, chills, weakness or fatigue.  HEENT: Eyes: No visual loss, blurred vision, double vision or yellow sclerae.No hearing loss, sneezing, congestion, runny nose or sore throat.  SKIN: No rash or itching.  CARDIOVASCULAR: per HPI RESPIRATORY: No shortness of breath, cough or sputum.  GASTROINTESTINAL: No anorexia, nausea, vomiting or diarrhea. No abdominal pain or blood.  GENITOURINARY: No burning on urination, no polyuria NEUROLOGICAL: No headache, dizziness, syncope, paralysis, ataxia, numbness or tingling in the extremities. No change in bowel or bladder control.  MUSCULOSKELETAL: No muscle, back pain, joint pain or stiffness.  LYMPHATICS: No enlarged nodes. No history of splenectomy.  PSYCHIATRIC: No history of depression or anxiety.  ENDOCRINOLOGIC: No reports of sweating, cold or heat intolerance. No polyuria or polydipsia.  Marland Kitchen   Physical Examination Filed Vitals:   02/17/15 1553  BP: 116/64  Pulse: 73   Filed Vitals:   02/17/15 1553  Height: 5\' 3"  (1.6 m)  Weight: 139 lb (63.05 kg)    Gen: resting comfortably, no acute distress HEENT: no scleral icterus, pupils equal round and reactive, no palptable cervical adenopathy,  CV: RRR, no m/r/g, no JVD Resp: Clear to auscultation bilaterally GI: abdomen is soft, non-tender, non-distended, normal bowel sounds, no hepatosplenomegaly MSK: extremities are warm, no edema.  Skin: warm, no rash Neuro:  no focal deficits Psych: appropriate affect   Diagnostic Studies 01/2014 Stress echo Study Conclusions  - Stress ECG conclusions: The stress ECG was normal. Duke treadmill score of 10, consistent with low risk for major cardiac events. - Staged echo: Normal echo stress - From notes patient is on flecanide. There is no QRS widening with exercise.  Impressions:  - Normal study after maximal exercise.   03/2013 echo Study Conclusions  - Study data: Technically adequate study - Left ventricle: The  cavity size was normal. Wall thickness was normal. Systolic function was normal. The estimated ejection fraction was in the range of 55% to 60%. Wall motion was normal; there were no regional wall motion abnormalities. Left ventricular diastolic function parameters were normal. - Aortic valve: Valve area: 1.7cm^2(VTI). Valve area: 1.65cm^2 (Vmax). - Mitral valve: Prominent somewhat redundant anterior leaflet. Smaller more dimunitive posterior leaflet. Mild regurgitation. The MR vena contracta is 0.3 cm. The jet is eccentric and posteriorly directed. - Left atrium: The atrium was normal in size.  05/2013 GXT No ischemic changes, no QRS prolongation    04/2013 Holter monitor 6 beat run of NSVT that was symptomatic. Other symptoms correlated with NSR/  05/2013 Holter No significant arrhythmias   05/2014 7 day monitor -symptoms correlate with NSR and sinus tach.   Assessment  and Plan  1. Palpitations - initial monitor showed PVCs and NSVT, she has been on flecanide. Repeat monitors since that time have not showed any recurrent arrhythmias. Symptoms of dizziness and palpitaitons from repeat monitors all associated with signifincant sinus tachycardia. Her symptoms do seem to be triggered with changes in position, example laying to standing. Orthostatics in clinic show heart rate increase of 18 beats from lying to standing, bp remains stable. She endorses adequate oral hydration. I suspect her symptoms may be less arrhythmia driven but more orthostatic/autonomic/inappropriate sinus tachycardia related. We will try midodrine 2.5mg  bid and titrate as needed. Continue flecanide and atenolol for now, continue oral hydraiton  2. Chest pain - negative cardiac CT and stress testing recently, no recent symptoms - continue to follow.   F/u 3 months  Antoine Poche, M.D.

## 2015-03-10 ENCOUNTER — Telehealth: Payer: Self-pay | Admitting: Cardiology

## 2015-03-10 MED ORDER — MIDODRINE HCL 5 MG PO TABS: 5.0000 mg | ORAL_TABLET | Freq: Two times a day (BID) | ORAL | Status: DC

## 2015-03-10 NOTE — Telephone Encounter (Signed)
Will send to dr Wyline Mood

## 2015-03-10 NOTE — Telephone Encounter (Signed)
Pt states that she is taking half of Atenolol 12.5mg  and that it is really not helping

## 2015-03-10 NOTE — Telephone Encounter (Signed)
Pt felt midodrine not strong enough,increased per your order

## 2015-03-10 NOTE — Telephone Encounter (Signed)
Unclear exactly what symptoms she is having from note, is it palpitations? We had started midodrine last visit due to her significant heart rate increase with position change. Is she still taking that. If so I would try increasing to  bid.   Dominga Ferry MD

## 2015-03-20 ENCOUNTER — Encounter: Payer: Self-pay | Admitting: Internal Medicine

## 2015-03-20 ENCOUNTER — Ambulatory Visit (INDEPENDENT_AMBULATORY_CARE_PROVIDER_SITE_OTHER): Payer: Medicaid Other | Admitting: Internal Medicine

## 2015-03-20 VITALS — BP 100/64 | HR 72 | Ht 63.0 in | Wt 134.0 lb

## 2015-03-20 DIAGNOSIS — I493 Ventricular premature depolarization: Secondary | ICD-10-CM

## 2015-03-20 DIAGNOSIS — G909 Disorder of the autonomic nervous system, unspecified: Secondary | ICD-10-CM

## 2015-03-20 DIAGNOSIS — R0789 Other chest pain: Secondary | ICD-10-CM | POA: Diagnosis not present

## 2015-03-20 NOTE — Patient Instructions (Signed)
Your physician wants you to follow-up in: 6 months with Dr. Ladona Ridgel. You will receive a reminder letter in the mail two months in advance. If you don't receive a letter, please call our office to schedule the follow-up appointment.  Your physician recommends that you continue on your current medications as directed. Please refer to the Current Medication list given to you today.  3 Problems  Chest Pain (Not a blockage. Not dangerous)   Autonomic Dysfunction ( Causes Blood Pressure to be low-salt/midodrine)   Tachycardia (Keep taking Flecainide and Atenolol)   Thank you for choosing [facility]!

## 2015-03-20 NOTE — Assessment & Plan Note (Signed)
This appears to be quiet. She will undergo watchful waiting.

## 2015-03-20 NOTE — Assessment & Plan Note (Signed)
Her symptoms are well controlled with flecainide. She will continue her current meds.

## 2015-03-20 NOTE — Assessment & Plan Note (Signed)
Her pressure is ok today but has been low at times. She will continue midodrine. I instructed her to take extra midodrine if her low blood pressure persists. Also she is instructed to increase her salt intake.

## 2015-03-20 NOTE — Progress Notes (Signed)
HPI Tara Mahoney returns today for followup. 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 She is intolerant to metoprolol, verapamil and cardizem. She also has autonomic dysfunction and has had low blood pressures in the 70's. She has been treated with midodrine. She also had chest pain and workup including a CT of coronaries has been negative for obstructive disease. She has been stable except for occasional problems with low blood pressure. Allergies  Allergen Reactions  . Cardizem Cd [Diltiazem Hcl Er Beads]     rash  . Hydrocodone-Acetaminophen Itching and Nausea And Vomiting  . Metoprolol     Headaches, dizzyness  . Morphine And Related     nausea  . Verapamil     Racing heart, dizzyness, headache,shaking all over her body      Current Outpatient Prescriptions  Medication Sig Dispense Refill  . ALPRAZolam (XANAX) 0.25 MG tablet Take 0.125-0.25 mg by mouth 2 (two) times daily.     Marland Kitchen atenolol (TENORMIN) 25 MG tablet Take 0.5 tablets (12.5 mg total) by mouth 2 (two) times daily. 90 tablet 3  . flecainide (TAMBOCOR) 100 MG tablet Take 1 tablet (100 mg total) by mouth 2 (two) times daily. 270 tablet 3  . midodrine (PROAMATINE) 5 MG tablet Take 1 tablet (5 mg total) by mouth 2 (two) times daily with a meal. 180 tablet 3  . mirtazapine (REMERON) 15 MG tablet Take 15 mg by mouth at bedtime.    . rosuvastatin (CRESTOR) 5 MG tablet Take 5 mg by mouth daily.    . sertraline (ZOLOFT) 100 MG tablet Take 100 mg by mouth daily.     No current facility-administered medications for this visit.     Past Medical History  Diagnosis Date  . MVP (mitral valve prolapse)   . Palpitations   . Hyperlipidemia   . Headache(784.0)   . Heart murmur   . Complication of anesthesia   . PONV (postoperative nausea and vomiting)   . Dysrhythmia     sinus arrythmia  . Family history of anesthesia complication    PONV  . V tach   . Blood transfusion without reported diagnosis     ROS:   All systems reviewed and negative except as noted in the HPI.   Past Surgical History  Procedure Laterality Date  . Cervical cerclage  2008  . Myringotomy    . Cervical cerclage  09/08/2011    Procedure: CERCLAGE CERVICAL;  Surgeon: Lazaro Arms, MD;  Location: [facility];  Service: Gynecology;  Laterality: N/A;  Tara Mahoney,CST-scrubbed; doctor scrubbed in @ [phone]  . Supracervical abdominal hysterectomy  01/26/2012    Procedure: HYSTERECTOMY SUPRACERVICAL ABDOMINAL;  Surgeon: Lazaro Arms, MD;  Location: [facility];  Service: Gynecology;;  converted [phone]  . Laparoscopy  08/09/2012    Procedure: LAPAROSCOPY DIAGNOSTIC;  Surgeon: Lazaro Arms, MD;  Location: [facility];  Service: Gynecology;  Laterality: N/A;  started at  1208-Vaginal trachelectomy (vaginal removal of cervical stump)   . Laparoscopic lysis of adhesions  08/09/2012    Procedure: LAPAROSCOPIC LYSIS OF ADHESIONS;  Surgeon: Lazaro Arms, MD;  Location: [facility];  Service: Gynecology;  Laterality: N/A;  . Abdominal hysterectomy       Family History  Problem Relation Age of Onset  . Hypertension Brother   . Heart disease Brother     hole in heart  . Anesthesia problems Neg  Hx   . Hypotension Neg Hx   . Pseudochol deficiency Neg Hx   . Malignant hyperthermia Neg Hx   . Other Neg Hx   . Ovarian cancer Mother      Social History   Social History  . Marital Status: Married    Spouse Name: N/A  . Number of Children: 1  . Years of Education: N/A   Occupational History  . unemployed    Social History Main Topics  . Smoking status: Former Smoker -- 1 years    Types: Cigarettes    Start date: 10/01/2001    Quit date: 09/01/2009  . Smokeless tobacco: Never Used     Comment: smokes 1 pack a week  . Alcohol Use: No  . Drug Use: No  . Sexual Activity:    Partners: Male    Birth Control/ Protection: Surgical   Other Topics Concern  . Not on  file   Social History Narrative     BP 100/64 mmHg  Pulse 72  Ht  (1.6 m)  Wt 134 lb (60.782 kg)  BMI 23.74 kg/m2  SpO2 97%  LMP 05/04/2011  Physical Exam:  Well appearing 29 yo woman, NAD HEENT: Unremarkable Neck:  No JVD, no thyromegally Back:  No CVA tenderness Lungs:  Clear with no wheezes HEART:  Regular rate rhythm, no murmurs, no rubs, no clicks Abd:  soft, positive bowel sounds, no organomegally, no rebound, no guarding Ext:  2 plus pulses, no edema, no cyanosis, no clubbing Skin:  No rashes no nodules Neuro:  CN II through XII intact, motor grossly intact  ECG - nsr  Assess/Plan:

## 2015-06-13 ENCOUNTER — Other Ambulatory Visit: Payer: Self-pay | Admitting: Internal Medicine

## 2015-07-18 ENCOUNTER — Ambulatory Visit (INDEPENDENT_AMBULATORY_CARE_PROVIDER_SITE_OTHER): Payer: Medicaid Other | Admitting: Adult Health

## 2015-07-18 ENCOUNTER — Encounter: Payer: Self-pay | Admitting: Adult Health

## 2015-07-18 VITALS — BP 90/58 | HR 61 | Ht 63.0 in | Wt 130.0 lb

## 2015-07-18 DIAGNOSIS — I471 Supraventricular tachycardia: Secondary | ICD-10-CM

## 2015-07-18 DIAGNOSIS — G909 Disorder of the autonomic nervous system, unspecified: Secondary | ICD-10-CM | POA: Diagnosis not present

## 2015-07-18 MED ORDER — MIDODRINE HCL 10 MG PO TABS: 10.0000 mg | ORAL_TABLET | Freq: Three times a day (TID) | ORAL | Status: DC

## 2015-07-18 MED ORDER — FLECAINIDE ACETATE 150 MG PO TABS: 150.0000 mg | ORAL_TABLET | Freq: Two times a day (BID) | ORAL | Status: DC

## 2015-07-18 NOTE — Patient Instructions (Signed)
Medication Instructions:  STOP ATENOLOL INCREASE FLECAINIDE TO 150 MG TWO TIMES DAILY INCREASE MIDODRINE TO 10 MG TWO TIMES DAILY  Labwork: NONE  Testing/Procedures: NONE  Follow-Up: Your physician recommends that you schedule a follow-up appointment in: 2 WEEKS WITH KATHRYN LAWRENCE   Any Other Special Instructions Will Be Listed Below (If Applicable).     If you need a refill on your cardiac medications before your next appointment, please call your pharmacy.

## 2015-07-18 NOTE — Progress Notes (Signed)
Name: Tara Mahoney    DOB: 1986/01/24  Age: 29 y.o.  MR#: [id number]       PCP:  Isabella Stalling, MD      Insurance: Payor: MEDICAID [place] / Plan: MEDICAID [place] ACCESS / Product Type: *No Product type* /   CC:   No chief complaint on file.   VS Filed Vitals:   07/18/15 1419  BP: 90/58  Pulse: 61  Height:  (1.6 m)  Weight: 130 lb (58.968 kg)  SpO2: 96%    Weights Current Weight  07/18/15 130 lb (58.968 kg)  03/20/15 134 lb (60.782 kg)  02/17/15 139 lb (63.05 kg)    Blood Pressure  BP Readings from Last 3 Encounters:  07/18/15 90/58  03/20/15 100/64  02/17/15 116/64     Admit date:  (Not on file) Last encounter with RMR:  Visit date not found   Allergy Cardizem cd; Hydrocodone-acetaminophen; Metoprolol; Morphine and related; and Verapamil  Current Outpatient Prescriptions  Medication Sig Dispense Refill  . atenolol (TENORMIN) 25 MG tablet Take 0.5 tablets (12.5 mg total) by mouth 2 (two) times daily. 90 tablet 3  . flecainide (TAMBOCOR) 100 MG tablet Take 1 tablet (100 mg total) by mouth 2 (two) times daily. 180 tablet 1  . midodrine (PROAMATINE) 5 MG tablet Take 1 tablet (5 mg total) by mouth 2 (two) times daily with a meal. 180 tablet 3  . mirtazapine (REMERON) 15 MG tablet Take 15 mg by mouth at bedtime.    . rosuvastatin (CRESTOR) 5 MG tablet Take 5 mg by mouth daily.    . sertraline (ZOLOFT) 100 MG tablet Take 100 mg by mouth daily.     No current facility-administered medications for this visit.    Discontinued Meds:    Medications Discontinued During This Encounter  Medication Reason  . ALPRAZolam (XANAX) 0.25 MG tablet Error    Patient Active Problem List   Diagnosis Date Noted  . Autonomic dysfunction 03/20/2015  . Chest pain 02/27/2014  . Sinus tachycardia (HCC) 02/06/2014  . PVC (premature ventricular contraction) 05/09/2013  . PAC (premature atrial contraction) 05/09/2013  . Nonsustained ventricular tachycardia (HCC) 05/09/2013  .  Abdominal pain, left lower quadrant 02/20/2013  . 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 PALPITATIONS 09/23/2010    LABS    Component Value Date/Time   NA 137 01/16/2015 1437   NA 142 02/12/2014 1004   NA 137 05/27/2013 2118   K 4.2 01/16/2015 1437   K 3.8 02/12/2014 1004   K 3.7 05/27/2013 2118   CL 106 01/16/2015 1437   CL 107 02/12/2014 1004   CL 101 05/27/2013 2118   CO2 26 01/16/2015 1437   CO2 25 02/12/2014 1004   CO2 27 05/27/2013 2118   GLUCOSE 83 01/16/2015 1437   GLUCOSE 91 02/12/2014 1004   GLUCOSE 97 05/27/2013 2118   BUN 13 01/16/2015 1437   BUN 11 02/12/2014 1004   BUN 14 05/27/2013 2118   CREATININE 0.81 01/16/2015 1437   CREATININE 0.99 02/12/2014 1004   CREATININE 0.96 05/27/2013 2118   CREATININE 0.86 04/11/2013 1034   CALCIUM 8.5* 01/16/2015 1437   CALCIUM 9.0 02/12/2014 1004   CALCIUM 10.1 05/27/2013 2118   GFRNONAA >60 01/16/2015 1437   GFRNONAA 76* 02/12/2014 1004   GFRNONAA 79* 05/27/2013 2118   GFRAA >60 01/16/2015 1437   GFRAA 88* 02/12/2014 1004   GFRAA >90 05/27/2013 2118   CMP     Component Value Date/Time   NA 137 01/16/2015 1437  K 4.2 01/16/2015 1437   CL 106 01/16/2015 1437   CO2 26 01/16/2015 1437   GLUCOSE 83 01/16/2015 1437   BUN 13 01/16/2015 1437   CREATININE 0.81 01/16/2015 1437   CREATININE 0.86 04/11/2013 1034   CALCIUM 8.5* 01/16/2015 1437   PROT 6.3* 01/16/2015 1437   ALBUMIN 3.8 01/16/2015 1437   AST 16 01/16/2015 1437   ALT 13* 01/16/2015 1437   ALKPHOS 36* 01/16/2015 1437   BILITOT 0.4 01/16/2015 1437   GFRNONAA >60 01/16/2015 1437   GFRAA >60 01/16/2015 1437       Component Value Date/Time   WBC 5.1 01/16/2015 1437   WBC 3.8* 02/12/2014 1004   WBC 7.0 05/27/2013 2118   HGB 12.7 01/16/2015 1437   HGB 13.6 02/12/2014 1004   HGB 14.6 05/27/2013 2118   HCT 36.9 01/16/2015 1437   HCT 38.8 02/12/2014 1004   HCT 41.4 05/27/2013 2118   MCV 90.0 01/16/2015 1437   MCV 88.8 02/12/2014 1004   MCV 88.5  05/27/2013 2118    Lipid Panel  No results found for: CHOL, TRIG, HDL, CHOLHDL, VLDL, LDLCALC, LDLDIRECT  ABG No results found for: PHART, PCO2ART, PO2ART, HCO3, TCO2, ACIDBASEDEF, O2SAT   Lab Results  Component Value Date   TSH 1.272 04/11/2013   BNP (last 3 results) No results for input(s): BNP in the last 8760 hours.  ProBNP (last 3 results) No results for input(s): PROBNP in the last 8760 hours.  Cardiac Panel (last 3 results) No results for input(s): CKTOTAL, CKMB, TROPONINI, RELINDX in the last 72 hours.  Iron/TIBC/Ferritin/ %Sat No results found for: IRON, TIBC, FERRITIN, IRONPCTSAT   EKG Orders placed or performed in visit on 03/20/15  . EKG 12-Lead     Prior Assessment and Plan Problem List as of 07/18/2015      Cardiovascular and Mediastinum   Mitral valve disorder   PVC (premature ventricular contraction)   Last Assessment & Plan 03/20/2015 Office Visit Written 03/20/2015 11:41 AM by Marinus Maw, MD    Her symptoms are well controlled with flecainide. She will continue her current meds.      PAC (premature atrial contraction)   Nonsustained ventricular tachycardia [facility])   Last Assessment & Plan 05/28/2014 Office Visit Edited 05/28/2014  1:38 PM by Jodelle Gross, NP    I have reviewed the cardiac monitor telemetry strips with the patient and collaborated with her concerning her activity around the time of the elevated HR. She states that she was not stressed at any of the times her HR went up. She is staying off of caffeine.   I will increase flecainide to 150 mg BID from 100 mg BID. I will sent her back to see Dr. Ladona Ridgel to review other options concerning treatment to include possible ablation vs medical management. She is intolerant to BB, CCB with dizziness, headaches. I am not sure all of this is related to PTSD, but could be contributing.   I have given her a copy of her cardiac monitor strips and results.        Nervous and Auditory   Autonomic  dysfunction   Last Assessment & Plan 03/20/2015 Office Visit Written 03/20/2015 11:44 AM by Marinus Maw, MD    Her pressure is ok today but has been low at times. She will continue midodrine. I instructed her to take extra midodrine if her low blood pressure persists. Also she is instructed to increase her salt intake.  Other   PALPITATIONS   Last Assessment & Plan 09/06/2014 Office Visit Written 09/06/2014 10:59 PM by Marinus MawGregg W Taylor, MD    Her symptoms remain but are tolerable on flecainide 100 mg twice daily. She will continue this medication. No other changes.       Abdominal pain, left lower quadrant   Sinus tachycardia [facility](HCC)   Last Assessment & Plan 05/15/2014 Office Visit Written 05/15/2014  3:03 PM by Jodelle GrossKathryn M Lawrence, NP    This is multifactorial with use of caffeine to include SunDrop cola and other drinks, along with anxiety. She continues to take flecainide 100mg  BID. Today in office HR is 96 per EKG at rest.   I have placed a cardiac monitor on her to assess her HR for 7 days, as she states her HR does not always go up every day. I will not add medications at this time. She is advised to eliminate caffeine from her diet entirely, as she may be very sensitive to this. Follow up appt after monitor returned.      Chest pain   Last Assessment & Plan 03/20/2015 Office Visit Written 03/20/2015 11:42 AM by Marinus MawGregg W Taylor, MD    This appears to be quiet. She will undergo watchful waiting.          Imaging: No results found.

## 2015-07-18 NOTE — Progress Notes (Signed)
Cardiology Office Note   Date:  07/18/2015   ID:  Tara Mahoney, DOB 1985/12/23, MRN [id number]  PCP:  Isabella Stalling, MD  Cardiologist: Arlington Calix, NP   Chief Complaint  Patient presents with  . Hypotension      History of Present Illness: Tara Mahoney is a 29 y.o. female who presents for ongoing assessment and management of hypertension, hx of NSVT with palpitations. She has been taking low dose atenolol with improvement in symptoms. Comes today with complaints of low BP. She was last seen by Dr. Ladona Ridgel in Sept 2016 and was still having some trouble with hypotension but no SVT.   She states that her BP drops into the 70's and she has had some syncope. She often feels fatigued and lightheaded even though she is taking midodrine BID.   Past Medical History  Diagnosis Date  . MVP (mitral valve prolapse)   . Palpitations   . Hyperlipidemia   . Headache(784.0)   . Heart murmur   . Complication of anesthesia   . PONV (postoperative nausea and vomiting)   . Dysrhythmia     sinus arrythmia  . Family history of anesthesia complication     PONV  . V tach (HCC)   . Blood transfusion without reported diagnosis     Past Surgical History  Procedure Laterality Date  . Cervical cerclage  2008  . Myringotomy    . Cervical cerclage  09/08/2011    Procedure: CERCLAGE CERVICAL;  Surgeon: Lazaro Arms, MD;  Location: [facility];  Service: Gynecology;  Laterality: N/A;  [name],CST-scrubbed; doctor scrubbed in @ [phone]  . Supracervical abdominal hysterectomy  01/26/2012    Procedure: HYSTERECTOMY SUPRACERVICAL ABDOMINAL;  Surgeon: Lazaro Arms, MD;  Location: [facility];  Service: Gynecology;;  converted [phone]  . Laparoscopy  08/09/2012    Procedure: LAPAROSCOPY DIAGNOSTIC;  Surgeon: Lazaro Arms, MD;  Location: [facility];  Service: Gynecology;  Laterality: N/A;  started at  1208-Vaginal trachelectomy (vaginal removal of cervical stump)   . Laparoscopic lysis of adhesions   08/09/2012    Procedure: LAPAROSCOPIC LYSIS OF ADHESIONS;  Surgeon: Lazaro Arms, MD;  Location: [facility];  Service: Gynecology;  Laterality: N/A;  . Abdominal hysterectomy       Current Outpatient Prescriptions  Medication Sig Dispense Refill  . mirtazapine (REMERON) 15 MG tablet Take 15 mg by mouth at bedtime.    . rosuvastatin (CRESTOR) 5 MG tablet Take 5 mg by mouth daily.    . sertraline (ZOLOFT) 100 MG tablet Take 100 mg by mouth daily.    . flecainide (TAMBOCOR) 150 MG tablet Take 1 tablet (150 mg total) by mouth 2 (two) times daily. 60 tablet 3  . midodrine (PROAMATINE) 10 MG tablet Take 1 tablet (10 mg total) by mouth 3 (three) times daily. 60 tablet 3   No current facility-administered medications for this visit.    Allergies:   Cardizem cd; Hydrocodone-acetaminophen; Metoprolol; Morphine and related; and Verapamil    Social History:  The patient  reports that she quit smoking about 5 years ago. Her smoking use included Cigarettes. She started smoking about 13 years ago. She quit after 1 year of use. She has never used smokeless tobacco. She reports that she does not drink alcohol or use illicit drugs.   Family History:  The patient's family history includes Heart disease in her brother; Hypertension in her brother; Ovarian cancer in her mother. There is no history of Anesthesia problems, Hypotension,  Pseudochol deficiency, Malignant hyperthermia, or Other.    ROS: All other systems are reviewed and negative. Unless otherwise mentioned in H&P    PHYSICAL EXAM: VS:  BP 90/58 mmHg  Pulse 61  Ht 5\' 3"  (1.6 m)  Wt 130 lb (58.968 kg)  BMI 23.03 kg/m2  SpO2 96%  LMP 05/04/2011 , BMI Body mass index is 23.03 kg/(m^2). GEN: Well nourished, well developed, in no acute distress HEENT: normal Neck: no JVD, carotid bruits, or masses Cardiac: RRR; no murmurs, rubs, or gallops,no edema  Respiratory:  clear to auscultation bilaterally, normal work of breathing GI: soft, nontender,  nondistended, + BS MS: no deformity or atrophy Skin: warm and dry, no rash Neuro:  Strength and sensation are intact Psych: euthymic mood, full affect   Recent Labs: 01/16/2015: ALT 13*; BUN 13; Creatinine, Ser 0.81; Hemoglobin 12.7; Magnesium 1.9; Platelets 112*; Potassium 4.2; Sodium 137    Lipid Panel No results found for: CHOL, TRIG, HDL, CHOLHDL, VLDL, LDLCALC, LDLDIRECT    Wt Readings from Last 3 Encounters:  07/18/15 130 lb (58.968 kg)  03/20/15 134 lb (60.782 kg)  02/17/15 139 lb (63.05 kg)     ASSESSMENT AND PLAN:  1.  Hypotension: I will stop the atenolol and increase the flecainide to 150 mg BID. She will increase the midodrine to 10 mg BID. Will see her in 2 weeks.   2. SVT: Concerned about hypotension with BB, so stopped it with increase in flecainide. I will see her on close follow up. If she has more symptoms or worsening hypotension she is to go to ER.    Current medicines are reviewed at length with the patient today.    Labs/ tests ordered today include: None   Disposition:   FU with 2 weeks.  Signed, Joni ReiningKathryn Natina Wiginton, NP  07/18/2015 3:21 PM    [facility] [address], [place], [place] [address] Phone: [phone]; Fax: [phone]

## 2015-07-23 ENCOUNTER — Telehealth: Payer: Self-pay | Admitting: Adult Health

## 2015-07-23 NOTE — Telephone Encounter (Signed)
[id number]-cc 

## 2015-07-23 NOTE — Telephone Encounter (Signed)
BP 153/101 highest , HR 105     Lowest BP 64/35, HR 70     Pt not on Atenolol, Joni ReiningKathryn Lawrence d/c'd it at last visit.Pt is on Midodrine 10 mg TID, Flecainide 150 mg BIB

## 2015-07-23 NOTE — Telephone Encounter (Signed)
Pt's medication changes have caused her to have increased heart rate and her BP run high

## 2015-07-23 NOTE — Telephone Encounter (Signed)
Will forward to Leda GauzeM Lenze

## 2015-07-23 NOTE — Telephone Encounter (Signed)
Her heart rates and blood pressures have been very complex and difficult to treat. We need more data to be able to make any neccesary adjustments. I would have her continue her meds as currently takign. Please have her record her heart rate and blood pressures 3 times a day for the next 3 days and call us back on Friday. Heart rates 50-110 would be acceptable if they are asymptomatic, and blood pressures of 90-150/50-90s. A range is likely to occur, our goal/focus would be to try to avoid the extreme lows and extreme highs.   Dominga FerryJ Delante Karapetyan MD

## 2015-07-23 NOTE — Telephone Encounter (Signed)
How high is BP and HR? Can take Atenolol 25 mg 1/2 tablet daily instead of BID.

## 2015-07-24 NOTE — Telephone Encounter (Signed)
LM on home machine per pt with Dr Verna CzechBranch's instructions

## 2015-08-01 ENCOUNTER — Encounter: Payer: Self-pay | Admitting: Adult Health

## 2015-08-01 ENCOUNTER — Ambulatory Visit (INDEPENDENT_AMBULATORY_CARE_PROVIDER_SITE_OTHER): Payer: Medicaid Other | Admitting: Adult Health

## 2015-08-01 VITALS — BP 150/108 | HR 99 | Ht 63.0 in | Wt 131.0 lb

## 2015-08-01 DIAGNOSIS — I950 Idiopathic hypotension: Secondary | ICD-10-CM | POA: Diagnosis not present

## 2015-08-01 DIAGNOSIS — F419 Anxiety disorder, unspecified: Secondary | ICD-10-CM

## 2015-08-01 DIAGNOSIS — R002 Palpitations: Secondary | ICD-10-CM

## 2015-08-01 NOTE — Progress Notes (Deleted)
Name: Tara Mahoney    DOB: 1986-02-23  Age: 30 y.o.  MR#: [id number]       PCP:  Isabella Stalling, MD      Insurance: Payor: MEDICAID [place] / Plan: MEDICAID [place] ACCESS / Product Type: *No Product type* /   CC:   No chief complaint on file.   VS Filed Vitals:   08/01/15 1458  BP: 150/108  Pulse: 99  Height: 5\' 3"  (1.6 m)  Weight: 131 lb (59.421 kg)  SpO2: 98%    Weights Current Weight  08/01/15 131 lb (59.421 kg)  07/18/15 130 lb (58.968 kg)  03/20/15 134 lb (60.782 kg)    Blood Pressure  BP Readings from Last 3 Encounters:  08/01/15 150/108  07/18/15 90/58  03/20/15 100/64     Admit date:  (Not on file) Last encounter with RMR:  07/23/2015   Allergy Cardizem cd; Hydrocodone-acetaminophen; Metoprolol; Morphine and related; and Verapamil  Current Outpatient Prescriptions  Medication Sig Dispense Refill  . flecainide (TAMBOCOR) 150 MG tablet Take 1 tablet (150 mg total) by mouth 2 (two) times daily. 60 tablet 3  . midodrine (PROAMATINE) 10 MG tablet Take 1 tablet (10 mg total) by mouth 3 (three) times daily. 60 tablet 3  . rosuvastatin (CRESTOR) 5 MG tablet Take 5 mg by mouth daily.    . TRAZODONE HCL PO Take by mouth.     No current facility-administered medications for this visit.    Discontinued Meds:    Medications Discontinued During This Encounter  Medication Reason  . mirtazapine (REMERON) 15 MG tablet Error  . sertraline (ZOLOFT) 100 MG tablet Error    Patient Active Problem List   Diagnosis Date Noted  . Autonomic dysfunction 03/20/2015  . Chest pain 02/27/2014  . Sinus tachycardia (HCC) 02/06/2014  . PVC (premature ventricular contraction) 05/09/2013  . PAC (premature atrial contraction) 05/09/2013  . Nonsustained ventricular tachycardia (HCC) 05/09/2013  . Abdominal pain, left lower quadrant 02/20/2013  . 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 PALPITATIONS 09/23/2010    LABS    Component Value Date/Time   NA 137 01/16/2015 1437   NA 142  02/12/2014 1004   NA 137 05/27/2013 2118   K 4.2 01/16/2015 1437   K 3.8 02/12/2014 1004   K 3.7 05/27/2013 2118   CL 106 01/16/2015 1437   CL 107 02/12/2014 1004   CL 101 05/27/2013 2118   CO2 26 01/16/2015 1437   CO2 25 02/12/2014 1004   CO2 27 05/27/2013 2118   GLUCOSE 83 01/16/2015 1437   GLUCOSE 91 02/12/2014 1004   GLUCOSE 97 05/27/2013 2118   BUN 13 01/16/2015 1437   BUN 11 02/12/2014 1004   BUN 14 05/27/2013 2118   CREATININE 0.81 01/16/2015 1437   CREATININE 0.99 02/12/2014 1004   CREATININE 0.96 05/27/2013 2118   CREATININE 0.86 04/11/2013 1034   CALCIUM 8.5* 01/16/2015 1437   CALCIUM 9.0 02/12/2014 1004   CALCIUM 10.1 05/27/2013 2118   GFRNONAA >60 01/16/2015 1437   GFRNONAA 76* 02/12/2014 1004   GFRNONAA 79* 05/27/2013 2118   GFRAA >60 01/16/2015 1437   GFRAA 88* 02/12/2014 1004   GFRAA >90 05/27/2013 2118   CMP     Component Value Date/Time   NA 137 01/16/2015 1437   K 4.2 01/16/2015 1437   CL 106 01/16/2015 1437   CO2 26 01/16/2015 1437   GLUCOSE 83 01/16/2015 1437   BUN 13 01/16/2015 1437   CREATININE 0.81 01/16/2015 1437   CREATININE 0.86 04/11/2013  1034   CALCIUM 8.5* 01/16/2015 1437   PROT 6.3* 01/16/2015 1437   ALBUMIN 3.8 01/16/2015 1437   AST 16 01/16/2015 1437   ALT 13* 01/16/2015 1437   ALKPHOS 36* 01/16/2015 1437   BILITOT 0.4 01/16/2015 1437   GFRNONAA >60 01/16/2015 1437   GFRAA >60 01/16/2015 1437       Component Value Date/Time   WBC 5.1 01/16/2015 1437   WBC 3.8* 02/12/2014 1004   WBC 7.0 05/27/2013 2118   HGB 12.7 01/16/2015 1437   HGB 13.6 02/12/2014 1004   HGB 14.6 05/27/2013 2118   HCT 36.9 01/16/2015 1437   HCT 38.8 02/12/2014 1004   HCT 41.4 05/27/2013 2118   MCV 90.0 01/16/2015 1437   MCV 88.8 02/12/2014 1004   MCV 88.5 05/27/2013 2118    Lipid Panel  No results found for: CHOL, TRIG, HDL, CHOLHDL, VLDL, LDLCALC, LDLDIRECT  ABG No results found for: PHART, PCO2ART, PO2ART, HCO3, TCO2, ACIDBASEDEF, O2SAT    Lab Results  Component Value Date   TSH 1.272 04/11/2013   BNP (last 3 results) No results for input(s): BNP in the last 8760 hours.  ProBNP (last 3 results) No results for input(s): PROBNP in the last 8760 hours.  Cardiac Panel (last 3 results) No results for input(s): CKTOTAL, CKMB, TROPONINI, RELINDX in the last 72 hours.  Iron/TIBC/Ferritin/ %Sat No results found for: IRON, TIBC, FERRITIN, IRONPCTSAT   EKG Orders placed or performed in visit on 03/20/15  . EKG 12-Lead     Prior Assessment and Plan Problem List as of 08/01/2015      Cardiovascular and Mediastinum   Mitral valve disorder   PVC (premature ventricular contraction)   Last Assessment & Plan 03/20/2015 Office Visit Written 03/20/2015 11:41 AM by Marinus MawGregg W Taylor, MD    Her symptoms are well controlled with flecainide. She will continue her current meds.      PAC (premature atrial contraction)   Nonsustained ventricular tachycardia [facility](HCC)   Last Assessment & Plan 05/28/2014 Office Visit Edited 05/28/2014  1:38 PM by Jodelle GrossKathryn M Lawrence, NP    I have reviewed the cardiac monitor telemetry strips with the patient and collaborated with her concerning her activity around the time of the elevated HR. She states that she was not stressed at any of the times her HR went up. She is staying off of caffeine.   I will increase flecainide to 150 mg BID from 100 mg BID. I will sent her back to see Dr. Ladona Ridgelaylor to review other options concerning treatment to include possible ablation vs medical management. She is intolerant to BB, CCB with dizziness, headaches. I am not sure all of this is related to PTSD, but could be contributing.   I have given her a copy of her cardiac monitor strips and results.        Nervous and Auditory   Autonomic dysfunction   Last Assessment & Plan 03/20/2015 Office Visit Written 03/20/2015 11:44 AM by Marinus MawGregg W Taylor, MD    Her pressure is ok today but has been low at times. She will continue midodrine. I  instructed her to take extra midodrine if her low blood pressure persists. Also she is instructed to increase her salt intake.        Other   PALPITATIONS   Last Assessment & Plan 09/06/2014 Office Visit Written 09/06/2014 10:59 PM by Marinus MawGregg W Taylor, MD    Her symptoms remain but are tolerable on flecainide 100 mg twice daily. She will  continue this medication. No other changes.       Abdominal pain, left lower quadrant   Sinus tachycardia [facility])   Last Assessment & Plan 05/15/2014 Office Visit Written 05/15/2014  3:03 PM by Jodelle Gross, NP    This is multifactorial with use of caffeine to include SunDrop cola and other drinks, along with anxiety. She continues to take flecainide 100mg  BID. Today in office HR is 96 per EKG at rest.   I have placed a cardiac monitor on her to assess her HR for 7 days, as she states her HR does not always go up every day. I will not add medications at this time. She is advised to eliminate caffeine from her diet entirely, as she may be very sensitive to this. Follow up appt after monitor returned.      Chest pain   Last Assessment & Plan 03/20/2015 Office Visit Written 03/20/2015 11:42 AM by Marinus Maw, MD    This appears to be quiet. She will undergo watchful waiting.          Imaging: No results found.

## 2015-08-01 NOTE — Patient Instructions (Signed)
Your physician recommends that you schedule a follow-up appointment in: 6 weeks   Your physician recommends that you continue on your current medications as directed. Please refer to the Current Medication list given to you today.  You have been given information to [facility]  If you need a refill on your cardiac medications before your next appointment, please call your pharmacy.  Thank you for choosing  [facility]! '

## 2015-08-01 NOTE — Progress Notes (Signed)
Cardiology Office Note   Date:  08/01/2015   ID:  Tara MarcusCheryl Bong, DOB 1985/09/28, MRN [id number]  PCP:  Isabella StallingNDIEGO,RICHARD M, MD  Cardiologist: Arlington CalixBranch/  Azari Hasler, NP   No chief complaint on file.     History of Present Illness: Tara Mahoney is a 30 y.o. female who presents for ongoing assessment and management of hypertension, hx of NSVT with palpitations. She has been taking low dose atenolol with improvement in symptoms. Comes today with complaints of low BP. She was last seen by Dr. Ladona Ridgelaylor in Sept 2016 and was still having some trouble with hypotension but no SVT. She states that her BP drops into the 70's and she has had some syncope. She often feels fatigued and lightheaded even though she is taking midodrine BID.   I stopped atenolol and increased flecainide to 150 mg BID. Increase midodrine to 10 mg BID. She is here for follow up.   She comes today feeling some better, but is still very focused on her BP and her HR. She states that she takes it a lot at home and counts her pulse a lot. She is no longer experiencing rapid HR.     Past Medical History  Diagnosis Date  . MVP (mitral valve prolapse)   . Palpitations   . Hyperlipidemia   . Headache(784.0)   . Heart murmur   . Complication of anesthesia   . PONV (postoperative nausea and vomiting)   . Dysrhythmia     sinus arrythmia  . Family history of anesthesia complication     PONV  . V tach (HCC)   . Blood transfusion without reported diagnosis     Past Surgical History  Procedure Laterality Date  . Cervical cerclage  2008  . Myringotomy    . Cervical cerclage  09/08/2011    Procedure: CERCLAGE CERVICAL;  Surgeon: Lazaro ArmsLuther H Eure, MD;  Location: [facility];  Service: Gynecology;  Laterality: N/A;  [name],CST-scrubbed; doctor scrubbed in @ [phone]  . Supracervical abdominal hysterectomy  01/26/2012    Procedure: HYSTERECTOMY SUPRACERVICAL ABDOMINAL;  Surgeon: Lazaro ArmsLuther H Eure, MD;  Location: [facility];  Service:  Gynecology;;  converted [phone]  . Laparoscopy  08/09/2012    Procedure: LAPAROSCOPY DIAGNOSTIC;  Surgeon: Lazaro ArmsLuther H Eure, MD;  Location: [facility];  Service: Gynecology;  Laterality: N/A;  started at  1208-Vaginal trachelectomy (vaginal removal of cervical stump)   . Laparoscopic lysis of adhesions  08/09/2012    Procedure: LAPAROSCOPIC LYSIS OF ADHESIONS;  Surgeon: Lazaro ArmsLuther H Eure, MD;  Location: [facility];  Service: Gynecology;  Laterality: N/A;  . Abdominal hysterectomy       Current Outpatient Prescriptions  Medication Sig Dispense Refill  . flecainide (TAMBOCOR) 150 MG tablet Take 1 tablet (150 mg total) by mouth 2 (two) times daily. 60 tablet 3  . midodrine (PROAMATINE) 10 MG tablet Take 1 tablet (10 mg total) by mouth 3 (three) times daily. 60 tablet 3  . mirtazapine (REMERON) 15 MG tablet Take 15 mg by mouth at bedtime.    . rosuvastatin (CRESTOR) 5 MG tablet Take 5 mg by mouth daily.    . sertraline (ZOLOFT) 100 MG tablet Take 100 mg by mouth daily.     No current facility-administered medications for this visit.    Allergies:   Cardizem cd; Hydrocodone-acetaminophen; Metoprolol; Morphine and related; and Verapamil    Social History:  The patient  reports that she quit smoking about 5 years ago. Her smoking use included Cigarettes. She started smoking  about 13 years ago. She quit after 1 year of use. She has never used smokeless tobacco. She reports that she does not drink alcohol or use illicit drugs.   Family History:  The patient's family history includes Heart disease in her brother; Hypertension in her brother; Ovarian cancer in her mother. There is no history of Anesthesia problems, Hypotension, Pseudochol deficiency, Malignant hyperthermia, or Other.    ROS: All other systems are reviewed and negative. Unless otherwise mentioned in H&P    PHYSICAL EXAM: VS:  LMP 05/04/2011 , BMI There is no weight on file to calculate BMI. GEN: Well nourished, well developed, in no acute  distress HEENT: normal Neck: no JVD, carotid bruits, or masses Cardiac: RRR; no murmurs, rubs, or gallops,no edema  Respiratory:  clear to auscultation bilaterally, normal work of breathing GI: soft, nontender, nondistended, + BS MS: no deformity or atrophy Skin: warm and dry, no rash Neuro:  Strength and sensation are intact Psych: euthymic mood, full affect  Recent Labs: 01/16/2015: ALT 13*; BUN 13; Creatinine, Ser 0.81; Hemoglobin 12.7; Magnesium 1.9; Platelets 112*; Potassium 4.2; Sodium 137    Lipid Panel No results found for: CHOL, TRIG, HDL, CHOLHDL, VLDL, LDLCALC, LDLDIRECT    Wt Readings from Last 3 Encounters:  07/18/15 130 lb (58.968 kg)  03/20/15 134 lb (60.782 kg)  02/17/15 139 lb (63.05 kg)      ASSESSMENT AND PLAN:  1. Rapid HR:  I have reviewed her BP and HR chart that she brings with her. Will not make any changes at this time. Continue current regimen.   2. Hypotension: Well controlled currently with normal ranges throughout the day.   3. Anxiety: I have asked her to seek psychological counseling for this. She states that she had been seen by a psychiatrist in the past but stopped going. I have asked her to see them again or seek counseling from [facility]. Their number is provided.    Current medicines are reviewed at length with the patient today.    Labs/ tests ordered today include: None No orders of the defined types were placed in this encounter.     Disposition:   FU with 6 weeks to 2 months.    Signed, Joni Reining, NP  08/01/2015 7:23 AM    [facility] [address], [place], [place] [address] Phone: [phone]; Fax: [phone]

## 2015-09-17 ENCOUNTER — Ambulatory Visit (INDEPENDENT_AMBULATORY_CARE_PROVIDER_SITE_OTHER): Payer: Medicaid Other | Admitting: Cardiology

## 2015-09-17 ENCOUNTER — Encounter: Payer: Self-pay | Admitting: Cardiology

## 2015-09-17 VITALS — BP 110/78 | HR 89 | Ht 63.0 in | Wt 130.6 lb

## 2015-09-17 DIAGNOSIS — R7309 Other abnormal glucose: Secondary | ICD-10-CM

## 2015-09-17 DIAGNOSIS — E785 Hyperlipidemia, unspecified: Secondary | ICD-10-CM | POA: Diagnosis not present

## 2015-09-17 DIAGNOSIS — G909 Disorder of the autonomic nervous system, unspecified: Secondary | ICD-10-CM

## 2015-09-17 DIAGNOSIS — I493 Ventricular premature depolarization: Secondary | ICD-10-CM

## 2015-09-17 DIAGNOSIS — R002 Palpitations: Secondary | ICD-10-CM

## 2015-09-17 MED ORDER — MIDODRINE HCL 10 MG PO TABS: 10.0000 mg | ORAL_TABLET | Freq: Three times a day (TID) | ORAL | Status: DC

## 2015-09-17 NOTE — Patient Instructions (Addendum)
Medication Instructions:  Your physician recommends that you continue on your current medications as directed. Please refer to the Current Medication list given to you today.   Labwork: Your physician recommends that you return for lab work in: asap Cbc cmet tsh a1c Fasting lipid  Magnesium    Testing/Procedures: none  Follow-Up: Your physician recommends that you schedule a follow-up appointment in: 3 months    Any Other Special Instructions Will Be Listed Below (If Applicable).  I have given you a prescription for compression knee high's     If you need a refill on your cardiac medications before your next appointment, please call your pharmacy.

## 2015-09-17 NOTE — Progress Notes (Signed)
Patient ID: Tara Mahoney, female   DOB: Apr 04, 1986, 30 y.o.   MRN: [id number]     Clinical Summary Ms. Egley is a 30 y.o.female seen today for follow up of the following medical problems.   1. 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 - has been on flecanide most recently with improved but not resolved symptoms - reports mild stable symptoms since last visit.    2. Chest pain - coronary CT with overall normal coronaries - stress echo 01/2014 Duke treadmill score of 10, no ischemia by EKG or imaging  - no recent chest pain symptoms   3. Autonomic insufficiency - stable dizziness, occurs daily mainly with position changes or bending over.  - drinks water x 10-12 glasses, occasional tea, sports drinks x 3. Compliant with midodrine.     Social history : has two  girls ages 83 and 3 Past Medical History  Diagnosis Date  . MVP (mitral valve prolapse)   . Palpitations   . Hyperlipidemia   . Headache(784.0)   . Heart murmur   . Complication of anesthesia   . PONV (postoperative nausea and vomiting)   . Dysrhythmia     sinus arrythmia  . Family history of anesthesia complication     PONV  . V tach (HCC)   . Blood transfusion without reported diagnosis      Allergies  Allergen Reactions  . Cardizem Cd [Diltiazem Hcl Er Beads]     rash  . Hydrocodone-Acetaminophen Itching and Nausea And Vomiting  . Metoprolol     Headaches, dizzyness  . Morphine And Related     nausea  . Verapamil     Racing heart, dizzyness, headache,shaking all over her body      Current Outpatient Prescriptions  Medication Sig Dispense Refill  . flecainide (TAMBOCOR) 150 MG tablet Take 1 tablet (150 mg total) by mouth 2 (two) times daily. 60 tablet 3  . midodrine (PROAMATINE) 10 MG tablet Take 1 tablet (10 mg total) by mouth 3 (three) times daily. 60 tablet 3  . rosuvastatin (CRESTOR) 5 MG tablet Take 5 mg by mouth daily.    . TRAZODONE HCL PO Take by mouth.     No current  facility-administered medications for this visit.     Past Surgical History  Procedure Laterality Date  . Cervical cerclage  2008  . Myringotomy    . Cervical cerclage  09/08/2011    Procedure: CERCLAGE CERVICAL;  Surgeon: Lazaro Arms, MD;  Location: [facility];  Service: Gynecology;  Laterality: N/A;  [name],CST-scrubbed; doctor scrubbed in @ [phone]  . Supracervical abdominal hysterectomy  01/26/2012    Procedure: HYSTERECTOMY SUPRACERVICAL ABDOMINAL;  Surgeon: Lazaro Arms, MD;  Location: [facility];  Service: Gynecology;;  converted [phone]  . Laparoscopy  08/09/2012    Procedure: LAPAROSCOPY DIAGNOSTIC;  Surgeon: Lazaro Arms, MD;  Location: [facility];  Service: Gynecology;  Laterality: N/A;  started at  1208-Vaginal trachelectomy (vaginal removal of cervical stump)   . Laparoscopic lysis of adhesions  08/09/2012    Procedure: LAPAROSCOPIC LYSIS OF ADHESIONS;  Surgeon: Lazaro Arms, MD;  Location: [facility];  Service: Gynecology;  Laterality: N/A;  . Abdominal hysterectomy       Allergies  Allergen Reactions  . Cardizem Cd [Diltiazem Hcl Er Beads]     rash  . Hydrocodone-Acetaminophen Itching and Nausea And Vomiting  . Metoprolol     Headaches, dizzyness  . Morphine And Related  nausea  . Verapamil     Racing heart, dizzyness, headache,shaking all over her body       Family History  Problem Relation Age of Onset  . Hypertension Brother   . Heart disease Brother     hole in heart  . Anesthesia problems Neg Hx   . Hypotension Neg Hx   . Pseudochol deficiency Neg Hx   . Malignant hyperthermia Neg Hx   . Other Neg Hx   . Ovarian cancer Mother      Social History Ms. Jaffee reports that she quit smoking about 6 years ago. Her smoking use included Cigarettes. She started smoking about 13 years ago. She quit after 1 year of use. She has never used smokeless tobacco. Ms. Mcgurn reports that she does not drink alcohol.   Review of Systems CONSTITUTIONAL: No weight loss,  fever, chills, weakness or fatigue.  HEENT: Eyes: No visual loss, blurred vision, double vision or yellow sclerae.No hearing loss, sneezing, congestion, runny nose or sore throat.  SKIN: No rash or itching.  CARDIOVASCULAR: per HPI RESPIRATORY: No shortness of breath, cough or sputum.  GASTROINTESTINAL: No anorexia, nausea, vomiting or diarrhea. No abdominal pain or blood.  GENITOURINARY: No burning on urination, no polyuria NEUROLOGICAL: dizziness MUSCULOSKELETAL: No muscle, back pain, joint pain or stiffness.  LYMPHATICS: No enlarged nodes. No history of splenectomy.  PSYCHIATRIC: No history of depression or anxiety.  ENDOCRINOLOGIC: No reports of sweating, cold or heat intolerance. No polyuria or polydipsia.  Marland Kitchen   Physical Examination Filed Vitals:   09/17/15 0923  BP: 110/78  Pulse: 89   Filed Vitals:   09/17/15 0923  Height:  (1.6 m)  Weight: 130 lb 9.6 oz (59.24 kg)    Gen: resting comfortably, no acute distress HEENT: no scleral icterus, pupils equal round and reactive, no palptable cervical adenopathy,  CV: RRR, no m/r/g, no jvd Resp: Clear to auscultation bilaterally GI: abdomen is soft, non-tender, non-distended, normal bowel sounds, no hepatosplenomegaly MSK: extremities are warm, no edema.  Skin: warm, no rash Neuro:  no focal deficits Psych: appropriate affect   Diagnostic Studies 01/2014 Stress echo Study Conclusions  - Stress ECG conclusions: The stress ECG was normal. Duke treadmill score of 10, consistent with low risk for major cardiac events. - Staged echo: Normal echo stress - From notes patient is on flecanide. There is no QRS widening with exercise.  Impressions:  - Normal study after maximal exercise.   03/2013 echo Study Conclusions  - Study data: Technically adequate study - Left ventricle: The cavity size was normal. Wall thickness was normal. Systolic function was normal. The estimated ejection fraction was in the range  of 55% to 60%. Wall motion was normal; there were no regional wall motion abnormalities. Left ventricular diastolic function parameters were normal. - Aortic valve: Valve area: 1.7cm^2(VTI). Valve area: 1.65cm^2 (Vmax). - Mitral valve: Prominent somewhat redundant anterior leaflet. Smaller more dimunitive posterior leaflet. Mild regurgitation. The MR vena contracta is 0.3 cm. The jet is eccentric and posteriorly directed. - Left atrium: The atrium was normal in size.  05/2013 GXT No ischemic changes, no QRS prolongation    04/2013 Holter monitor 6 beat run of NSVT that was symptomatic. Other symptoms correlated with NSR/  05/2013 Holter No significant arrhythmias   05/2014 7 day monitor -symptoms correlate with NSR and sinus tach.   09/2014 Cardiac CT IMPRESSION: 1. Coronary calcium score of 0.5. This was 37 percentile for age and sex matched control.  2. Normal  coronary origin. Right dominance.  3. Minimal calcified plaque in the proximal RCA, otherwise normal coronaries.  4. Present PFO.  Tobias Alexander  Assessment and Plan  1. Palpitations - initial monitor showed PVCs and NSVT, she has been on flecanide. Repeat monitors since that time have not showed any recurrent arrhythmias. Palpitations are stable, continues to have dizziness that appears to be more related to autonomic insufficiency as opposed to symptomatic arrhythmia - continue current meds  2. Chest pain - negative cardiac CT and stress testing recently, no recent symptoms - continue to follow.    3. Autonomic insufficiency - remains orthostatic in clinc, BP 120/78 p 74 with lying, standing bp 104/76 and HR 100 - continue midodrine, aggressive oral hydration and fluid intake. Will also prescribe compression stockings. If persists consider florinef at next visit. Of note she is childbearing age however has had a hysterectomy, medications will not be limited.   4. Hyperlipideima -  repeat lipid panel - continue statin  F/u 3 months      Antoine Poche, M.D.

## 2015-09-18 LAB — LIPID PANEL
CHOL/HDL RATIO: 3.8 ratio (ref <=5.0)
CHOLESTEROL: 119 mg/dL — AB (ref 125–200)
HDL: 31 mg/dL — ABNORMAL LOW (ref >=46)
LDL Cholesterol: 62 mg/dL (ref <130)
Triglycerides: 130 mg/dL (ref <150)
VLDL: 26 mg/dL (ref <30)

## 2015-09-18 LAB — CBC WITH DIFFERENTIAL/PLATELET
Basophils Absolute: 0 10*3/uL (ref 0.0–0.1)
Basophils Relative: 0 % (ref 0–1)
Eosinophils Absolute: 0.4 10*3/uL (ref 0.0–0.7)
Eosinophils Relative: 10 % — ABNORMAL HIGH (ref 0–5)
HCT: 42.7 % (ref 36.0–46.0)
Hemoglobin: 14.5 g/dL (ref 12.0–15.0)
Lymphocytes Relative: 43 % (ref 12–46)
Lymphs Abs: 1.6 10*3/uL (ref 0.7–4.0)
MCH: 30.4 pg (ref 26.0–34.0)
MCHC: 34 g/dL (ref 30.0–36.0)
MCV: 89.5 fL (ref 78.0–100.0)
MPV: 12.6 fL — AB (ref 8.6–12.4)
Monocytes Absolute: 0.2 10*3/uL (ref 0.1–1.0)
Monocytes Relative: 6 % (ref 3–12)
NEUTROS PCT: 41 % — AB (ref 43–77)
Neutro Abs: 1.6 10*3/uL — ABNORMAL LOW (ref 1.7–7.7)
Platelets: 124 10*3/uL — ABNORMAL LOW (ref 150–400)
RBC: 4.77 MIL/uL (ref 3.87–5.11)
RDW: 13.5 % (ref 11.5–15.5)
WBC: 3.8 10*3/uL — ABNORMAL LOW (ref 4.0–10.5)

## 2015-09-18 LAB — COMPREHENSIVE METABOLIC PANEL
ALBUMIN: 4.6 g/dL (ref 3.6–5.1)
ALT: 9 U/L (ref 6–29)
AST: 14 U/L (ref 10–30)
Alkaline Phosphatase: 36 U/L (ref 33–115)
BUN: 12 mg/dL (ref 7–25)
CO2: 27 mmol/L (ref 20–31)
Calcium: 9.6 mg/dL (ref 8.6–10.2)
Chloride: 102 mmol/L (ref 98–110)
Creat: 0.95 mg/dL (ref 0.50–1.10)
Glucose, Bld: 95 mg/dL (ref 65–99)
Potassium: 4.2 mmol/L (ref 3.5–5.3)
Sodium: 138 mmol/L (ref 135–146)
TOTAL PROTEIN: 7.2 g/dL (ref 6.1–8.1)
Total Bilirubin: 0.3 mg/dL (ref 0.2–1.2)

## 2015-09-18 LAB — HEMOGLOBIN A1C
Hgb A1c MFr Bld: 5.2 % (ref <5.7)
MEAN PLASMA GLUCOSE: 103 mg/dL (ref <117)

## 2015-09-18 LAB — MAGNESIUM: MAGNESIUM: 1.7 mg/dL (ref 1.5–2.5)

## 2015-09-18 LAB — TSH: TSH: 1.15 mIU/L

## 2015-09-22 ENCOUNTER — Telehealth: Payer: Self-pay

## 2015-09-22 NOTE — Telephone Encounter (Signed)
-----   Message from Antoine PocheJonathan F Branch, MD sent at 09/19/2015  4:42 PM EST ----- Lets have her stay off it for now, we will continue to monitor her cholesterol   J BrancH MD ----- Message -----    From: Fonnie BirkenheadLisa R Pati Thinnes, CMA    Sent: 09/19/2015   3:52 PM      To: Antoine PocheJonathan F Branch, MD  She has been off of Crestor fro about 6 weeks per pt. Just an [place]

## 2015-09-22 NOTE — Telephone Encounter (Signed)
Let pt know she does not have to take Crestor. She had been off of it for 6 weeks and we will monitor her cholesterol.

## 2015-11-24 ENCOUNTER — Encounter: Payer: Self-pay | Admitting: Cardiology

## 2015-11-26 ENCOUNTER — Encounter (HOSPITAL_COMMUNITY): Payer: Self-pay

## 2015-11-26 ENCOUNTER — Emergency Department (HOSPITAL_COMMUNITY): Payer: Medicaid Other

## 2015-11-26 ENCOUNTER — Emergency Department (HOSPITAL_COMMUNITY)
Admission: EM | Admit: 2015-11-26 | Discharge: 2015-11-26 | Disposition: A | Discharge status: Home / Self Care | Payer: Medicaid Other | Attending: Emergency Medicine | Admitting: Emergency Medicine

## 2015-11-26 DIAGNOSIS — S4992XA Unspecified injury of left shoulder and upper arm, initial encounter: Secondary | ICD-10-CM | POA: Diagnosis present

## 2015-11-26 DIAGNOSIS — Y929 Unspecified place or not applicable: Secondary | ICD-10-CM | POA: Diagnosis not present

## 2015-11-26 DIAGNOSIS — S161XXA Strain of muscle, fascia and tendon at neck level, initial encounter: Secondary | ICD-10-CM | POA: Diagnosis not present

## 2015-11-26 DIAGNOSIS — Y939 Activity, unspecified: Secondary | ICD-10-CM | POA: Diagnosis not present

## 2015-11-26 DIAGNOSIS — Y999 Unspecified external cause status: Secondary | ICD-10-CM | POA: Insufficient documentation

## 2015-11-26 DIAGNOSIS — S46912A Strain of unspecified muscle, fascia and tendon at shoulder and upper arm level, left arm, initial encounter: Secondary | ICD-10-CM | POA: Diagnosis not present

## 2015-11-26 DIAGNOSIS — E785 Hyperlipidemia, unspecified: Secondary | ICD-10-CM | POA: Insufficient documentation

## 2015-11-26 DIAGNOSIS — Z87891 Personal history of nicotine dependence: Secondary | ICD-10-CM | POA: Diagnosis not present

## 2015-11-26 MED ORDER — IBUPROFEN 600 MG PO TABS: 600.0000 mg | ORAL_TABLET | Freq: Four times a day (QID) | ORAL | Status: DC | PRN
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
## 2015-11-26 NOTE — Discharge Instructions (Signed)

## 2015-11-26 NOTE — ED Notes (Signed)
J. Idol, PA at bedside. 
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
## 2015-12-19 ENCOUNTER — Other Ambulatory Visit: Payer: Self-pay | Admitting: Adult Health

## 2015-12-29 ENCOUNTER — Ambulatory Visit: Payer: Medicaid Other | Admitting: Cardiology

## 2016-01-05 ENCOUNTER — Ambulatory Visit: Payer: Medicaid Other | Admitting: Cardiology

## 2016-01-05 ENCOUNTER — Ambulatory Visit (INDEPENDENT_AMBULATORY_CARE_PROVIDER_SITE_OTHER): Payer: Medicaid Other | Admitting: Cardiology

## 2016-01-05 ENCOUNTER — Encounter: Payer: Self-pay | Admitting: Cardiology

## 2016-01-05 VITALS — BP 106/64 | HR 105 | Ht 63.0 in | Wt 126.0 lb

## 2016-01-05 DIAGNOSIS — E785 Hyperlipidemia, unspecified: Secondary | ICD-10-CM | POA: Diagnosis not present

## 2016-01-05 DIAGNOSIS — R0789 Other chest pain: Secondary | ICD-10-CM

## 2016-01-05 DIAGNOSIS — G909 Disorder of the autonomic nervous system, unspecified: Secondary | ICD-10-CM

## 2016-01-05 DIAGNOSIS — R002 Palpitations: Secondary | ICD-10-CM | POA: Diagnosis not present

## 2016-01-05 MED ORDER — FLUDROCORTISONE ACETATE 0.1 MG PO TABS: 0.1000 mg | ORAL_TABLET | Freq: Every day | ORAL | Status: DC

## 2016-01-05 NOTE — Patient Instructions (Signed)
Medication Instructions:  START FLORINEF 0.1 MG DAILY   Labwork: NONE  Testing/Procedures: NONE  Follow-Up: Your physician recommends that you schedule a follow-up appointment in: 3 MONTHS    Any Other Special Instructions Will Be Listed Below (If Applicable).     If you need a refill on your cardiac medications before your next appointment, please call your pharmacy.

## 2016-01-05 NOTE — Progress Notes (Signed)
Clinical Summary [name] is a 30 y.o.female seen today for follow up of the following medical problems.   1. 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 - has been on flecanide most recently with improved but not resolved symptoms  - since last visit continued palpitations. Lightheadness and dizziness. Episode of syncope after lifting heavy piece ofwood and standing quickly.  - compliant with flecanide, midodrine 10mg  tid. - stays well hydrated. Drinks water and powerade.  - compression stockings are uncomfortable, does not wear regularly.     2. Chest pain - coronary CT with overall normal coronaries - stress echo 01/2014 Duke treadmill score of 10, no ischemia by EKG or imaging  - no recent chest pain symptoms   3. Autonomic insufficiency - stable dizziness, occurs daily mainly with position changes or bending over.  - drinks water x 10-12 glasses, occasional tea, sports drinks x 3. Compliant with midodrine.  - history of partial hysterectomy, medical therapy not limited despite childbearing age.    4. Hyperlipidemia - had been on crestor at some point in the past. Now off statin. Last lipid panel with LDL 62    [place]    Past Medical History  Diagnosis Date  . MVP (mitral valve prolapse)   . Palpitations   . Hyperlipidemia   . Headache(784.0)   . Heart murmur   . Complication of anesthesia   . PONV (postoperative nausea and vomiting)   . Dysrhythmia     sinus arrythmia  . Family history of anesthesia complication     PONV  . V tach (HCC)   . Blood transfusion without reported diagnosis      Allergies  Allergen Reactions  . Cardizem Cd [Diltiazem Hcl Er Beads]     rash  . Hydrocodone-Acetaminophen Itching and Nausea And Vomiting  . Metoprolol     Headaches, dizzyness  . Morphine And Related     nausea  . Verapamil     Racing heart, dizzyness, headache,shaking all over her body      Current  Outpatient Prescriptions  Medication Sig Dispense Refill  . flecainide (TAMBOCOR) 150 MG tablet TAKE ONE TABLET BY MOUTH TWICE A DAY 60 tablet 6  . ibuprofen (ADVIL,MOTRIN) 600 MG tablet Take 1 tablet (600 mg total) by mouth every 6 (six) hours as needed. 30 tablet 0  . midodrine (PROAMATINE) 10 MG tablet Take 1 tablet (10 mg total) by mouth 3 (three) times daily. 60 tablet 3  . rosuvastatin (CRESTOR) 5 MG tablet Take 5 mg by mouth daily. Reported on 09/17/2015    . TRAZODONE HCL PO Take by mouth.     No current facility-administered medications for this visit.     Past Surgical History  Procedure Laterality Date  . Cervical cerclage  2008  . Myringotomy    . Cervical cerclage  09/08/2011    Procedure: CERCLAGE CERVICAL;  Surgeon: Tara ArmsLuther H Eure, MD;  Location: [facility];  Service: Gynecology;  Laterality: N/A;  Tara Mahoney,CST-scrubbed; doctor scrubbed in @ [phone]  . Supracervical abdominal hysterectomy  01/26/2012    Procedure: HYSTERECTOMY SUPRACERVICAL ABDOMINAL;  Surgeon: Tara ArmsLuther H Eure, MD;  Location: [facility];  Service: Gynecology;;  converted [phone]  . Laparoscopy  08/09/2012    Procedure: LAPAROSCOPY DIAGNOSTIC;  Surgeon: Tara ArmsLuther H Eure, MD;  Location: [facility];  Service: Gynecology;  Laterality: N/A;  started at  1208-Vaginal trachelectomy (vaginal removal of cervical stump)   .  Laparoscopic lysis of adhesions  08/09/2012    Procedure: LAPAROSCOPIC LYSIS OF ADHESIONS;  Surgeon: Tara Arms, MD;  Location: [facility];  Service: Gynecology;  Laterality: N/A;  . Abdominal hysterectomy       Allergies  Allergen Reactions  . Cardizem Cd [Diltiazem Hcl Er Beads]     rash  . Hydrocodone-Acetaminophen Itching and Nausea And Vomiting  . Metoprolol     Headaches, dizzyness  . Morphine And Related     nausea  . Verapamil     Racing heart, dizzyness, headache,shaking all over her body       Family History  Problem Relation Age of Onset  . Hypertension Brother   . Heart disease Brother      hole in heart  . Anesthesia problems Neg Hx   . Hypotension Neg Hx   . Pseudochol deficiency Neg Hx   . Malignant hyperthermia Neg Hx   . Other Neg Hx   . Ovarian cancer Mother      Social History [name] reports that she quit smoking about 6 years ago. Her smoking use included Cigarettes. She started smoking about 14 years ago. She quit after 1 year of use. She has never used smokeless tobacco. [name] reports that she does not drink alcohol.   Review of Systems CONSTITUTIONAL: No weight loss, fever, chills, weakness or fatigue.  HEENT: Eyes: No visual loss, blurred vision, double vision or yellow sclerae.No hearing loss, sneezing, congestion, runny nose or sore throat.  SKIN: No rash or itching.  CARDIOVASCULAR: per HPI RESPIRATORY: No shortness of breath, cough or sputum.  GASTROINTESTINAL: No anorexia, nausea, vomiting or diarrhea. No abdominal pain or blood.  GENITOURINARY: No burning on urination, no polyuria NEUROLOGICAL: dizziness MUSCULOSKELETAL: No muscle, back pain, joint pain or stiffness.  LYMPHATICS: No enlarged nodes. No history of splenectomy.  PSYCHIATRIC: No history of depression or anxiety.  ENDOCRINOLOGIC: No reports of sweating, cold or heat intolerance. No polyuria or polydipsia.  Marland Kitchen   Physical Examination Filed Vitals:   01/05/16 1339  BP: 106/64  Pulse: 105   Filed Vitals:   01/05/16 1339  Height: 5\' 3"  (1.6 m)  Weight: 126 lb (57.153 kg)    Gen: resting comfortably, no acute distress HEENT: no scleral icterus, pupils equal round and reactive, no palptable cervical adenopathy,  CV: RRR, no m/r/g, no jvd Resp: Clear to auscultation bilaterally GI: abdomen is soft, non-tender, non-distended, normal bowel sounds, no hepatosplenomegaly MSK: extremities are warm, no edema.  Skin: warm, no rash Neuro:  no focal deficits Psych: appropriate affect   Diagnostic Studies 01/2014 Stress echo Study Conclusions  - Stress ECG conclusions: The  stress ECG was normal. Duke treadmill score of 10, consistent with low risk for major cardiac events. - Staged echo: Normal echo stress - From notes patient is on flecanide. There is no QRS widening with exercise.  Impressions:  - Normal study after maximal exercise.   03/2013 echo Study Conclusions  - Study data: Technically adequate study - Left ventricle: The cavity size was normal. Wall thickness was normal. Systolic function was normal. The estimated ejection fraction was in the range of 55% to 60%. Wall motion was normal; there were no regional wall motion abnormalities. Left ventricular diastolic function parameters were normal. - Aortic valve: Valve area: 1.7cm^2(VTI). Valve area: 1.65cm^2 (Vmax). - Mitral valve: Prominent somewhat redundant anterior leaflet. Smaller more dimunitive posterior leaflet. Mild regurgitation. The MR vena contracta is 0.3 cm. The jet is eccentric and posteriorly directed. -  Left atrium: The atrium was normal in size.  05/2013 GXT No ischemic changes, no QRS prolongation    04/2013 Holter monitor 6 beat run of NSVT that was symptomatic. Other symptoms correlated with NSR/  05/2013 Holter No significant arrhythmias   05/2014 7 day monitor -symptoms correlate with NSR and sinus tach.   09/2014 Cardiac CT IMPRESSION: 1. Coronary calcium score of 0.5. This was 31 percentile for age and sex matched control.  2. Normal coronary origin. Right dominance.  3. Minimal calcified plaque in the proximal RCA, otherwise normal coronaries.  4. Present PFO.  Tobias Alexander    Assessment and Plan   1. Palpitations - initial monitor showed PVCs and NSVT, she has been on flecanide. Repeat monitors since that time have not showed any recurrent arrhythmias. Palpitations are stable, continues to have dizziness that appears to be more related to autonomic insufficiency as opposed to symptomatic arrhythmia - we will  start florinef 0.1mg  dialy.   2. Chest pain - negative cardiac CT and stress testing recently, no recent symptoms - continue to monitor   3. Autonomic insufficiency - previously orthostatic in clinc, BP 120/78 p 74 with lying, standing bp 104/76 and HR 100 - continue midodrine, aggressive oral hydration and fluid intake, compression stockings - we wills start florinef 0.1mg  daily.   4. Hyperlipideima - does not appear to require statin at this time, will not restart crestor. Continue to monitor.      Antoine Poche, M.D.

## 2016-01-12 ENCOUNTER — Encounter (HOSPITAL_COMMUNITY): Payer: Self-pay | Admitting: Emergency Medicine

## 2016-01-12 ENCOUNTER — Emergency Department (HOSPITAL_COMMUNITY)
Admission: EM | Admit: 2016-01-12 | Discharge: 2016-01-12 | Disposition: A | Discharge status: Home / Self Care | Payer: Medicaid Other | Attending: Emergency Medicine | Admitting: Emergency Medicine

## 2016-01-12 ENCOUNTER — Emergency Department (HOSPITAL_COMMUNITY): Payer: Medicaid Other

## 2016-01-12 DIAGNOSIS — R002 Palpitations: Secondary | ICD-10-CM | POA: Insufficient documentation

## 2016-01-12 DIAGNOSIS — R0602 Shortness of breath: Secondary | ICD-10-CM | POA: Insufficient documentation

## 2016-01-12 DIAGNOSIS — Z87891 Personal history of nicotine dependence: Secondary | ICD-10-CM | POA: Insufficient documentation

## 2016-01-12 DIAGNOSIS — H539 Unspecified visual disturbance: Secondary | ICD-10-CM | POA: Insufficient documentation

## 2016-01-12 DIAGNOSIS — E785 Hyperlipidemia, unspecified: Secondary | ICD-10-CM | POA: Diagnosis not present

## 2016-01-12 LAB — BASIC METABOLIC PANEL
Anion gap: 6 (ref 5–15)
BUN: 18 mg/dL (ref 6–20)
CHLORIDE: 104 mmol/L (ref 101–111)
CO2: 27 mmol/L (ref 22–32)
Calcium: 9.5 mg/dL (ref 8.9–10.3)
Creatinine, Ser: 0.93 mg/dL (ref 0.44–1.00)
Glucose, Bld: 114 mg/dL — ABNORMAL HIGH (ref 65–99)
POTASSIUM: 3.2 mmol/L — AB (ref 3.5–5.1)
SODIUM: 137 mmol/L (ref 135–145)

## 2016-01-12 LAB — CBC WITH DIFFERENTIAL/PLATELET
BASOS ABS: 0 10*3/uL (ref 0.0–0.1)
BASOS PCT: 0 %
EOS ABS: 0.5 10*3/uL (ref 0.0–0.7)
Eosinophils Relative: 10 %
HCT: 42 % (ref 36.0–46.0)
HEMOGLOBIN: 15.1 g/dL — AB (ref 12.0–15.0)
LYMPHS ABS: 2.5 10*3/uL (ref 0.7–4.0)
Lymphocytes Relative: 44 %
MCH: 31.9 pg (ref 26.0–34.0)
MCHC: 36 g/dL (ref 30.0–36.0)
MCV: 88.6 fL (ref 78.0–100.0)
Monocytes Absolute: 0.3 10*3/uL (ref 0.1–1.0)
Monocytes Relative: 6 %
NEUTROS PCT: 40 %
Neutro Abs: 2.2 10*3/uL (ref 1.7–7.7)
Platelets: 121 10*3/uL — ABNORMAL LOW (ref 150–400)
RBC: 4.74 MIL/uL (ref 3.87–5.11)
RDW: 12.5 % (ref 11.5–15.5)
WBC: 5.6 10*3/uL (ref 4.0–10.5)

## 2016-01-12 NOTE — ED Provider Notes (Signed)
CSN: [id number]     Arrival date & time 01/12/16  2108 History  By signing my name below, I, Tara Mahoney, attest that this documentation has been prepared under the direction and in the presence of Tara MuldersScott Tyronica Truxillo, MD . Electronically Signed: Majel HomerPeyton Mahoney, Scribe. 01/12/2016. 10:06 PM   Chief Complaint  Patient presents with  . Palpitations  . Shortness of Breath   Patient is a 30 y.o. female presenting with palpitations. The history is provided by the patient. No language interpreter was used.  Palpitations Palpitations quality:  Fast Onset quality:  Gradual Duration:  1 day Timing:  Intermittent Progression:  Improving Chronicity:  Chronic Relieved by:  Nothing Worsened by:  Nothing Associated symptoms: chest pain and shortness of breath   Associated symptoms: no back pain, no cough, no nausea and no vomiting    HPI Comments: Tara MarcusCheryl Mahoney is a 30 y.o. female with PMHx of palpitations and heart murmur, who presents to the Emergency Department complaining of gradually improving, intermittent, palpitations and "heart racing" that began earlier today. Pt states associated symptoms of intermittent chest pain alongside palpitations and shortness of breath that also began today. Pt reports she takes flecainide and midodrine for her arrhythmia; though she states she took her medication today with no relief. She states she is currently followed by Dr. Wyline MoodBranch and Dr. Ladona Ridgelaylor in cardiology. Pt denies any other complaints.   Past Medical History  Diagnosis Date  . MVP (mitral valve prolapse)   . Palpitations   . Hyperlipidemia   . Headache(784.0)   . Heart murmur   . Complication of anesthesia   . PONV (postoperative nausea and vomiting)   . Dysrhythmia     sinus arrythmia  . Family history of anesthesia complication     PONV  . V tach (HCC)   . Blood transfusion without reported diagnosis    Past Surgical History  Procedure Laterality Date  . Cervical cerclage  2008  . Myringotomy    .  Cervical cerclage  09/08/2011    Procedure: CERCLAGE CERVICAL;  Surgeon: Tara ArmsLuther H Eure, MD;  Location: [facility];  Service: Gynecology;  Laterality: N/A;  Tara Mahoney,CST-scrubbed; doctor scrubbed in @ [phone]  . Supracervical abdominal hysterectomy  01/26/2012    Procedure: HYSTERECTOMY SUPRACERVICAL ABDOMINAL;  Surgeon: Tara ArmsLuther H Eure, MD;  Location: [facility];  Service: Gynecology;;  converted [phone]  . Laparoscopy  08/09/2012    Procedure: LAPAROSCOPY DIAGNOSTIC;  Surgeon: Tara ArmsLuther H Eure, MD;  Location: [facility];  Service: Gynecology;  Laterality: N/A;  started at  1208-Vaginal trachelectomy (vaginal removal of cervical stump)   . Laparoscopic lysis of adhesions  08/09/2012    Procedure: LAPAROSCOPIC LYSIS OF ADHESIONS;  Surgeon: Tara ArmsLuther H Eure, MD;  Location: [facility];  Service: Gynecology;  Laterality: N/A;  . Abdominal hysterectomy     Family History  Problem Relation Age of Onset  . Hypertension Brother   . Heart disease Brother     hole in heart  . Anesthesia problems Neg Hx   . Hypotension Neg Hx   . Pseudochol deficiency Neg Hx   . Malignant hyperthermia Neg Hx   . Other Neg Hx   . Ovarian cancer Mother    Social History  Substance Use Topics  . Smoking status: Former Smoker -- 1 years    Types: Cigarettes    Start date: 10/01/2001    Quit date: 09/01/2009  . Smokeless tobacco: Never Used     Comment: smokes 1 pack a week  .  Alcohol Use: No   OB History    Gravida Para Term Preterm AB TAB SAB Ectopic Multiple Living   Review of Systems  Constitutional: Negative for fever and chills.  HENT: Negative for rhinorrhea and sore throat.   Eyes: Positive for visual disturbance.  Respiratory: Positive for shortness of breath. Negative for cough.   Cardiovascular: Positive for chest pain and palpitations. Negative for leg swelling.  Gastrointestinal: Negative for nausea, vomiting, abdominal pain and diarrhea.  Genitourinary: Negative for dysuria.  Musculoskeletal:  Negative for back pain.  Skin: Negative for rash.  Neurological: Negative for headaches.  Hematological: Does not bruise/bleed easily.  Psychiatric/Behavioral: Negative for confusion.   Allergies  Cardizem cd; Hydrocodone-acetaminophen; Metoprolol; Morphine and related; and Verapamil  Home Medications   Prior to Admission medications   Medication Sig Start Date End Date Taking? Authorizing Provider  flecainide (TAMBOCOR) 150 MG tablet TAKE ONE TABLET BY MOUTH TWICE A DAY 12/19/15   Tara Poche, MD  fludrocortisone (FLORINEF) 0.1 MG tablet Take 1 tablet (0.1 mg total) by mouth daily. 01/05/16   Tara Poche, MD  midodrine (PROAMATINE) 10 MG tablet Take 1 tablet (10 mg total) by mouth 3 (three) times daily. 09/17/15   Tara Poche, MD   Triage Vitals: BP 148/80 mmHg  Pulse 110  Temp(Src) 98.1 F (36.7 C) (Oral)  Resp 24  Ht  (1.6 m)  Wt 126 lb (57.153 kg)  BMI 22.33 kg/m2  SpO2 100%  LMP 05/04/2011 Physical Exam  Constitutional: She is oriented to person, place, and time. She appears well-developed and well-nourished.  HENT:  Head: Normocephalic and atraumatic.  Moist mucous membranes  Eyes: Conjunctivae are normal. Pupils are equal, round, and reactive to light. Right eye exhibits no discharge. Left eye exhibits no discharge. No scleral icterus.  Neck: Normal range of motion. Neck supple. No tracheal deviation present.  Cardiovascular: Normal rate, regular rhythm and normal heart sounds.   No murmur heard. Pulmonary/Chest: Effort normal and breath sounds normal.  Lungs clear bilaterally  Abdominal: Soft. She exhibits no distension. There is no tenderness. There is no guarding.  Musculoskeletal: She exhibits no edema.  No swelling in ankles  Neurological: She is alert and oriented to person, place, and time.  Skin: Skin is warm. No rash noted.  Psychiatric: She has a normal mood and affect.  Nursing note and vitals reviewed.  ED Course  Procedures   DIAGNOSTIC STUDIES:  Oxygen Saturation is 97% on RA, normal by my interpretation.    COORDINATION OF CARE:  9:55 PM Discussed treatment plan with pt at bedside and pt agreed to plan.  Labs Review Labs Reviewed  CBC WITH DIFFERENTIAL/PLATELET - Abnormal; Notable for the following:    Hemoglobin 15.1 (*)    Platelets 121 (*)    All other components within normal limits  BASIC METABOLIC PANEL - Abnormal; Notable for the following:    Potassium 3.2 (*)    Glucose, Bld 114 (*)    All other components within normal limits    Imaging Review Dg Chest 2 View  01/12/2016  CLINICAL DATA:  Shortness of breath, heart fluttering today. EXAM: CHEST  2 VIEW COMPARISON:  Chest x-ray dated 01/16/2015. FINDINGS: Heart size is normal. Overall cardiomediastinal silhouette is stable in size and configuration. Lungs are clear. No pleural effusion or pneumothorax seen. Osseous and soft tissue structures about the chest are unremarkable. IMPRESSION: No active  cardiopulmonary disease. Electronically Signed   By: Bary RichardStan  Maynard M.D.   On: 01/12/2016 22:00   I have personally reviewed and evaluated these images and lab results as part of my medical decision-making.   EKG Interpretation None      ED ECG REPORT   Date: 01/12/2016  Rate: 100  Rhythm: normal sinus rhythm  QRS Axis: normal  Intervals: QT prolonged  ST/T Wave abnormalities: normal  Conduction Disutrbances:none  Narrative Interpretation:   Old EKG Reviewed: none available  I have personally reviewed the EKG tracing and agree with the computerized printout as noted.     MDM   Final diagnoses:  Palpitations    Patient with history of palpitations arrhythmia followed by cardiology here in [place]. Patient states that she's had palpitations with fast heart rate on and off today. Patient's workup here without any acute findings. Also mentioned shortness of breath oxygen saturation are in the upper 90s on room air. No leg swelling.  No concern for pulmonary embolus. Heart rate currently on the monitor is 88. No fevers. Patient stable for discharge home and follow-up with cardiology here.  I personally performed the services described in this documentation, which was scribed in my presence. The recorded information has been reviewed and is accurate.      Tara MuldersScott Sebert Stollings, MD 01/12/16 2210

## 2016-01-12 NOTE — ED Notes (Signed)
Pt states she feels like her heart is "racing" and that she feels short of breath. Pt states she takes 3 heart medications for this.

## 2016-01-12 NOTE — ED Notes (Signed)
From Xray 

## 2016-01-12 NOTE — Discharge Instructions (Signed)
Return for palpitations lasting 40 minutes or longer nonstop. Otherwise follow-up with the cardiology here call for follow-up. Today's workup without any significant findings. No arrhythmias here.

## 2016-01-13 ENCOUNTER — Ambulatory Visit (INDEPENDENT_AMBULATORY_CARE_PROVIDER_SITE_OTHER): Payer: Medicaid Other | Admitting: Adult Health

## 2016-01-13 ENCOUNTER — Telehealth: Payer: Self-pay | Admitting: Cardiology

## 2016-01-13 ENCOUNTER — Encounter: Payer: Self-pay | Admitting: Adult Health

## 2016-01-13 ENCOUNTER — Ambulatory Visit (INDEPENDENT_AMBULATORY_CARE_PROVIDER_SITE_OTHER): Payer: Medicaid Other

## 2016-01-13 VITALS — BP 120/78 | HR 101 | Ht 63.0 in | Wt 126.0 lb

## 2016-01-13 DIAGNOSIS — R000 Tachycardia, unspecified: Secondary | ICD-10-CM

## 2016-01-13 DIAGNOSIS — G909 Disorder of the autonomic nervous system, unspecified: Secondary | ICD-10-CM | POA: Diagnosis not present

## 2016-01-13 MED ORDER — FLUDROCORTISONE ACETATE 0.1 MG PO TABS: 0.2000 mg | ORAL_TABLET | Freq: Every day | ORAL | Status: DC

## 2016-01-13 NOTE — Patient Instructions (Signed)
Your physician recommends that you schedule a follow-up appointment in: 1 Month with Dr. Wyline MoodBranch.   Your physician has recommended you make the following change in your medication:   Increase Florinef to 0.2 mg Daily   Your physician has recommended that you wear an event monitor. Event monitors are medical devices that record the heart's electrical activity. Doctors most often us these monitors to diagnose arrhythmias. Arrhythmias are problems with the speed or rhythm of the heartbeat. The monitor is a small, portable device. You can wear one while you do your normal daily activities. This is usually used to diagnose what is causing palpitations/syncope (passing out).  If you need a refill on your cardiac medications before your next appointment, please call your pharmacy.  Thank you for choosing [facility]!

## 2016-01-13 NOTE — Telephone Encounter (Signed)
Pt was [facility] on Mon 6/26, states she's having a racing heart rate and that it woke her up this morning hurting, scheduled her  f/u w/ Ronie Spiesayna Dunn on Wed 6/28

## 2016-01-13 NOTE — Progress Notes (Signed)
Cardiology Office Note   Date:  01/13/2016   ID:  Tara Mahoney, DOB [birth year], MRN [id number]  PCP:  Tara Stalling, MD  Cardiologist: Tara Calix, NP   No chief complaint on file.     History of Present Illness: Tara Mahoney is a 30 y.o. female who presents for post-ER evaluation where she was seen on 01/12/2016 for rapid heart rhythm. The patient was last seen by Dr. Wyline Mahoney on 01/05/2016 with complaints of palpitations lightheadedness and dizziness. She had one episode of syncope after lifting a heavy piece of wood and standing quickly. The patient is on flecainide and Minitran 10 mg 3 times a day. She states well hydrated drinking water and Powerade.she was started on Florinef 0.1 mg daily when last seen by Dr. Wyline Mahoney. 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  She requested appointment sooner than previously scheduled appointment.she stated that she awoke this morning having racing heart and chest discomfort.  She denies having panic attacks, anxiety, and significant stressors in her life. She states that everything is calm, there are no social issues going on. She states that she can be scrolling for frozen or sleep when the rapid heart rhythm comes on. She says it causes her to be sore. She does not drink any type of caffeine, smoke, or use any type of stimulants. Her TSH has been checked in the past has been normal.  Past Medical History  Diagnosis Date  . MVP (mitral valve prolapse)   . Palpitations   . Hyperlipidemia   . Headache(784.0)   . Heart murmur   . Complication of anesthesia   . PONV (postoperative nausea and vomiting)   . Dysrhythmia     sinus arrythmia  . Family history of anesthesia complication     PONV  . V tach (HCC)   . Blood transfusion without reported diagnosis     Past Surgical History  Procedure Laterality Date  . Cervical cerclage  2008  . Myringotomy    .  Cervical cerclage  09/08/2011    Procedure: CERCLAGE CERVICAL;  Surgeon: Lazaro Arms, MD;  Location: [facility];  Service: Gynecology;  Laterality: N/A;  [name],CST-scrubbed; doctor scrubbed in @ [phone]  . Supracervical abdominal hysterectomy  01/26/2012    Procedure: HYSTERECTOMY SUPRACERVICAL ABDOMINAL;  Surgeon: Lazaro Arms, MD;  Location: [facility];  Service: Gynecology;;  converted [phone]  . Laparoscopy  08/09/2012    Procedure: LAPAROSCOPY DIAGNOSTIC;  Surgeon: Lazaro Arms, MD;  Location: [facility];  Service: Gynecology;  Laterality: N/A;  started at  1208-Vaginal trachelectomy (vaginal removal of cervical stump)   . Laparoscopic lysis of adhesions  08/09/2012    Procedure: LAPAROSCOPIC LYSIS OF ADHESIONS;  Surgeon: Lazaro Arms, MD;  Location: [facility];  Service: Gynecology;  Laterality: N/A;  . Abdominal hysterectomy       Current Outpatient Prescriptions  Medication Sig Dispense Refill  . flecainide (TAMBOCOR) 150 MG tablet TAKE ONE TABLET BY MOUTH TWICE A DAY 60 tablet 6  . fludrocortisone (FLORINEF) 0.1 MG tablet Take 1 tablet (0.1 mg total) by mouth daily. 30 tablet 3  . midodrine (PROAMATINE) 10 MG tablet Take 1 tablet (10 mg total) by mouth 3 (three) times daily. 60 tablet 3   No current facility-administered medications for this visit.    Allergies:   Cardizem cd; Hydrocodone-acetaminophen; Metoprolol; Morphine and related; and Verapamil    Social History:  The patient  reports that she quit smoking about 6 years ago. Her smoking use included Cigarettes. She started smoking about 14 years ago. She quit after 1 year of use. She has never used smokeless tobacco. She reports that she does not drink alcohol or use illicit drugs.   Family History:  The patient's family history includes Heart disease in her brother; Hypertension in her brother; Ovarian cancer in her mother. There is no history of Anesthesia problems, Hypotension, Pseudochol deficiency, Malignant hyperthermia, or  Other.    ROS: All other systems are reviewed and negative. Unless otherwise mentioned in H&P    PHYSICAL EXAM: VS:  BP 120/78 mmHg  Pulse 101  Ht 5\' 3"  (1.6 m)  Wt 126 lb (57.153 kg)  BMI 22.33 kg/m2  SpO2 99%  LMP 05/04/2011 , BMI Body mass index is 22.33 kg/(m^2). GEN: Well nourished, well developed, in no acute distress HEENT: normal Neck: no JVD, carotid bruits, or masses Cardiac: RRR;tachycardic, no murmurs, rubs, or gallops,no edema  Respiratory:  clear to auscultation bilaterally, normal work of breathing GI: soft, nontender, nondistended, + BS MS: no deformity or atrophy Skin: warm and dry, no rash Neuro:  Strength and sensation are intact Psych: euthymic Mahoney, full affect  Recent Labs: 09/17/2015: ALT 9; Magnesium 1.7; TSH 1.15 01/12/2016: BUN 18; Creatinine, Ser 0.93; Hemoglobin 15.1*; Platelets 121*; Potassium 3.2*; Sodium 137    Lipid Panel    Component Value Date/Time   CHOL 119* 09/17/2015 0856   TRIG 130 09/17/2015 0856   HDL 31* 09/17/2015 0856   CHOLHDL 3.8 09/17/2015 0856   VLDL 26 09/17/2015 0856   LDLCALC 62 09/17/2015 0856      Wt Readings from Last 3 Encounters:  01/13/16 126 lb (57.153 kg)  01/12/16 126 lb (57.153 kg)  01/05/16 126 lb (57.153 kg)      Other studies Reviewed: Additional studies/ records that were reviewed today include: EKG on 01/13/2015. Review of the above records demonstrates: sinus tachycardia, no evidence of WPW, or AVRNT.    ASSESSMENT AND PLAN:  1.  Tachycardia: She has a history of autonomic dysfunction and has had recent institution of Florinef 0.1 mg daily. She states over the last 3 days she has had increase incidences of rapid heart rhythm. At first taking the medication, and everything down. I will increase her Florinef to 0.2 mg daily. She will continue flecainide 150 mg twice a day which is the maximum dose. We'll place a to a cardiac monitor on her to evaluate her response, and evidence of rapid heart rhythm  and frequency.  2. Questionable anxiety: I discussed with her any stressors or history of panic attacks. She denies any of this. I've asked her about social and family situations and she says that everything is very calm in her home and in her social circle. She may need to see a counselor to discuss this in more in depth as she does not feel that what she is experiencing is related to any psychological issues. This may be something to address on next visit just to rule out any other extenuating circumstances causing her to have these rapid rhythms. She needs to talk with her primary care physician, maybe a dose of Xanax would be helpful during these rapid heart rhythm episodes.   Current medicines are reviewed at length with the patient today.    Labs/ tests ordered today include: Two week cardiac monitor    Disposition:   FU with 1 month   Signed, Joni ReiningKathryn Kenan Moodie, NP  01/13/2016 2:56 PM    [facility] [address], [place], [place] [address] Phone: [phone]; Fax: [phone]

## 2016-01-13 NOTE — Progress Notes (Signed)
Name: Tara MarcusCheryl Mahoney    DOB: 03-24-1986  Age: 30 y.o.  MR#: [id number]       PCP:  Isabella StallingNDIEGO,RICHARD M, MD      Insurance: Payor: MEDICAID [place] / Plan: MEDICAID [place] ACCESS / Product Type: *No Product type* /   CC:   No chief complaint on file.   VS Filed Vitals:   01/13/16 1439  Pulse: 101  Height: 5\' 3"  (1.6 m)  Weight: 126 lb (57.153 kg)  SpO2: 99%    Weights Current Weight  01/13/16 126 lb (57.153 kg)  01/12/16 126 lb (57.153 kg)  01/05/16 126 lb (57.153 kg)    Blood Pressure  BP Readings from Last 3 Encounters:  01/12/16 110/84  01/05/16 106/64  11/26/15 136/89     Admit date:  (Not on file) Last encounter with RMR:  12/19/2015   Allergy Cardizem cd; Hydrocodone-acetaminophen; Metoprolol; Morphine and related; and Verapamil  Current Outpatient Prescriptions  Medication Sig Dispense Refill  . flecainide (TAMBOCOR) 150 MG tablet TAKE ONE TABLET BY MOUTH TWICE A DAY 60 tablet 6  . fludrocortisone (FLORINEF) 0.1 MG tablet Take 1 tablet (0.1 mg total) by mouth daily. 30 tablet 3  . midodrine (PROAMATINE) 10 MG tablet Take 1 tablet (10 mg total) by mouth 3 (three) times daily. 60 tablet 3   No current facility-administered medications for this visit.    Discontinued Meds:   There are no discontinued medications.  Patient Active Problem List   Diagnosis Date Noted  . Autonomic dysfunction 03/20/2015  . Chest pain 02/27/2014  . Sinus tachycardia (HCC) 02/06/2014  . PVC (premature ventricular contraction) 05/09/2013  . PAC (premature atrial contraction) 05/09/2013  . Nonsustained ventricular tachycardia (HCC) 05/09/2013  . Abdominal pain, left lower quadrant 02/20/2013  . 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 PALPITATIONS 09/23/2010    LABS    Component Value Date/Time   NA 137 01/12/2016 2130   NA 138 09/17/2015 0856   NA 137 01/16/2015 1437   K 3.2* 01/12/2016 2130   K 4.2 09/17/2015 0856   K 4.2 01/16/2015 1437   CL 104 01/12/2016 2130   CL 102 09/17/2015  0856   CL 106 01/16/2015 1437   CO2 27 01/12/2016 2130   CO2 27 09/17/2015 0856   CO2 26 01/16/2015 1437   GLUCOSE 114* 01/12/2016 2130   GLUCOSE 95 09/17/2015 0856   GLUCOSE 83 01/16/2015 1437   BUN 18 01/12/2016 2130   BUN 12 09/17/2015 0856   BUN 13 01/16/2015 1437   CREATININE 0.93 01/12/2016 2130   CREATININE 0.95 09/17/2015 0856   CREATININE 0.81 01/16/2015 1437   CREATININE 0.99 02/12/2014 1004   CREATININE 0.86 04/11/2013 1034   CALCIUM 9.5 01/12/2016 2130   CALCIUM 9.6 09/17/2015 0856   CALCIUM 8.5* 01/16/2015 1437   GFRNONAA >60 01/12/2016 2130   GFRNONAA >60 01/16/2015 1437   GFRNONAA 76* 02/12/2014 1004   GFRAA >60 01/12/2016 2130   GFRAA >60 01/16/2015 1437   GFRAA 88* 02/12/2014 1004   CMP     Component Value Date/Time   NA 137 01/12/2016 2130   K 3.2* 01/12/2016 2130   CL 104 01/12/2016 2130   CO2 27 01/12/2016 2130   GLUCOSE 114* 01/12/2016 2130   BUN 18 01/12/2016 2130   CREATININE 0.93 01/12/2016 2130   CREATININE 0.95 09/17/2015 0856   CALCIUM 9.5 01/12/2016 2130   PROT 7.2 09/17/2015 0856   ALBUMIN 4.6 09/17/2015 0856   AST 14 09/17/2015 0856   ALT 9  09/17/2015 0856   ALKPHOS 36 09/17/2015 0856   BILITOT 0.3 09/17/2015 0856   GFRNONAA >60 01/12/2016 2130   GFRAA >60 01/12/2016 2130       Component Value Date/Time   WBC 5.6 01/12/2016 2130   WBC 3.8* 09/17/2015 0856   WBC 5.1 01/16/2015 1437   HGB 15.1* 01/12/2016 2130   HGB 14.5 09/17/2015 0856   HGB 12.7 01/16/2015 1437   HCT 42.0 01/12/2016 2130   HCT 42.7 09/17/2015 0856   HCT 36.9 01/16/2015 1437   MCV 88.6 01/12/2016 2130   MCV 89.5 09/17/2015 0856   MCV 90.0 01/16/2015 1437    Lipid Panel     Component Value Date/Time   CHOL 119* 09/17/2015 0856   TRIG 130 09/17/2015 0856   HDL 31* 09/17/2015 0856   CHOLHDL 3.8 09/17/2015 0856   VLDL 26 09/17/2015 0856   LDLCALC 62 09/17/2015 0856    ABG No results found for: PHART, PCO2ART, PO2ART, HCO3, TCO2, ACIDBASEDEF, O2SAT    Lab Results  Component Value Date   TSH 1.15 09/17/2015   BNP (last 3 results) No results for input(s): BNP in the last 8760 hours.  ProBNP (last 3 results) No results for input(s): PROBNP in the last 8760 hours.  Cardiac Panel (last 3 results) No results for input(s): CKTOTAL, CKMB, TROPONINI, RELINDX in the last 72 hours.  Iron/TIBC/Ferritin/ %Sat No results found for: IRON, TIBC, FERRITIN, IRONPCTSAT   EKG Orders placed or performed during the hospital encounter of 01/12/16  . ED EKG  . ED EKG  . EKG     Prior Assessment and Plan Problem List as of 01/13/2016      Cardiovascular and Mediastinum   Mitral valve disorder   PVC (premature ventricular contraction)   Last Assessment & Plan 03/20/2015 Office Visit Written 03/20/2015 11:41 AM by Marinus MawGregg W Taylor, MD    Her symptoms are well controlled with flecainide. She will continue her current meds.      PAC (premature atrial contraction)   Nonsustained ventricular tachycardia [facility](HCC)   Last Assessment & Plan 05/28/2014 Office Visit Edited 05/28/2014  1:38 PM by Jodelle GrossKathryn M Lawrence, NP    I have reviewed the cardiac monitor telemetry strips with the patient and collaborated with her concerning her activity around the time of the elevated HR. She states that she was not stressed at any of the times her HR went up. She is staying off of caffeine.   I will increase flecainide to 150 mg BID from 100 mg BID. I will sent her back to see Dr. Ladona Ridgelaylor to review other options concerning treatment to include possible ablation vs medical management. She is intolerant to BB, CCB with dizziness, headaches. I am not sure all of this is related to PTSD, but could be contributing.   I have given her a copy of her cardiac monitor strips and results.        Nervous and Auditory   Autonomic dysfunction   Last Assessment & Plan 03/20/2015 Office Visit Written 03/20/2015 11:44 AM by Marinus MawGregg W Taylor, MD    Her pressure is ok today but has been low at times.  She will continue midodrine. I instructed her to take extra midodrine if her low blood pressure persists. Also she is instructed to increase her salt intake.        Other   PALPITATIONS   Last Assessment & Plan 09/06/2014 Office Visit Written 09/06/2014 10:59 PM by Marinus MawGregg W Taylor, MD    Her  symptoms remain but are tolerable on flecainide 100 mg twice daily. She will continue this medication. No other changes.       Abdominal pain, left lower quadrant   Sinus tachycardia [facility])   Last Assessment & Plan 05/15/2014 Office Visit Written 05/15/2014  3:03 PM by Jodelle Gross, NP    This is multifactorial with use of caffeine to include SunDrop cola and other drinks, along with anxiety. She continues to take flecainide  BID. Today in office HR is 96 per EKG at rest.   I have placed a cardiac monitor on her to assess her HR for 7 days, as she states her HR does not always go up every day. I will not add medications at this time. She is advised to eliminate caffeine from her diet entirely, as she may be very sensitive to this. Follow up appt after monitor returned.      Chest pain   Last Assessment & Plan 03/20/2015 Office Visit Written 03/20/2015 11:42 AM by Marinus Maw, MD    This appears to be quiet. She will undergo watchful waiting.          Imaging: Dg Chest 2 View  01/12/2016  CLINICAL DATA:  Shortness of breath, heart fluttering today. EXAM: CHEST  2 VIEW COMPARISON:  Chest x-ray dated 01/16/2015. FINDINGS: Heart size is normal. Overall cardiomediastinal silhouette is stable in size and configuration. Lungs are clear. No pleural effusion or pneumothorax seen. Osseous and soft tissue structures about the chest are unremarkable. IMPRESSION: No active cardiopulmonary disease. Electronically Signed   By: Bary Richard M.D.   On: 01/12/2016 22:00

## 2016-01-14 ENCOUNTER — Encounter: Payer: Medicaid Other | Admitting: Physician Assistant

## 2016-02-04 NOTE — ED Provider Notes (Signed)
Medical screening examination/treatment/procedure(s) were performed by non-physician practitioner and as supervising physician I was immediately available for consultation/collaboration.   EKG Interpretation None       Sandy Haye, MD 02/04/16 1604 

## 2016-02-05 ENCOUNTER — Ambulatory Visit (INDEPENDENT_AMBULATORY_CARE_PROVIDER_SITE_OTHER): Payer: Medicaid Other | Admitting: Cardiology

## 2016-02-05 ENCOUNTER — Encounter: Payer: Self-pay | Admitting: Cardiology

## 2016-02-05 VITALS — BP 124/80 | HR 93 | Ht 63.0 in | Wt 128.0 lb

## 2016-02-05 DIAGNOSIS — R002 Palpitations: Secondary | ICD-10-CM

## 2016-02-05 DIAGNOSIS — R0789 Other chest pain: Secondary | ICD-10-CM

## 2016-02-05 DIAGNOSIS — G909 Disorder of the autonomic nervous system, unspecified: Secondary | ICD-10-CM | POA: Diagnosis not present

## 2016-02-05 NOTE — Patient Instructions (Signed)
Medication Instructions:  Your physician recommends that you continue on your current medications as directed. Please refer to the Current Medication list given to you today.   Labwork: NONE  Testing/Procedures: NONE  Follow-Up: Your physician recommends that you schedule a follow-up appointment in: TO BE DETERMINED    Any Other Special Instructions Will Be Listed Below (If Applicable).  I HAVE SENT IN A REFERRAL TO DR. Graciela HusbandsKLEIN, SOMEONE FROM THEIR OFFICE WILL CONTACT YOU SOON   If you need a refill on your cardiac medications before your next appointment, please call your pharmacy.

## 2016-02-05 NOTE — Progress Notes (Signed)
Clinical Summary [name] is a 30 y.o.female seen today for follow up of the following medical problems.   1. 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   2. Chest pain - coronary CT with overall normal coronaries - stress echo 01/2014 Duke treadmill score of 10, no ischemia by EKG or imaging  - continue to have intermittent chest pain at times.   3. Autonomic insufficiency - dizziness and palpititations, occur daily mainly with position changes or bending over.  - drinks water x 10-12 glasses, occasional tea, sports drinks x 3. Compliant with midodrine.  - history of partial hysterectomy, medical therapy not limited despite childbearing age.  - symptoms ongoing despite being on midodrine and florinef     SH: husband recent diagnosed with fatty liver, which has caused her some recent stress   Past Medical History  Diagnosis Date  . MVP (mitral valve prolapse)   . Palpitations   . Hyperlipidemia   . Headache(784.0)   . Heart murmur   . Complication of anesthesia   . PONV (postoperative nausea and vomiting)   . Dysrhythmia     sinus arrythmia  . Family history of anesthesia complication     PONV  . V tach (HCC)   . Blood transfusion without reported diagnosis      Allergies  Allergen Reactions  . Cardizem Cd [Diltiazem Hcl Er Beads]     rash  . Hydrocodone-Acetaminophen Itching and Nausea And Vomiting  . Metoprolol     Headaches, dizzyness  . Morphine And Related     nausea  . Verapamil     Racing heart, dizzyness, headache,shaking all over her body      Current Outpatient Prescriptions  Medication Sig Dispense Refill  . flecainide (TAMBOCOR) 150 MG tablet TAKE ONE TABLET BY MOUTH TWICE A DAY 60 tablet 6  . fludrocortisone (FLORINEF)  0.1 MG tablet Take 2 tablets (0.2 mg total) by mouth daily. 30 tablet 6  . midodrine (PROAMATINE) 10 MG tablet Take 1 tablet (10 mg total) by mouth 3 (three) times daily. 60 tablet 3   No current facility-administered medications for this visit.     Past Surgical History  Procedure Laterality Date  . Cervical cerclage  2008  . Myringotomy    . Cervical cerclage  09/08/2011    Procedure: CERCLAGE CERVICAL;  Surgeon: Lazaro Arms, MD;  Location: [facility];  Service: Gynecology;  Laterality: N/A;  Tara Mahoney,CST-scrubbed; doctor scrubbed in @ [phone]  . Supracervical abdominal hysterectomy  01/26/2012    Procedure: HYSTERECTOMY SUPRACERVICAL ABDOMINAL;  Surgeon: Lazaro Arms, MD;  Location: [facility];  Service: Gynecology;;  converted [phone]  . Laparoscopy  08/09/2012    Procedure: LAPAROSCOPY DIAGNOSTIC;  Surgeon: Lazaro Arms, MD;  Location: [facility];  Service: Gynecology;  Laterality: N/A;  started at  1208-Vaginal trachelectomy (vaginal removal of cervical stump)   . Laparoscopic lysis of adhesions  08/09/2012    Procedure: LAPAROSCOPIC LYSIS OF ADHESIONS;  Surgeon: Lazaro Arms, MD;  Location: [facility];  Service: Gynecology;  Laterality: N/A;  . Abdominal hysterectomy       Allergies  Allergen Reactions  . Cardizem Cd [Diltiazem Hcl Er Beads]     rash  . Hydrocodone-Acetaminophen Itching and Nausea And Vomiting  .  Metoprolol     Headaches, dizzyness  . Morphine And Related     nausea  . Verapamil     Racing heart, dizzyness, headache,shaking all over her body       Family History  Problem Relation Age of Onset  . Hypertension Brother   . Heart disease Brother     hole in heart  . Anesthesia problems Neg Hx   . Hypotension Neg Hx   . Pseudochol deficiency Neg Hx   . Malignant hyperthermia Neg Hx   . Other Neg Hx   . Ovarian cancer Mother      Social History [name] reports that she quit smoking about 6 years ago. Her smoking use included Cigarettes. She started  smoking about 14 years ago. She quit after 1 year of use. She has never used smokeless tobacco. [name] reports that she does not drink alcohol.   Review of Systems CONSTITUTIONAL: No weight loss, fever, chills, weakness or fatigue.  HEENT: Eyes: No visual loss, blurred vision, double vision or yellow sclerae.No hearing loss, sneezing, congestion, runny nose or sore throat.  SKIN: No rash or itching.  CARDIOVASCULAR: per HPI RESPIRATORY: No shortness of breath, cough or sputum.  GASTROINTESTINAL: No anorexia, nausea, vomiting or diarrhea. No abdominal pain or blood.  GENITOURINARY: No burning on urination, no polyuria NEUROLOGICAL: No headache, dizziness, syncope, paralysis, ataxia, numbness or tingling in the extremities. No change in bowel or bladder control.  MUSCULOSKELETAL: No muscle, back pain, joint pain or stiffness.  LYMPHATICS: No enlarged nodes. No history of splenectomy.  PSYCHIATRIC: No history of depression or anxiety.  ENDOCRINOLOGIC: No reports of sweating, cold or heat intolerance. No polyuria or polydipsia.  Marland Kitchen   Physical Examination Filed Vitals:   02/05/16 1506  BP: 124/80  Pulse: 93   Filed Vitals:   02/05/16 1506  Height:  (1.6 m)  Weight: 128 lb (58.06 kg)    Gen: resting comfortably, no acute distress HEENT: no scleral icterus, pupils equal round and reactive, no palptable cervical adenopathy,  CV Resp: Clear to auscultation bilaterally GI: abdomen is soft, non-tender, non-distended, normal bowel sounds, no hepatosplenomegaly MSK: extremities are warm, no edema.  Skin: warm, no rash Neuro:  no focal deficits Psych: appropriate affect   Diagnostic Studies 01/2014 Stress echo Study Conclusions  - Stress ECG conclusions: The stress ECG was normal. Duke treadmill score of 10, consistent with low risk for major cardiac events. - Staged echo: Normal echo stress - From notes patient is on flecanide. There is no QRS widening  with exercise.  Impressions:  - Normal study after maximal exercise.   03/2013 echo Study Conclusions  - Study data: Technically adequate study - Left ventricle: The cavity size was normal. Wall thickness was normal. Systolic function was normal. The estimated ejection fraction was in the range of 55% to 60%. Wall motion was normal; there were no regional wall motion abnormalities. Left ventricular diastolic function parameters were normal. - Aortic valve: Valve area: 1.7cm^2(VTI). Valve area: 1.65cm^2 (Vmax). - Mitral valve: Prominent somewhat redundant anterior leaflet. Smaller more dimunitive posterior leaflet. Mild regurgitation. The MR vena contracta is 0.3 cm. The jet is eccentric and posteriorly directed. - Left atrium: The atrium was normal in size.  05/2013 GXT No ischemic changes, no QRS prolongation    04/2013 Holter monitor 6 beat run of NSVT that was symptomatic. Other symptoms correlated with NSR/  05/2013 Holter No significant arrhythmias   05/2014 7 day monitor -symptoms correlate with NSR  and sinus tach.   09/2014 Cardiac CT IMPRESSION: 1. Coronary calcium score of 0.5. This was 10334 percentile for age and sex matched control.  2. Normal coronary origin. Right dominance.  3. Minimal calcified plaque in the proximal RCA, otherwise normal coronaries.  4. Present PFO.  Tobias AlexanderKatarina Nelson   12/2015 Event monitor Telemetry tracings show sinus rhythm throughout study  Symptoms correlate with normal sinus rhtythm and sinus tachycardia  No significant arrhythmias   Assessment and Plan  1. Palpitations - initial monitor showed PVCs and NSVT, she has been on flecanide. Repeat monitors since that time have not showed any recurrent arrhythmias. Ongoing palpitations, dizziness, chest pain that is primarily positional. Not improved with midodrine and florinef - we will refer to Dr Graciela [place]Klein in [place] to see if any other  therapeutic options.   2. Chest pain - negative cardiac CT and stress testing recently, no recent symptoms - we will continue to monitor   3. Autonomic insufficiency - previously orthostatic in clinc, BP 120/78 p 74 with lying, standing bp 104/76 and HR 100 - symptoms have not improved with midodrine and florinef - refer to EP Dr Graciela HusbandsKlein        Antoine PocheJonathan F. Hasheem Voland, M.D.

## 2016-02-18 ENCOUNTER — Encounter: Payer: Self-pay | Admitting: Internal Medicine

## 2016-02-19 ENCOUNTER — Encounter: Payer: Self-pay | Admitting: Internal Medicine

## 2016-02-19 ENCOUNTER — Ambulatory Visit (INDEPENDENT_AMBULATORY_CARE_PROVIDER_SITE_OTHER): Payer: Medicaid Other | Admitting: Internal Medicine

## 2016-02-19 VITALS — BP 127/84 | HR 83 | Ht 63.0 in | Wt 126.8 lb

## 2016-02-19 DIAGNOSIS — G909 Disorder of the autonomic nervous system, unspecified: Secondary | ICD-10-CM

## 2016-02-19 DIAGNOSIS — I493 Ventricular premature depolarization: Secondary | ICD-10-CM

## 2016-02-19 DIAGNOSIS — R002 Palpitations: Secondary | ICD-10-CM

## 2016-02-19 DIAGNOSIS — I472 Ventricular tachycardia, unspecified: Secondary | ICD-10-CM

## 2016-02-19 MED ORDER — DIAZEPAM 5 MG PO TABS
ORAL_TABLET | ORAL | 0 refills | Status: DC
Start: 2016-02-19 — End: 2016-04-28

## 2016-02-19 MED ORDER — MIDODRINE HCL 5 MG PO TABS: ORAL_TABLET | ORAL | Status: DC

## 2016-02-19 NOTE — Patient Instructions (Signed)
Medication Instructions: - Your physician has recommended you make the following change in your medication:  1) Stop Flecainide 2) Stop Florinef 3) Change Proamitine to 5 mg twice daily (morning & lunch time)  4) Take Therma Tabs two tablets by mouth twice daily  Labwork: - none  Procedures/Testing: - Your physician has requested that you have a cardiac MRI. Cardiac MRI uses a computer to create images of your heart as its beating, producing both still and moving pictures of your heart and major blood vessels. For further information please visit [email or web address]. Please follow the instruction sheet given to you today for more information.  - Your physician recommends that you have a Signal Averaged EKG (Merida Alcantar will call to schedule this once your Cardiac MRI is set- will try to arrange on the same day)  Follow-Up: - Your physician recommends that you schedule a follow-up appointment in: 2-3 months   Any Additional Special Instructions Will Be Listed Below (If Applicable). - Push oral fluids until your urine is clear    If you need a refill on your cardiac medications before your next appointment, please call your pharmacy.

## 2016-02-19 NOTE — Progress Notes (Signed)
ELECTROPHYSIOLOGY CONSULT NOTE  Patient ID: Tara Mahoney, MRN: [id number], DOB/AGE: 1985-11-03 30 y.o. Admit date: (Not on file) Date of Consult: 02/19/2016  Primary Physician: Isabella Stalling, MD Primary Cardiologist: Dorma Russell Consulting Physician JB  Chief Complaint: dIZZINESS AND VT   HPI Tara Mahoney is a 30 y.o. female with a long-standing history of palpitations and dizziness and exercise intolerance.  Holter monitoring over the years that showed some PACs and PVCs and some nonsustained ventricular tachycardia. Multiple episodes of sinus tachycardia have also been identified. Structural cardiac evaluation included an echo and a stress echo which were reported as normal.  She was started on flecainide.  The patient has heat intolerance. She has shower intolerance. She had, prior to her hysterectomy with her children, significant worsening of symptoms around her periods. Lightheadedness exercise intolerance is been frequent. Symptoms are typically worse in the summer. She has had some syncope and presyncope. They're characterized by a prodrome of nausea flushing diaphoresis and characterize the recovery phase by significant residual fatigue.  She's been felt to have some degree of dysautonomia and she is been treated with Florinef and ProAmatine the latter albeit dosing at 8 lunch and bedtime  Her diet is deplete of sodium. She is very averse to sodium. Also deplete of fluid  Some post exercise lightheadedness  No alcohol or drugs  Past Medical History:  Diagnosis Date  . Blood transfusion without reported diagnosis   . Complication of anesthesia   . Dysrhythmia    sinus arrythmia  . Family history of anesthesia complication    PONV  . Headache(784.0)   . Heart murmur   . Hyperlipidemia   . MVP (mitral valve prolapse)   . Palpitations   . PONV (postoperative nausea and vomiting)   . V tach [facility])       Surgical History:  Past Surgical History:  Procedure  Laterality Date  . ABDOMINAL HYSTERECTOMY    . CERVICAL CERCLAGE  2008  . CERVICAL CERCLAGE  09/08/2011   Procedure: CERCLAGE CERVICAL;  Surgeon: Lazaro Arms, MD;  Location: [facility];  Service: Gynecology;  Laterality: N/A;  [name],CST-scrubbed; doctor scrubbed in @ [phone]  . LAPAROSCOPIC LYSIS OF ADHESIONS  08/09/2012   Procedure: LAPAROSCOPIC LYSIS OF ADHESIONS;  Surgeon: Lazaro Arms, MD;  Location: [facility];  Service: Gynecology;  Laterality: N/A;  . LAPAROSCOPY  08/09/2012   Procedure: LAPAROSCOPY DIAGNOSTIC;  Surgeon: Lazaro Arms, MD;  Location: [facility];  Service: Gynecology;  Laterality: N/A;  started at  1208-Vaginal trachelectomy (vaginal removal of cervical stump)   . MYRINGOTOMY    . SUPRACERVICAL ABDOMINAL HYSTERECTOMY  01/26/2012   Procedure: HYSTERECTOMY SUPRACERVICAL ABDOMINAL;  Surgeon: Lazaro Arms, MD;  Location: [facility];  Service: Gynecology;;  converted [phone]     Home Meds: Prior to Admission medications   Medication Sig Start Date End Date Taking? Authorizing Provider  flecainide (TAMBOCOR) 150 MG tablet TAKE ONE TABLET BY MOUTH TWICE A DAY 12/19/15  Yes Antoine Poche, MD  fludrocortisone (FLORINEF) 0.1 MG tablet Take 2 tablets (0.2 mg total) by mouth daily. 01/13/16  Yes Jodelle Gross, NP  midodrine (PROAMATINE) 10 MG tablet Take 1 tablet (10 mg total) by mouth 3 (three) times daily. 09/17/15  Yes Antoine Poche, MD    Allergies:  Allergies  Allergen Reactions  . Cardizem Cd [Diltiazem Hcl Er Beads]     rash  . Hydrocodone-Acetaminophen Itching and Nausea And Vomiting  . Metoprolol  Headaches, dizzyness  . Morphine And Related     nausea  . Verapamil     Racing heart, dizzyness, headache,shaking all over her body     Social History   Social History  . Marital status: Married    Spouse name: N/A  . Number of children: 1  . Years of education: N/A   Occupational History  . unemployed    Social History Main Topics  . Smoking status:  Former Smoker    Years: 1.00    Types: Cigarettes    Start date: 10/01/2001    Quit date: 09/01/2009  . Smokeless tobacco: Never Used     Comment: smokes 1 pack a week  . Alcohol use No  . Drug use: No  . Sexual activity: Yes    Partners: Male    Birth control/ protection: Surgical   Other Topics Concern  . Not on file   Social History Narrative  . No narrative on file     Family History  Problem Relation Age of Onset  . Hypertension Brother   . Heart disease Brother     hole in heart  . Ovarian cancer Mother   . Anesthesia problems Neg Hx   . Hypotension Neg Hx   . Pseudochol deficiency Neg Hx   . Malignant hyperthermia Neg Hx   . Other Neg Hx      ROS:  Please see the history of present illness.     All other systems reviewed and negative.    Physical Exam: Blood pressure 127/84, pulse 83, height 5\' 3"  (1.6 m), weight 126 lb 12.8 oz (57.5 kg), last menstrual period 05/04/2011, SpO2 98 %. General: Well developed, well nourished female in no acute distress. Head: Normocephalic, atraumatic, sclera non-icteric, no xanthomas, nares are without discharge. EENT: normal  Lymph Nodes:  none Neck: Negative for carotid bruits. JVD not elevated. Back:without scoliosis kyphosis  Lungs: Clear bilaterally to auscultation without wheezes, rales, or rhonchi. Breathing is unlabored. Heart: RRR with S1 S2. No   murmur . No rubs, or gallops appreciated. Abdomen: Soft, non-tender, non-distended with normoactive bowel sounds. No hepatomegaly. No rebound/guarding. No obvious abdominal masses. Msk:  Strength and tone appear normal for age. Extremities: No clubbing or cyanosis. No edema.  Distal pedal pulses are 2+ and equal bilaterally. Skin: Warm and Dry Neuro: Alert and oriented X 3. CN III-XII intact Grossly normal sensory and motor function . Psych:  Responds to questions appropriately with a normal affect.      Labs: Cardiac Enzymes No results for input(s): CKTOTAL, CKMB,  TROPONINI in the last 72 hours. CBC Lab Results  Component Value Date   WBC 5.6 01/12/2016   HGB 15.1 (H) 01/12/2016   HCT 42.0 01/12/2016   MCV 88.6 01/12/2016   PLT 121 (L) 01/12/2016   PROTIME: No results for input(s): LABPROT, INR in the last 72 hours. Chemistry No results for input(s): NA, K, CL, CO2, BUN, CREATININE, CALCIUM, PROT, BILITOT, ALKPHOS, ALT, AST, GLUCOSE in the last 168 hours.  Invalid input(s): LABALBU Lipids Lab Results  Component Value Date   CHOL 119 (L) 09/17/2015   HDL 31 (L) 09/17/2015   LDLCALC 62 09/17/2015   TRIG 130 09/17/2015   BNP No results found for: PROBNP Thyroid Function Tests: No results for input(s): TSH, T4TOTAL, T3FREE, THYROIDAB in the last 72 hours.  Invalid input(s): FREET3 Miscellaneous Lab Results  Component Value Date   DDIMER >20.00 (H) 01/26/2012    Radiology/Studies:  No results found.  EKG: sinus  oterwise normal   Assessment and Plan:  Dysautonomia/POTS  Flecainide neurotoxicity  Nonsustained ventricular tachycardia   The patient has symptoms consistent with dysautonomia and objective findings nearly diagnostic of POTS.  We discussed extensively the issues of dysautonomia, the physiology of orthstasis and positional stress.  We discussed the role of salt and water repletion, the importance of exercise, often needing to be started in the recumbent position, and the awareness of triggers and the role of ambient heat and dehydration  We will give her the POTS book and website access  I worry that her chronic dizziness is related to her flecainide. It was started some years ago without clear benefit; we will stop it.  Florinef is most useful in the setting of adequate sodium intake. We will stop it.   I hope she will not need the ProAmatine; we will decrease the dosing from 10--5. We will decrease the dosing from 3 times a day as she is taking her last dose at bedtime and have her take it twice a day with a goal  of weaning off completely.  For sodium supplementation she will take ThermaTabs and she does not tolerate the taste of salt very well. She is encouraged to increase her fluid intake until her urine color is clear   I am struck by the nonsustained ventricular tachycardia. We will undertake a cardiac MR to assure that her cardiac structure and function is as normal as we can measure;  we will also do a signal average ECG      Sherryl Manges

## 2016-02-23 ENCOUNTER — Encounter: Payer: Self-pay | Admitting: Internal Medicine

## 2016-03-03 ENCOUNTER — Encounter: Payer: Self-pay | Admitting: Internal Medicine

## 2016-03-03 ENCOUNTER — Ambulatory Visit (HOSPITAL_COMMUNITY)
Admission: RE | Admit: 2016-03-03 | Discharge: 2016-03-03 | Disposition: A | Discharge status: Home / Self Care | Payer: Medicaid Other | Source: Ambulatory Visit | Attending: Internal Medicine | Admitting: Internal Medicine

## 2016-03-03 DIAGNOSIS — I071 Rheumatic tricuspid insufficiency: Secondary | ICD-10-CM | POA: Insufficient documentation

## 2016-03-03 DIAGNOSIS — I340 Nonrheumatic mitral (valve) insufficiency: Secondary | ICD-10-CM | POA: Insufficient documentation

## 2016-03-03 DIAGNOSIS — I472 Ventricular tachycardia, unspecified: Secondary | ICD-10-CM

## 2016-03-03 LAB — CREATININE, SERUM
CREATININE: 0.83 mg/dL (ref 0.44–1.00)
GFR calc Af Amer: >60 mL/min (ref >60)
GFR calc non Af Amer: >60 mL/min (ref >60)

## 2016-03-03 MED ORDER — GADOBENATE DIMEGLUMINE 529 MG/ML IV SOLN
20.0000 mL | Freq: Once | INTRAVENOUS | Status: AC | PRN
  Administered 2016-03-03: 20 mL via INTRAVENOUS

## 2016-03-04 ENCOUNTER — Telehealth: Payer: Self-pay | Admitting: Internal Medicine

## 2016-03-04 DIAGNOSIS — R002 Palpitations: Secondary | ICD-10-CM

## 2016-03-04 NOTE — Telephone Encounter (Signed)
As of 02/19/16 per Dr. Graciela HusbandsKlein:  - Your physician has recommended you make the following change in your medication:  1) Stop Flecainide 2) Stop Florinef 3) Change Proamitine to 5 mg twice daily (morning & lunch time)  4) Take Therma Tabs two tablets by mouth twice daily  Will review the patient's reported BP/ HR's with Dr. Graciela HusbandsKlein. She did have a cardiac MRI & Signal Averaged EKG yesterday- results pending.

## 2016-03-04 NOTE — Telephone Encounter (Signed)
New message      Pt states that removal of med and her blood pressure and heart rate have been all over the place and making you feel bad. Please call.    Pt c/o BP issue: STAT if pt c/o blurred vision, one-sided weakness or slurred speech  1. What are your last 5 BP readings? Today 138/86/124, yesterday 151/99/115, 78/49/62 Wednesday, Tuesday morning 64/35/93 , 145/104/135 Tuesday night  Tuesday, Monday 147/95/90, .  2. Are you having any other symptoms (ex. Dizziness, headache, blurred vision, passed out)? She stays dizzy   3. What is your BP issue? Make pt feel bad

## 2016-03-12 NOTE — Telephone Encounter (Signed)
Currently taking only therma tabs  2 bid Fluid Intake clear   Prev took metoprolol--initial benefit Will try metoprolol 50 mg succinate   Will repeat echo with Erie NoeVanessa to look for doppler evidence of noncompaction

## 2016-03-12 NOTE — Telephone Encounter (Signed)
Called and spoke with pt Significant palpitations   Sometimes racing sometimes like jello BP 135--79/80--50.>>> quite labile HR [id number],155,124,135

## 2016-03-15 ENCOUNTER — Telehealth: Payer: Self-pay | Admitting: Internal Medicine

## 2016-03-15 MED ORDER — METOPROLOL SUCCINATE ER 50 MG PO TB24: 50.0000 mg | ORAL_TABLET | Freq: Every day | ORAL | 3 refills | Status: DC

## 2016-03-15 NOTE — Telephone Encounter (Signed)
New message       Pt c/o medication issue:  1. Name of Medication: metoprolol 2. How are you currently taking this medication (dosage and times per day)?  3. Are you having a reaction (difficulty breathing--STAT)? no 4. What is your medication issue? prescription was just sent to pharmacy.  Pt states she is allergic to metoprolol. Please call pharmacy

## 2016-03-15 NOTE — Telephone Encounter (Signed)
New message     Tara Mahoney is calling stating that the pt is at the pharmacy saying that the doctor is suppose to call her in a new prescription but she is not sure of the name of the prescription. Please call.

## 2016-03-15 NOTE — Telephone Encounter (Signed)
Called [facility] and informed them that a message would be sent to Dr. Graciela HusbandsKlein. Patient has an allergy to Metoprolol. Will forward to Dr. Graciela HusbandsKlein to see if there is another medication he would like to prescribed to replace the patient's Metoprolol.

## 2016-03-15 NOTE — Telephone Encounter (Signed)
Returned call to pharmacy-rx sent per MD Graciela HusbandsKlein note:  Duke SalviaSteven C Klein, MD  Note    Currently taking only therma tabs  2 bid Fluid Intake clear   Prev took metoprolol--initial benefit Will try metoprolol 50 mg succinate   Will repeat echo with Erie NoeVanessa to look for doppler evidence of noncompaction

## 2016-03-16 ENCOUNTER — Other Ambulatory Visit: Payer: Self-pay | Admitting: Internal Medicine

## 2016-03-16 DIAGNOSIS — I429 Cardiomyopathy, unspecified: Secondary | ICD-10-CM

## 2016-03-16 NOTE — Telephone Encounter (Signed)
Echo ordered- additional phone notes on regarding a metoprolol allergy are open for Dr. Graciela HusbandsKlein to review.

## 2016-03-16 NOTE — Telephone Encounter (Signed)
Follow up    Tara Mahoney is calling for rn about the Metoporolol

## 2016-03-16 NOTE — Addendum Note (Signed)
Addended bySherri Rad: Rozann Holts C on: 03/16/2016 10:51 AM   Modules accepted: Orders

## 2016-03-17 MED ORDER — CARVEDILOL 3.125 MG PO TABS: 3.1250 mg | ORAL_TABLET | Freq: Two times a day (BID) | ORAL | 6 refills | Status: DC

## 2016-03-17 NOTE — Telephone Encounter (Signed)
[facility] coreg 3.125 mg BID.  Notes to pharmacist to d/c metoprolol.

## 2016-03-17 NOTE — Telephone Encounter (Signed)
With mild LV dysfunction less try carvedilol 3.125 twice a day

## 2016-03-18 ENCOUNTER — Telehealth: Payer: Self-pay | Admitting: Internal Medicine

## 2016-03-18 NOTE — Telephone Encounter (Signed)
Called and spoke with the patient. She apparently had hives and itching. I've added carvedilol to her allergy list. She notes that with the discontinuation of her flecainide ectopy got much worse and the dizziness which we had thought might be attributable did not improve. She will resume it at 150 mg twice daily and we will see her as previously scheduled

## 2016-03-18 NOTE — Telephone Encounter (Signed)
[facility] stating that the patient called them today. She took one dose of her coreg last night and started to have burning and itching. The patient has intolerances to metoprolol/ diltiazem/ verapamil. Will review with Dr. Graciela HusbandsKlein and call the patient with recommendations.

## 2016-03-23 MED ORDER — FLECAINIDE ACETATE 150 MG PO TABS: 150.0000 mg | ORAL_TABLET | Freq: Two times a day (BID) | ORAL | Status: DC

## 2016-03-29 ENCOUNTER — Other Ambulatory Visit: Payer: Self-pay

## 2016-03-29 ENCOUNTER — Encounter (HOSPITAL_COMMUNITY): Payer: Self-pay | Admitting: Radiology

## 2016-03-29 ENCOUNTER — Ambulatory Visit (HOSPITAL_COMMUNITY): Payer: Medicaid Other | Attending: Internal Medicine

## 2016-03-29 DIAGNOSIS — I340 Nonrheumatic mitral (valve) insufficiency: Secondary | ICD-10-CM | POA: Diagnosis not present

## 2016-03-29 DIAGNOSIS — I472 Ventricular tachycardia: Secondary | ICD-10-CM | POA: Insufficient documentation

## 2016-03-29 DIAGNOSIS — E785 Hyperlipidemia, unspecified: Secondary | ICD-10-CM | POA: Insufficient documentation

## 2016-03-29 DIAGNOSIS — R002 Palpitations: Secondary | ICD-10-CM | POA: Insufficient documentation

## 2016-03-29 DIAGNOSIS — R011 Cardiac murmur, unspecified: Secondary | ICD-10-CM | POA: Insufficient documentation

## 2016-03-29 MED ORDER — PERFLUTREN LIPID MICROSPHERE
1.0000 mL | INTRAVENOUS | Status: AC | PRN
  Administered 2016-03-29: 3 mL via INTRAVENOUS

## 2016-03-29 NOTE — Progress Notes (Signed)
Patient complained of headache 20 minutes post infusion of Definity. Spoke with DOD, Dr. Johney FrameAllred, regarding patient symptoms. Patient's vitals were stable. Patient discharged per Dr. Johney FrameAllred.

## 2016-04-13 ENCOUNTER — Ambulatory Visit: Payer: Medicaid Other | Admitting: Cardiology

## 2016-04-28 ENCOUNTER — Ambulatory Visit (INDEPENDENT_AMBULATORY_CARE_PROVIDER_SITE_OTHER): Payer: Medicaid Other | Admitting: Internal Medicine

## 2016-04-28 ENCOUNTER — Encounter: Payer: Self-pay | Admitting: Internal Medicine

## 2016-04-28 VITALS — BP 118/79 | HR 85 | Ht 63.0 in | Wt 132.2 lb

## 2016-04-28 DIAGNOSIS — G909 Disorder of the autonomic nervous system, unspecified: Secondary | ICD-10-CM | POA: Diagnosis not present

## 2016-04-28 DIAGNOSIS — I493 Ventricular premature depolarization: Secondary | ICD-10-CM

## 2016-04-28 DIAGNOSIS — R002 Palpitations: Secondary | ICD-10-CM

## 2016-04-28 MED ORDER — LISINOPRIL 2.5 MG PO TABS: 2.5000 mg | ORAL_TABLET | Freq: Every day | ORAL | 3 refills | Status: DC

## 2016-04-28 NOTE — Patient Instructions (Signed)
Medication Instructions: Your physician has recommended you make the following change in your medication:   1. START Lisinopril 2.5 mg - Take 1 tablet by mouth daily   Labwork: Your physician recommends that you return for lab work in: 2 weeks - BMET   Procedures/Testing: None Ordered  Follow-Up: Your physician recommends that you schedule a follow-up appointment in 3 MONTHS with Dr. Graciela HusbandsKlein   Any Additional Special Instructions Will Be Listed Below (If Applicable).     If you need a refill on your cardiac medications before your next appointment, please call your pharmacy.

## 2016-04-28 NOTE — Progress Notes (Signed)
Patient Care Team: Oval Linsey, MD as PCP - General (Internal Medicine) Antoine Poche, MD as Consulting Physician (Cardiology) Marinus Maw, MD as Consulting Physician (Cardiology)   HPI  Tara Mahoney is a 30 y.o. female Seen in follow-up for Holter monitoring that showed  some PACs and PVCs and some nonsustained ventricular tachycardia. Multiple episodes of sinus tachycardia have also been identified. Structural cardiac evaluation included an echo and a stress echo which were reported as normal.  She was started on flecainide.  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 A follow-up echo was performed looking for Doppler flow in the crypts; unfortunately, the Doppler studies were not done. Again non-compaction was entertained. I had a series of physicians look at these data. There is no consensus that non-compaction present.  She also has heat intolerance. She has shower intolerance. She had, prior to her hysterectomy with her children, significant worsening of symptoms around her periods. Lightheadedness exercise intolerance is been frequent. Symptoms are typically worse in the summer. She has had some syncope and presyncope. They're characterized by a prodrome of nausea flushing diaphoresis and characterize the recovery phase by significant residual fatigue.  These have been relatively stable.  She increase her volume without appreciable change in her symptoms.   Past Medical History:  Diagnosis Date  . Blood transfusion without reported diagnosis   . Complication of anesthesia   . Dysautonomia    sinus arrythmia  . Family history of anesthesia complication    PONV  . Headache(784.0)   . Heart murmur   . MVP (mitral valve prolapse)   . PONV (postoperative nausea and vomiting)   . Ventricular tachycardia, nonsustained [facility])     Past Surgical History:  Procedure Laterality Date  .  ABDOMINAL HYSTERECTOMY    . CERVICAL CERCLAGE  2008  . CERVICAL CERCLAGE  09/08/2011   Procedure: CERCLAGE CERVICAL;  Surgeon: Lazaro Arms, MD;  Location: [facility];  Service: Gynecology;  Laterality: N/A;  [name],CST-scrubbed; doctor scrubbed in @ [phone]  . LAPAROSCOPIC LYSIS OF ADHESIONS  08/09/2012   Procedure: LAPAROSCOPIC LYSIS OF ADHESIONS;  Surgeon: Lazaro Arms, MD;  Location: [facility];  Service: Gynecology;  Laterality: N/A;  . LAPAROSCOPY  08/09/2012   Procedure: LAPAROSCOPY DIAGNOSTIC;  Surgeon: Lazaro Arms, MD;  Location: [facility];  Service: Gynecology;  Laterality: N/A;  started at  1208-Vaginal trachelectomy (vaginal removal of cervical stump)   . MYRINGOTOMY    . SUPRACERVICAL ABDOMINAL HYSTERECTOMY  01/26/2012   Procedure: HYSTERECTOMY SUPRACERVICAL ABDOMINAL;  Surgeon: Lazaro Arms, MD;  Location: [facility];  Service: Gynecology;;  converted [phone]    Current Outpatient Prescriptions  Medication Sig Dispense Refill  . flecainide (TAMBOCOR) 150 MG tablet Take 1 tablet (150 mg total) by mouth 2 (two) times daily.    . sodium chloride 1 g tablet Take 1 g by mouth daily.     No current facility-administered medications for this visit.     Allergies  Allergen Reactions  . Cardizem Cd [Diltiazem Hcl Er Beads]     rash  . Carvedilol Hives and Itching  . Definity [Perflutren Lipid Microsphere] Other (See Comments)    Headach  . Hydrocodone-Acetaminophen Itching and Nausea And Vomiting  . Metoprolol     Headaches, dizzyness  . Morphine And Related     nausea  . Verapamil     Racing heart, dizzyness, headache,shaking all over her  body       Review of Systems negative except from HPI and PMH  Physical Exam BP 118/79   Pulse 85   Ht 5\' 3"  (1.6 m)   Wt 132 lb 3.2 oz (60 kg)   LMP 05/04/2011   SpO2 99%   BMI 23.42 kg/m  Well developed and well nourished in no acute distress HENT normal E scleral and icterus clear Neck Supple JVP flat; carotids brisk and  full Clear to ausculation  Regular rate and rhythm, no murmurs gallops or rub Soft with active bowel sounds No clubbing cyanosis  Edema Alert and oriented, grossly normal motor and sensory function Skin Warm and Dry  Sinus at 85 minutes intervals 16/08/38 Otherwise normal  Assessment and  Plan  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 We will add ACE inhibitor therapy based on the SAVE trial in the context of mild LV dysfunction. There is also another trial where in ACE inhibitor therapy was noted to be associated with a decreased burden of ventricular ectopy. Rather than attempt other medications for her PVCs which remained problematic the ACE inhibitor don't work. I've also recommended OTC magnesium. We'll plan to review this again in 3 months.  Her family history was reviewed. She is a striking history for coronary artery disease  More than 50% of 45 min was spent in counseling related to the above    Current medicines are reviewed at length with the patient today .  The patient does not  have concerns regarding medicines.

## 2016-05-11 ENCOUNTER — Other Ambulatory Visit: Payer: Self-pay | Admitting: Internal Medicine

## 2016-05-11 LAB — BASIC METABOLIC PANEL
BUN: 13 mg/dL (ref 7–25)
CHLORIDE: 102 mmol/L (ref 98–110)
CO2: 27 mmol/L (ref 20–31)
Calcium: 9.5 mg/dL (ref 8.6–10.2)
Creat: 0.89 mg/dL (ref 0.50–1.10)
GLUCOSE: 79 mg/dL (ref 65–99)
POTASSIUM: 4.2 mmol/L (ref 3.5–5.3)
SODIUM: 138 mmol/L (ref 135–146)

## 2016-05-18 ENCOUNTER — Telehealth: Payer: Self-pay

## 2016-05-18 NOTE — Telephone Encounter (Signed)
Pt is aware and agreeable to normal results  

## 2016-05-30 ENCOUNTER — Encounter: Payer: Self-pay | Admitting: Internal Medicine

## 2016-09-21 ENCOUNTER — Encounter: Payer: Self-pay | Admitting: Internal Medicine

## 2016-09-24 ENCOUNTER — Telehealth: Payer: Self-pay | Admitting: Internal Medicine

## 2016-09-24 ENCOUNTER — Ambulatory Visit (INDEPENDENT_AMBULATORY_CARE_PROVIDER_SITE_OTHER): Payer: Medicaid Other | Admitting: Internal Medicine

## 2016-09-24 ENCOUNTER — Encounter: Payer: Self-pay | Admitting: Internal Medicine

## 2016-09-24 VITALS — BP 104/74 | HR 98 | Ht 63.0 in | Wt 140.0 lb

## 2016-09-24 DIAGNOSIS — I493 Ventricular premature depolarization: Secondary | ICD-10-CM | POA: Diagnosis not present

## 2016-09-24 MED ORDER — FLECAINIDE ACETATE 150 MG PO TABS: 75.0000 mg | ORAL_TABLET | Freq: Two times a day (BID) | ORAL | Status: DC

## 2016-09-24 MED ORDER — BISOPROLOL FUMARATE 5 MG PO TABS
5.0000 mg | ORAL_TABLET | Freq: Every day | ORAL | 0 refills | Status: DC
Start: 2016-09-24 — End: 2016-12-30

## 2016-09-24 MED ORDER — NEBIVOLOL HCL 2.5 MG PO TABS: 2.5000 mg | ORAL_TABLET | Freq: Every day | ORAL | 0 refills | Status: DC

## 2016-09-24 MED ORDER — ATENOLOL 25 MG PO TABS: 25.0000 mg | ORAL_TABLET | Freq: Every day | ORAL | 0 refills | Status: DC

## 2016-09-24 NOTE — Telephone Encounter (Signed)
Attempted to call the pharmacy- ringing busy on multiple attempts.

## 2016-09-24 NOTE — Patient Instructions (Addendum)
Medication Instructions: Your physician has recommended you make the following change in your medication:  ** You are being given prescriptions for 3 different beta blockers to try. You may take them in any order, but DO NOT take more then 1 at a time. Once you start one, please try to give it at least 2 weeks on each one to see how you tolerate**  1) Atenolol 25 mg- take one tablet by mouth once daily 2) Bisoprolol 5 mg- take one tablet by mouth once daily 3) Nebivolol 2.5 mg- take one tablet by mouth once daily   4) Decrease flecainide 150 mg- take 1/2 tablet (75 mg) by mouth twice   Labwork: None Ordered  Procedures/Testing: -Your physician has requested that you have an echocardiogram. Echocardiography is a painless test that uses sound waves to create images of your heart. It provides your doctor with information about the size and shape of your heart and how well your heart's chambers and valves are working. This procedure takes approximately one hour. There are no restrictions for this procedure.  Follow-Up: - Your physician wants you to follow-up in 6 MONTHS with Dr. Graciela HusbandsKlein. You will receive a reminder letter in the mail two months in advance. If you don't receive a letter, please call our office to schedule the follow-up appointment.   Any Additional Special Instructions Will Be Listed Below (If Applicable).     If you need a refill on your cardiac medications before your next appointment, please call your pharmacy.

## 2016-09-24 NOTE — Telephone Encounter (Signed)
Marchelle FolksAmanda with [facility] is calling, states that her system shows patient has an allergy to beta blockers. Marchelle Folksmanda states that she received a prescription for beta blockers. Please call to discuss. Thanks.

## 2016-09-24 NOTE — Telephone Encounter (Signed)
I called and spoke with the pharmacist- she states the patient states she can't take atenolol due to a reaction in the past (unknown).  They will try bystolic first as bisoprolol is not covered by Medicaid.

## 2016-09-24 NOTE — Progress Notes (Signed)
Patient Care Team: Oval Linsey, MD as PCP - General (Internal Medicine) Antoine Poche, MD as Consulting Physician (Cardiology) Marinus Maw, MD as Consulting Physician (Cardiology)   HPI  Tara Mahoney is a 31 y.o. female Seen in follow-up for Holter monitoring that showed  some PACs and PVCs and some nonsustained ventricular tachycardia. Multiple episodes of sinus tachycardia have also been identified. Structural cardiac evaluation included an echo and a stress echo which were reported as normal.  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 A follow-up echo was performed looking for Doppler flow in the crypts; unfortunately, the Doppler studies were not done. Again non-compaction was entertained. I had a series of physicians look at these data. There is no consensus that non-compaction present.  She continues to struggle with exercise, shower and heat intolerance. Her diet is replete sodium with supplementation; fluid status isn't bad. She is trying to be active. She is status post hysterectomy.   She has recurrent palpitations which are relatively brief sometimes and irregular in addition to her exercise intolerance associated with tachypalpitations     Past Medical History:  Diagnosis Date  . Blood transfusion without reported diagnosis   . Complication of anesthesia   . Dysautonomia    sinus arrythmia  . Family history of anesthesia complication    PONV  . Headache(784.0)   . Heart murmur   . MVP (mitral valve prolapse)   . PONV (postoperative nausea and vomiting)   . Ventricular tachycardia, nonsustained [facility])     Past Surgical History:  Procedure Laterality Date  . ABDOMINAL HYSTERECTOMY    . CERVICAL CERCLAGE  2008  . CERVICAL CERCLAGE  09/08/2011   Procedure: CERCLAGE CERVICAL;   Surgeon: Lazaro Arms, MD;  Location: [facility];  Service: Gynecology;  Laterality: N/A;  [name],CST-scrubbed; doctor scrubbed in @ [phone]  . LAPAROSCOPIC LYSIS OF ADHESIONS  08/09/2012   Procedure: LAPAROSCOPIC LYSIS OF ADHESIONS;  Surgeon: Lazaro Arms, MD;  Location: [facility];  Service: Gynecology;  Laterality: N/A;  . LAPAROSCOPY  08/09/2012   Procedure: LAPAROSCOPY DIAGNOSTIC;  Surgeon: Lazaro Arms, MD;  Location: [facility];  Service: Gynecology;  Laterality: N/A;  started at  1208-Vaginal trachelectomy (vaginal removal of cervical stump)   . MYRINGOTOMY    . SUPRACERVICAL ABDOMINAL HYSTERECTOMY  01/26/2012   Procedure: HYSTERECTOMY SUPRACERVICAL ABDOMINAL;  Surgeon: Lazaro Arms, MD;  Location: [facility];  Service: Gynecology;;  converted [phone]    Current Outpatient Prescriptions  Medication Sig Dispense Refill  . flecainide (TAMBOCOR) 150 MG tablet Take 1 tablet (150 mg total) by mouth 2 (two) times daily.    Marland Kitchen lisinopril (PRINIVIL,ZESTRIL) 2.5 MG tablet Take 1 tablet (2.5 mg total) by mouth daily. 90 tablet 3  . sodium chloride 1 g tablet Take 1 g by mouth daily.     No current facility-administered medications for this visit.     Allergies  Allergen Reactions  . Cardizem Cd [Diltiazem Hcl Er Beads]     rash  . Carvedilol Hives and Itching  . Definity [Perflutren Lipid Microsphere] Other (See Comments)    Headach  . Hydrocodone-Acetaminophen Itching and Nausea And Vomiting  . Metoprolol     Headaches, dizzyness  . Morphine And Related     nausea  . Verapamil  Racing heart, dizzyness, headache,shaking all over her body       Review of Systems negative except from HPI and PMH  Physical Exam BP 104/74   Pulse 98   Ht 5\' 3"  (1.6 m)   Wt 140 lb (63.5 kg)   LMP 05/04/2011   SpO2 98%   BMI 24.80 kg/m  Well developed and well nourished in no acute distress HENT normal E scleral and icterus clear Neck Supple JVP flat; carotids brisk and full Clear to  ausculation  Regular rate and rhythm, no murmurs gallops or rub Soft with active bowel sounds No clubbing cyanosis  Edema Alert and oriented, grossly normal motor and sensory function Skin Warm and Dry  Sinus at 85 minutes intervals 16/08/38 Otherwise normal  Assessment and  Plan  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 We will use AliveCor monitor.  We reviewed again extensively the issues of dysautonomia, the physiology of orthstasis and positional stress.     Stressed importance of exercise  With worseing DOE will check an echo to review LV function, but I suspect it is more related to dysautonomia.  Orthostatics today are most notable for tachycardia without falling BP, so will try again with low dose BB, atenolol, nebivolol, and bisprolol     More than 50% of 45 min was spent in counseling related to the above    Current medicines are reviewed at length with the patient today .  The patient does not  have concerns regarding medicines.

## 2016-09-27 ENCOUNTER — Telehealth: Payer: Self-pay | Admitting: *Deleted

## 2016-09-27 NOTE — Telephone Encounter (Signed)
Submitted Prior Authorization for Bisoprolol to [employer]. Received approval from 09/27/16 until 09/22/17. PA number [id number]. Interaction number [id number]. Patient had tried 2 or more of the drugs listed on the criteria and failed (metoprolol, atenolol).  Patient notified that Bisoprolol was approved. She verbalized understanding.

## 2016-09-28 NOTE — Addendum Note (Signed)
Addended by: Jadah Bobak C on: 09/28/2016 11:20 AM   Modules accepted: Orders  

## 2016-09-29 ENCOUNTER — Encounter (HOSPITAL_COMMUNITY): Payer: Self-pay | Admitting: Emergency Medicine

## 2016-09-29 ENCOUNTER — Emergency Department (HOSPITAL_COMMUNITY)
Admission: EM | Admit: 2016-09-29 | Discharge: 2016-09-29 | Disposition: A | Discharge status: Home / Self Care | Payer: Medicaid Other | Attending: Emergency Medicine | Admitting: Emergency Medicine

## 2016-09-29 ENCOUNTER — Emergency Department (HOSPITAL_COMMUNITY): Payer: Medicaid Other

## 2016-09-29 ENCOUNTER — Telehealth: Payer: Self-pay | Admitting: Internal Medicine

## 2016-09-29 DIAGNOSIS — Z87891 Personal history of nicotine dependence: Secondary | ICD-10-CM | POA: Insufficient documentation

## 2016-09-29 DIAGNOSIS — Z79899 Other long term (current) drug therapy: Secondary | ICD-10-CM | POA: Diagnosis not present

## 2016-09-29 DIAGNOSIS — R55 Syncope and collapse: Secondary | ICD-10-CM | POA: Diagnosis not present

## 2016-09-29 DIAGNOSIS — R0689 Other abnormalities of breathing: Secondary | ICD-10-CM | POA: Insufficient documentation

## 2016-09-29 LAB — BASIC METABOLIC PANEL
ANION GAP: 5 (ref 5–15)
BUN: 15 mg/dL (ref 6–20)
CALCIUM: 9 mg/dL (ref 8.9–10.3)
CHLORIDE: 101 mmol/L (ref 101–111)
CO2: 29 mmol/L (ref 22–32)
CREATININE: 0.93 mg/dL (ref 0.44–1.00)
GFR calc non Af Amer: >60 mL/min (ref >60)
Glucose, Bld: 92 mg/dL (ref 65–99)
Potassium: 4.5 mmol/L (ref 3.5–5.1)
SODIUM: 135 mmol/L (ref 135–145)

## 2016-09-29 LAB — CBC WITH DIFFERENTIAL/PLATELET
Basophils Absolute: 0 10*3/uL (ref 0.0–0.1)
Basophils Relative: 0 %
EOS ABS: 0.4 10*3/uL (ref 0.0–0.7)
EOS PCT: 8 %
HCT: 39.1 % (ref 36.0–46.0)
Hemoglobin: 13.6 g/dL (ref 12.0–15.0)
LYMPHS ABS: 2.2 10*3/uL (ref 0.7–4.0)
Lymphocytes Relative: 45 %
MCH: 31.3 pg (ref 26.0–34.0)
MCHC: 34.8 g/dL (ref 30.0–36.0)
MCV: 89.9 fL (ref 78.0–100.0)
MONO ABS: 0.3 10*3/uL (ref 0.1–1.0)
Monocytes Relative: 6 %
Neutro Abs: 2.1 10*3/uL (ref 1.7–7.7)
Neutrophils Relative %: 41 %
PLATELETS: 152 10*3/uL (ref 150–400)
RBC: 4.35 MIL/uL (ref 3.87–5.11)
RDW: 12.2 % (ref 11.5–15.5)
WBC: 5.1 10*3/uL (ref 4.0–10.5)

## 2016-09-29 LAB — BRAIN NATRIURETIC PEPTIDE: B NATRIURETIC PEPTIDE 5: 13 pg/mL (ref 0.0–100.0)

## 2016-09-29 LAB — PREGNANCY, URINE: Preg Test, Ur: NEGATIVE

## 2016-09-29 LAB — I-STAT TROPONIN, ED: TROPONIN I, POC: 0 ng/mL (ref 0.00–0.08)

## 2016-09-29 LAB — MAGNESIUM: Magnesium: 2 mg/dL (ref 1.7–2.4)

## 2016-09-29 NOTE — ED Triage Notes (Signed)
Pt reports her blood pressure has been up and down today.  Had episode earlier where she got dizzy, had blurred vision, and thought she was going to pass out.  Pt states her bp meds were changed on Monday.

## 2016-09-29 NOTE — Telephone Encounter (Signed)
Pt states she started taking bisoprolol 5mg  daily 2 days ago.  Pt states today her BP has been very high and very low, BP a few minutes ago 150/110, HR 89.  Pt states she currently has blurred, dark vision, headache, feeling very hot and sweaty.   Pt advised to report to ED for further evaluation of BP and symptoms, pt advised not to drive, pt agreed with plan.

## 2016-09-29 NOTE — ED Provider Notes (Signed)
[facility] Provider Note   CSN: [id number] Arrival date & time: 09/29/16  1717     History   Chief Complaint Chief Complaint  Patient presents with  . Near Syncope    HPI Tara Mahoney is a 31 y.o. female.  HPI 31 year old female who presents with near syncope. She has a history of dysautonomia, and sustained ventricular tachycardia on flecainide, and mitral valve prolapse. Follows with Dr. Graciela Husbands from cardiology. I was recently started on bisoprolol for heart rate management one day ago. Previously was on lisinopril. States that he she has not tolerated beta blockers in the past. States that she has been feeling woozy since taking this medication yesterday. This morning her initial blood pressure was 60/52. States that later on in the day upon standing up she began to feel very lightheaded, with blurry vision, and felt like she was about to pass out. She felt diaphoretic with this. She did check her blood pressure at that time and was 152 systolic. She called her cardiologist's office and was sent to the ED for evaluation. She'll have chest pain or difficulty breathing, or true syncope. This does not feel similar to when she had ventricular tachycardia cardiac. Has been eating and drinking normally. No vomiting, diarrhea, fevers, or cough. No leg swelling or leg pain. This had several weeks of dyspnea on exertion and PND which she has been seeing her cardiologist for with scheduled echo on March 26. Past Medical History:  Diagnosis Date  . Blood transfusion without reported diagnosis   . Complication of anesthesia   . Dysautonomia    sinus arrythmia  . Family history of anesthesia complication    PONV  . Headache(784.0)   . Heart murmur   . MVP (mitral valve prolapse)   . PONV (postoperative nausea and vomiting)   . Ventricular tachycardia, nonsustained [facility])     Patient Active Problem List   Diagnosis Date Noted  . Autonomic dysfunction 03/20/2015  . Chest pain  02/27/2014  . Sinus tachycardia 02/06/2014  . PVC (premature ventricular contraction) 05/09/2013  . PAC (premature atrial contraction) 05/09/2013  . Nonsustained ventricular tachycardia (HCC) 05/09/2013  . Abdominal pain, left lower quadrant 02/20/2013  . 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 PALPITATIONS 09/23/2010    Past Surgical History:  Procedure Laterality Date  . ABDOMINAL HYSTERECTOMY    . CERVICAL CERCLAGE  2008  . CERVICAL CERCLAGE  09/08/2011   Procedure: CERCLAGE CERVICAL;  Surgeon: Lazaro Arms, MD;  Location: [facility];  Service: Gynecology;  Laterality: N/A;  [name],CST-scrubbed; doctor scrubbed in @ [phone]  . LAPAROSCOPIC LYSIS OF ADHESIONS  08/09/2012   Procedure: LAPAROSCOPIC LYSIS OF ADHESIONS;  Surgeon: Lazaro Arms, MD;  Location: [facility];  Service: Gynecology;  Laterality: N/A;  . LAPAROSCOPY  08/09/2012   Procedure: LAPAROSCOPY DIAGNOSTIC;  Surgeon: Lazaro Arms, MD;  Location: [facility];  Service: Gynecology;  Laterality: N/A;  started at  1208-Vaginal trachelectomy (vaginal removal of cervical stump)   . MYRINGOTOMY    . SUPRACERVICAL ABDOMINAL HYSTERECTOMY  01/26/2012   Procedure: HYSTERECTOMY SUPRACERVICAL ABDOMINAL;  Surgeon: Lazaro Arms, MD;  Location: [facility];  Service: Gynecology;;  converted [phone]    OB History    Gravida Para Term Preterm AB Living   2 2 1 1   2    SAB TAB Ectopic Multiple Live Births           1       Home Medications    Prior to  Admission medications   Medication Sig Start Date End Date Taking? Authorizing Provider  bisoprolol (ZEBETA) 5 MG tablet Take 1 tablet (5 mg total) by mouth daily. 09/24/16  Yes Duke Salvia, MD  flecainide (TAMBOCOR) 150 MG tablet Take 0.5 tablets (75 mg total) by mouth 2 (two) times daily. 09/24/16  Yes Duke Salvia, MD  sodium chloride 1 g tablet Take 1 g by mouth daily.   Yes Historical Provider, MD    Family History Family History  Problem Relation Age of Onset  . Hypertension Brother   .  Heart disease Brother     hole in heart  . Ovarian cancer Mother   . Anesthesia problems Neg Hx   . Hypotension Neg Hx   . Pseudochol deficiency Neg Hx   . Malignant hyperthermia Neg Hx   . Other Neg Hx     Social History Social History  Substance Use Topics  . Smoking status: Former Smoker    Years: 1.00    Types: Cigarettes    Start date: 10/01/2001    Quit date: 09/01/2009  . Smokeless tobacco: Never Used     Comment: smokes 1 pack a week  . Alcohol use No     Allergies   Atenolol; Carvedilol; Definity [perflutren lipid microsphere]; Hydrocodone-acetaminophen; Metoprolol; Morphine and related; Verapamil; Beta adrenergic blockers; and Cardizem cd [diltiazem hcl er beads]   Review of Systems Review of Systems 10/14 systems reviewed and are negative other than those stated in the HPI   Physical Exam Updated Vital Signs BP 107/68 (BP Location: Right Arm)   Pulse 71   Temp 98.4 F (36.9 C) (Oral)   Resp 19   Ht 5\' 3"  (1.6 m)   Wt 140 lb (63.5 kg)   LMP 05/04/2011   SpO2 100%   BMI 24.80 kg/m   Physical Exam Physical Exam  Nursing note and vitals reviewed. Constitutional: Well developed, well nourished, non-toxic, and in no acute distress Head: Normocephalic and atraumatic.  Mouth/Throat: Oropharynx is clear and moist.  Neck: Normal range of motion. Neck supple.  Cardiovascular: Normal rate and regular rhythm.   Pulmonary/Chest: Effort normal and breath sounds normal.  Abdominal: Soft. There is no tenderness. There is no rebound and no guarding.  Musculoskeletal: Normal range of motion.  Neurological: Alert, no facial droop, fluent speech, moves all extremities symmetrically Skin: Skin is warm and dry.  Psychiatric: Cooperative   ED Treatments / Results  Labs (all labs ordered are listed, but only abnormal results are displayed) Labs Reviewed  CBC WITH DIFFERENTIAL/PLATELET  BASIC METABOLIC PANEL  BRAIN NATRIURETIC PEPTIDE  MAGNESIUM  PREGNANCY, URINE   I-STAT TROPOININ, ED    EKG  EKG Interpretation  Date/Time:  Wednesday September 29 2016 17:38:36 EDT Ventricular Rate:  64 PR Interval:    QRS Duration: 92 QT Interval:  429 QTC Calculation: 443 R Axis:   85 Text Interpretation:  Sinus rhythm Baseline wander in lead(s) II similr to prior EKG  Confirmed by Camaron Cammack MD, Michelina Mexicano [phone]) on 09/29/2016 7:17:58 PM       Radiology Dg Chest 2 View  Result Date: 09/29/2016 CLINICAL DATA:  31 year old female with near syncope. Episodes of dizziness and blurred vision. EXAM: CHEST  2 VIEW COMPARISON:  Chest radiograph dated 01/12/2016 FINDINGS: The heart size and mediastinal contours are within normal limits. Both lungs are clear. The visualized skeletal structures are unremarkable. IMPRESSION: No active cardiopulmonary disease. Electronically Signed   By: Elgie Collard M.D.   On: 09/29/2016  18:07    Procedures Procedures (including critical care time)  Medications Ordered in ED Medications - No data to display   Initial Impression / Assessment and Plan / ED Course  I have reviewed the triage vital signs and the nursing notes.  Pertinent labs & imaging results that were available during my care of the patient were reviewed by me and considered in my medical decision making (see chart for details).    Records Reviewed. Followed by Dr. Graciela HusbandsKlein from cardiology. Non-sustained VT, multiple PVC burden, sinus tachycardia found on holter monitoring to tachypalpitation history she has had. Has had recent history of exercise intolerance for which she is getting outpatient ECHO through Dr. Graciela HusbandsKlein.   She is well-appearing on arrival. Vital signs within normal limits. Blood pressure at her reported baseline. Possible this may be result of taking her new beta blocker given that the symptoms all started afterwards. Current EKG shows no stigmata of arrhythmia. There are no major electrolyte or metabolic derangements. CXR visualized and clear. Cardiac markers  normal and no signs of CHF either.   Felt to be stable for continued follow-up with cardiology as outpatient. Will hold her beta blocker now. Will call her cardiologist tomorrow to discuss medication changes. To return for worsening or new symptoms.   The patient appears reasonably screened and/or stabilized for discharge and I doubt any other medical condition or other [facility] requiring further screening, evaluation, or treatment in the ED at this time prior to discharge.     Final Clinical Impressions(s) / ED Diagnoses   Final diagnoses:  Near syncope    New Prescriptions New Prescriptions   No medications on file     Lavera Guiseana Duo Jolynda Townley, MD 09/29/16 2013

## 2016-09-29 NOTE — Discharge Instructions (Signed)
Your work up today is reassuring. Please call your cardiologist tomorrow to discuss any needed medication changes. Hold your beta blocker for now. Return without fail for worsening symptoms, including chest pain, difficulty breathing, passing out, or any other symptoms concerning to you.

## 2016-09-29 NOTE — Telephone Encounter (Signed)
New message    Pt c/o BP issue: STAT if pt c/o blurred vision, one-sided weakness or slurred speech  1. What are your last 5 BP readings? 152/110 now  2. Are you having any other symptoms (ex. Dizziness, headache, blurred vision, passed out)? Nausea, sweating, blurred vision   3. What is your BP issue? bp is high. Pt states she just recently started new medication

## 2016-09-29 NOTE — ED Notes (Signed)
Pt alert & oriented x4, stable gait. Patient given discharge instructions, paperwork & prescription(s). Patient  instructed to stop at the registration desk to finish any additional paperwork. Patient verbalized understanding. Pt left department w/ no further questions. 

## 2016-09-30 ENCOUNTER — Encounter: Payer: Self-pay | Admitting: Internal Medicine

## 2016-10-11 ENCOUNTER — Other Ambulatory Visit: Payer: Self-pay

## 2016-10-11 ENCOUNTER — Ambulatory Visit (HOSPITAL_COMMUNITY): Payer: Medicaid Other | Attending: Cardiology

## 2016-10-11 DIAGNOSIS — R0600 Dyspnea, unspecified: Secondary | ICD-10-CM | POA: Insufficient documentation

## 2016-10-11 DIAGNOSIS — I493 Ventricular premature depolarization: Secondary | ICD-10-CM | POA: Diagnosis present

## 2016-10-29 ENCOUNTER — Other Ambulatory Visit: Payer: Self-pay | Admitting: Cardiology

## 2016-10-29 ENCOUNTER — Other Ambulatory Visit: Payer: Self-pay | Admitting: Internal Medicine

## 2016-10-29 NOTE — Telephone Encounter (Signed)
Author: Baird Lyons, RN Author Type: Registered Nurse Filed: 09/24/2016 11:26 AM  Note Status: Addendum Cosign: Cosign Not Required Encounter Date: 09/24/2016  Editor: Jefferey Pica, RN (Registered Nurse)  Prior Versions: 1. Jefferey Pica, RN (Registered Nurse) at 09/24/2016 11:22 AM - Addendum   2. Jefferey Pica, RN (Registered Nurse) at 09/24/2016 11:15 AM - Addendum   3. Rebbeca Paul, CMA (Scientist, forensic) at 09/24/2016 11:13 AM - Addendum   4. Baird Lyons, RN (Registered Nurse) at 09/24/2016 10:21 AM - Signed    Medication Instructions: Your physician has recommended you make the following change in your medication:  ** You are being given prescriptions for 3 different beta blockers to try. You may take them in any order, but DO NOT take more then 1 at a time. Once you start one, please try to give it at least 2 weeks on each one to see how you tolerate**  1) Atenolol 25 mg- take one tablet by mouth once daily 2) Bisoprolol 5 mg- take one tablet by mouth once daily 3) Nebivolol 2.5 mg- take one tablet by mouth once daily   4) Decrease flecainide 150 mg- take 1/2 tablet (75 mg) by mouth twice

## 2016-11-03 ENCOUNTER — Encounter: Payer: Self-pay | Admitting: Internal Medicine

## 2016-12-04 ENCOUNTER — Encounter: Payer: Self-pay | Admitting: Internal Medicine

## 2016-12-30 ENCOUNTER — Ambulatory Visit (INDEPENDENT_AMBULATORY_CARE_PROVIDER_SITE_OTHER): Payer: Medicaid Other | Admitting: Internal Medicine

## 2016-12-30 ENCOUNTER — Telehealth: Payer: Self-pay

## 2016-12-30 VITALS — BP 116/80 | HR 116 | Ht 63.0 in | Wt 147.0 lb

## 2016-12-30 DIAGNOSIS — I493 Ventricular premature depolarization: Secondary | ICD-10-CM | POA: Diagnosis not present

## 2016-12-30 DIAGNOSIS — R002 Palpitations: Secondary | ICD-10-CM | POA: Diagnosis not present

## 2016-12-30 DIAGNOSIS — G909 Disorder of the autonomic nervous system, unspecified: Secondary | ICD-10-CM

## 2016-12-30 NOTE — Telephone Encounter (Signed)
Patient's pharmacy calling stating that two of the handwritten prescriptions given to the patient at today's OV (zebeta and bystolic) the patient is allergic to and cannot take. They state that the patient can take the 3rd Rx inderal but she has Medicaid and the medication is not covered. Discussed with Dr. Graciela HusbandsKlein. Dr. Graciela HusbandsKlein wants patient to disregard the 3 Rx given and try corlanor 5 mg BID. Called and informed the patient and let her know that a 1 month supply of samples have been left at the  front desk for her to pick up. Patient made aware that the medication will lower her HR and was advised to monitor it. Patient verbalized understanding and is in agreement with this plan.

## 2016-12-30 NOTE — Progress Notes (Signed)
Patient Care Team: Oval Linseyondiego, Richard, MD as PCP - General (Internal Medicine) Wyline MoodBranch, Dorothe PeaJonathan F, MD as Consulting Physician (Cardiology) Marinus Mawaylor, Gregg W, MD as Consulting Physician (Cardiology)   HPI  Tara Mahoney is a 31 y.o. female Seen in follow-up for Holter monitoring that showed  some PACs and PVCs and some nonsustained ventricular tachycardia. Multiple episodes of sinus tachycardia have also been identified. Structural cardiac evaluation included an echo and a stress echo which were reported as normal.  She was started on flecainide;  She does not think she is better on the higher dose of flecainide  however, she is having less of the irregular palpitations and more of the sustained tachycardia  This continues to intrude into life limiting exercise. She has shower and heat intolerance. 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 A follow-up echo was performed looking for Doppler flow in the crypts; unfortunately, the Doppler studies were not done. Again non-compaction was entertained. I had a series of physicians look at these data. There is no consensus that non-compaction present.   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 Blood transfusion without reported diagnosis   . Complication of anesthesia   . Dysautonomia    sinus arrythmia  . Family history of anesthesia complication    PONV  . Headache(784.0)   . Heart murmur   . MVP (mitral valve prolapse)   . PONV (postoperative nausea and vomiting)   . Ventricular tachycardia, nonsustained [facility](HCC)     Past Surgical History:  Procedure Laterality Date  . ABDOMINAL HYSTERECTOMY    . CERVICAL CERCLAGE  2008  . CERVICAL CERCLAGE  09/08/2011   Procedure: CERCLAGE CERVICAL;   Surgeon: Lazaro ArmsLuther H Eure, MD;  Location: [facility];  Service: Gynecology;  Laterality: N/A;  [name],CST-scrubbed; doctor scrubbed in @ [phone]  . LAPAROSCOPIC LYSIS OF ADHESIONS  08/09/2012   Procedure: LAPAROSCOPIC LYSIS OF ADHESIONS;  Surgeon: Lazaro ArmsLuther H Eure, MD;  Location: [facility];  Service: Gynecology;  Laterality: N/A;  . LAPAROSCOPY  08/09/2012   Procedure: LAPAROSCOPY DIAGNOSTIC;  Surgeon: Lazaro ArmsLuther H Eure, MD;  Location: [facility];  Service: Gynecology;  Laterality: N/A;  started at  1208-Vaginal trachelectomy (vaginal removal of cervical stump)   . MYRINGOTOMY    . SUPRACERVICAL ABDOMINAL HYSTERECTOMY  01/26/2012   Procedure: HYSTERECTOMY SUPRACERVICAL ABDOMINAL;  Surgeon: Lazaro ArmsLuther H Eure, MD;  Location: [facility];  Service: Gynecology;;  converted [phone]    Current Outpatient Prescriptions  Medication Sig Dispense Refill  . flecainide (TAMBOCOR) 150 MG tablet Take 0.5 tablets (75 mg total) by mouth 2 (two) times daily. 30 tablet 10  . sodium chloride 1 g tablet Take 1 g by mouth daily.     No current facility-administered medications for this visit.     Allergies  Allergen Reactions  . Atenolol Other (See Comments)  . Carvedilol Hives and Itching  . Definity [Perflutren Lipid Microsphere] Other (See Comments)    Headache  . Hydrocodone-Acetaminophen Itching and Nausea And Vomiting  . Metoprolol     Headaches, dizzyness  . Morphine And Related Nausea And Vomiting  . Verapamil     Racing heart, dizzyness, headache,shaking all over her body   . Cardizem Cd [Diltiazem Hcl Er Beads] Rash  Review of Systems negative except from HPI and PMH  Physical Exam BP 116/80   Pulse (!) 116   Ht 5\' 3"  (1.6 m)   Wt 147 lb (66.7 kg)   LMP 05/04/2011   SpO2 97%   BMI 26.04 kg/m  Well developed and well nourished in no acute distress HENT normal E scleral and icterus clear Neck Supple JVP flat; carotids brisk and full Clear to ausculation  Regular rate and rhythm, no murmurs gallops or  rub Soft with active bowel sounds No clubbing cyanosis  Edema Alert and oriented, grossly normal motor and sensory function Skin Warm and Dry  Sinus at 110 13/0738 Otherwise normal  Assessment and  Plan  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 We can use Alive [employer]  We reviewed again extensively the issues of dysautonomia, the physiology of orthstasis and positional stress.     Stressed importance of aerobic and recumbant exercise.  The reporting of swollen and her house. She will start with poorly.  Suspect dyspnea is related to dysautonomia with inappropriate tachycardia. We will have her increase her salt supplementation intake from 1 ThermaTabs--2 twice a day;    She is now labeled is allergic to beta blockers. We will try her on corlanor    More than 50% of 25 min was spent in counseling related to the above    Current medicines are reviewed at length with the patient today .  The patient does not  have concerns regarding medicines.

## 2016-12-30 NOTE — Patient Instructions (Addendum)
Medication Instructions:  ** You are being given prescriptions for 3 different medicines to try. You may take them in any order, but DO NOT take more then 1 at a time. Once you start one, please try to give it at least 2 weeks on each one to see how you tolerate**  1) zebeta 5 mg- take one tablet by mouth once daily 2) bystolic 5 mg- take one tablet by mouth once daily 3) inderal LA 80 mg- take one tablet by mouth once daily   Labwork: None ordered  Testing/Procedures: None ordered  Follow-Up: Your physician wants you to follow-up in: 6 months with Dr. Graciela HusbandsKlein. You will receive a reminder letter in the mail two months in advance. If you don't receive a letter, please call our office to schedule the follow-up appointment.   Any Other Special Instructions Will Be Listed Below (If Applicable).     If you need a refill on your cardiac medications before your next appointment, please call your pharmacy.

## 2016-12-31 ENCOUNTER — Telehealth: Payer: Self-pay

## 2016-12-31 NOTE — Telephone Encounter (Signed)
**Note De-Identified Amesha Bailey Obfuscation** Received a fax requesting a PA on Propranolol from the pts pharmacy, [facility]. I called [employer] and spoke with Cicero DuckErika who advised me that Propranolol is covered under the pts plan and does not need a PA. Erika recommended that I call the pharmacy and ask them to use a different NDC# or to ask them to call [employer].  I called [facility] and passed on the info from ShubutaErika at [employer] [facility]. Per Melissa Propranolol still needs a PA done. I gave her Erika's name and advised her to call them and she stated that she would do so right now.

## 2017-01-11 ENCOUNTER — Ambulatory Visit: Payer: Medicaid Other | Admitting: Internal Medicine

## 2017-08-25 ENCOUNTER — Ambulatory Visit (INDEPENDENT_AMBULATORY_CARE_PROVIDER_SITE_OTHER): Payer: Medicaid Other | Admitting: Internal Medicine

## 2017-08-25 VITALS — BP 132/85 | HR 100 | Ht 63.0 in | Wt 151.0 lb

## 2017-08-25 DIAGNOSIS — I493 Ventricular premature depolarization: Secondary | ICD-10-CM | POA: Diagnosis not present

## 2017-08-25 DIAGNOSIS — G909 Disorder of the autonomic nervous system, unspecified: Secondary | ICD-10-CM | POA: Diagnosis not present

## 2017-08-25 DIAGNOSIS — R002 Palpitations: Secondary | ICD-10-CM

## 2017-08-25 NOTE — Progress Notes (Signed)
Patient Care Team: Oval Linsey, MD as PCP - General (Internal Medicine) Wyline Mood Dorothe Pea, MD as Consulting Physician (Cardiology) Marinus Maw, MD as Consulting Physician (Cardiology)   HPI  Tara Mahoney is a 32 y.o. female Seen in follow-up for Holter monitoring that showed  some PACs and PVCs and some nonsustained ventricular tachycardia. Multiple episodes of sinus tachycardia have also been identified. Structural cardiac evaluation included an echo and a stress echo which were reported as normal.  She was started on flecainide;  She does not think she is better on the higher dose of flecainide  however, she is having less of the irregular palpitations and more of the sustained tachycardia  8/17 underwent cardiac MR demonstrated ejection fraction of 48 percent and the issue was raised of non-compaction. A follow-up echo was performed looking for Doppler flow in the crypts; unfortunately, the Doppler studies were not done. Again non-compaction was entertained. I had a series of physicians look at these data. There is no consensus that non-compaction present.   Her mood has been considerably better.  She continues to complain of lack of energy.  She has some tachypalpitations.  She is showering at night.  She is amenorrheic.  Salt and water intake is reasonable.   Interval echocardiogram 3/18 demonstrated normal LV function    Past Medical History:  Diagnosis Date  . Blood transfusion without reported diagnosis   . Complication of anesthesia   . Dysautonomia    sinus arrythmia  . Family history of anesthesia complication    PONV  . Headache(784.0)   . Heart murmur   . MVP (mitral valve prolapse)   . PONV (postoperative nausea and vomiting)   . Ventricular tachycardia, nonsustained [facility])     Past Surgical History:  Procedure Laterality Date  . ABDOMINAL HYSTERECTOMY    . CERVICAL CERCLAGE  2008  . CERVICAL CERCLAGE  09/08/2011   Procedure: CERCLAGE  CERVICAL;  Surgeon: Lazaro Arms, MD;  Location: [facility];  Service: Gynecology;  Laterality: N/A;  [name],CST-scrubbed; doctor scrubbed in @ [phone]  . LAPAROSCOPIC LYSIS OF ADHESIONS  08/09/2012   Procedure: LAPAROSCOPIC LYSIS OF ADHESIONS;  Surgeon: Lazaro Arms, MD;  Location: [facility];  Service: Gynecology;  Laterality: N/A;  . LAPAROSCOPY  08/09/2012   Procedure: LAPAROSCOPY DIAGNOSTIC;  Surgeon: Lazaro Arms, MD;  Location: [facility];  Service: Gynecology;  Laterality: N/A;  started at  1208-Vaginal trachelectomy (vaginal removal of cervical stump)   . MYRINGOTOMY    . SUPRACERVICAL ABDOMINAL HYSTERECTOMY  01/26/2012   Procedure: HYSTERECTOMY SUPRACERVICAL ABDOMINAL;  Surgeon: Lazaro Arms, MD;  Location: [facility];  Service: Gynecology;;  converted [phone]    Current Outpatient Medications  Medication Sig Dispense Refill  . flecainide (TAMBOCOR) 150 MG tablet Take 0.5 tablets (75 mg total) by mouth 2 (two) times daily. 30 tablet 10  . sodium chloride 1 g tablet Take 1 g by mouth daily.     No current facility-administered medications for this visit.     Allergies  Allergen Reactions  . Atenolol Other (See Comments)  . Carvedilol Hives and Itching  . Definity [Perflutren Lipid Microsphere] Other (See Comments)    Headache  . Hydrocodone-Acetaminophen Itching and Nausea And Vomiting  . Metoprolol     Headaches, dizzyness  . Morphine And Related Nausea And Vomiting  . Verapamil     Racing heart, dizzyness, headache,shaking all over her body   . 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  Partly related to weather, and partly not.  I have encouraged her to pursue aerobic exercise.  Mood is lighter.  She does not think she is depressed currently.  Next year we can anticipate repeat imaging to look for evidence of progression of her cardiomyopathic process  More than 50% of 25 min was spent in counseling related to the above    Current medicines are reviewed at length with the patient today .  The patient does not  have concerns regarding medicines.

## 2017-08-25 NOTE — Patient Instructions (Addendum)
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
## 2018-03-02 ENCOUNTER — Ambulatory Visit (INDEPENDENT_AMBULATORY_CARE_PROVIDER_SITE_OTHER): Payer: Medicaid Other | Admitting: Internal Medicine

## 2018-03-02 ENCOUNTER — Encounter: Payer: Self-pay | Admitting: Internal Medicine

## 2018-03-02 VITALS — BP 134/85 | HR 91 | Ht 63.0 in | Wt 150.6 lb

## 2018-03-02 DIAGNOSIS — I493 Ventricular premature depolarization: Secondary | ICD-10-CM

## 2018-03-02 DIAGNOSIS — G909 Disorder of the autonomic nervous system, unspecified: Secondary | ICD-10-CM

## 2018-03-02 DIAGNOSIS — I429 Cardiomyopathy, unspecified: Secondary | ICD-10-CM

## 2018-03-02 MED ORDER — NEBIVOLOL HCL 2.5 MG PO TABS: 2.5000 mg | ORAL_TABLET | Freq: Every day | ORAL | 0 refills | Status: DC

## 2018-03-02 MED ORDER — PROPRANOLOL HCL ER 60 MG PO CP24: 60.0000 mg | ORAL_CAPSULE | Freq: Every day | ORAL | 0 refills | Status: DC

## 2018-03-02 NOTE — Progress Notes (Signed)
Patient Care Team: Oval Linseyondiego, Richard, MD as PCP - General (Internal Medicine) Wyline MoodBranch, Dorothe PeaJonathan F, MD as Consulting Physician (Cardiology) Marinus Mawaylor, Gregg W, MD as Consulting Physician (Cardiology)   HPI  Tara Mahoney is a 32 y.o. female Seen in follow-up for Holter monitoring that showed  some PACs and PVCs and some nonsustained ventricular tachycardia. Multiple episodes of sinus tachycardia have also been identified. Structural cardiac evaluation included an echo and a stress echo which were reported as normal.  She was started on flecainide;  She does not think she is better on the higher dose of flecainide  however, she is having less of the irregular palpitations and more of the sustained tachycardia  8/17 underwent cardiac MR demonstrated ejection fraction of 48 percent and the issue was raised of non-compaction. A follow-up echo was performed looking for Doppler flow in the crypts; unfortunately, the Doppler studies were not done. Again non-compaction was entertained. I had a series of physicians look at these data. There is no consensus that non-compaction present.     Her mood has been considerably better.  She continues to complain of lack of energy.  She has some tachypalpitations.  She is showering at night.  She is amenorrheic.  Salt and water intake is reasonable.   Her mood is much improved.  This is true not withstanding significant worsening of symptoms over the last 6-8 weeks.  Including orthostatic lightheadedness, shower intolerance and particularly dyspnea and lightheadedness with exertion associate with tachypalpitations she remains salt and fluid replete  Interval echocardiogram 3/18 demonstrated normal LV function    Past Medical History:  Diagnosis Date  . Blood transfusion without reported diagnosis   . Complication of anesthesia   . Dysautonomia (HCC)    sinus arrythmia  . Family history of anesthesia complication    PONV  . Headache(784.0)   .  Heart murmur   . MVP (mitral valve prolapse)   . PONV (postoperative nausea and vomiting)   . Ventricular tachycardia, nonsustained [facility](HCC)     Past Surgical History:  Procedure Laterality Date  . ABDOMINAL HYSTERECTOMY    . CERVICAL CERCLAGE  2008  . CERVICAL CERCLAGE  09/08/2011   Procedure: CERCLAGE CERVICAL;  Surgeon: Lazaro ArmsLuther H Eure, MD;  Location: [facility];  Service: Gynecology;  Laterality: N/A;  [name],CST-scrubbed; doctor scrubbed in @ [phone]  . LAPAROSCOPIC LYSIS OF ADHESIONS  08/09/2012   Procedure: LAPAROSCOPIC LYSIS OF ADHESIONS;  Surgeon: Lazaro ArmsLuther H Eure, MD;  Location: [facility];  Service: Gynecology;  Laterality: N/A;  . LAPAROSCOPY  08/09/2012   Procedure: LAPAROSCOPY DIAGNOSTIC;  Surgeon: Lazaro ArmsLuther H Eure, MD;  Location: [facility];  Service: Gynecology;  Laterality: N/A;  started at  1208-Vaginal trachelectomy (vaginal removal of cervical stump)   . MYRINGOTOMY    . SUPRACERVICAL ABDOMINAL HYSTERECTOMY  01/26/2012   Procedure: HYSTERECTOMY SUPRACERVICAL ABDOMINAL;  Surgeon: Lazaro ArmsLuther H Eure, MD;  Location: [facility];  Service: Gynecology;;  converted [phone]    Current Outpatient Medications  Medication Sig Dispense Refill  . flecainide (TAMBOCOR) 150 MG tablet Take 0.5 tablets (75 mg total) by mouth 2 (two) times daily. 30 tablet 10  . sodium chloride 1 g tablet Take 1 g by mouth daily.     No current facility-administered medications for this visit.     Allergies  Allergen Reactions  . Atenolol Other (See Comments)  . Carvedilol Hives and Itching  . Definity [Perflutren Lipid Microsphere] Other (See Comments)    Headache  . Hydrocodone-Acetaminophen  Itching and Nausea And Vomiting  . Metoprolol     Headaches, dizzyness  . Morphine And Related Nausea And Vomiting  . Verapamil     Racing heart, dizzyness, headache,shaking all over her body   . 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  Continues with dyspnea and tachypalpitations.  Significant tachycardia response position today.  He has been intolerant to numerous beta-blockers including metoprolol succinate and tartrate, bisoprolol and atenolol.  We will try again with propranolol and nebivolol.  Hopefully we can attenuate her heart rate with exertion and thus improve exercise tolerance.  Her blood pressure is borderline elevated.  Current medicines are reviewed at length with the patient today .  The patient does not  have concerns regarding medicines.

## 2018-03-02 NOTE — Patient Instructions (Signed)
Medication Instructions:  Your physician has recommended you make the following change in your medication:   You are being given two prescriptions for different beta blockers to try.  You may take these in any order. DO NOT TAKE MORE THAN ONE BETA BLOCKER AT A TIME. Choose one beta blocker to start with. If after two weeks, you feel the medication is working well for you, continue that current medication. If it does not work well for you, try another prescription for a beta blocker at that time. You may continue that current beta blocker at that time if it works well for you.  When you find a beta blocker that works well for you, call the office and we will send in a prescription for you.               1. Bystolic 2.5mg  tablet, once per day             2. Propanolol LA 60mg  tablet once per day  You may take these in any order you please.   Please call us with any questions.   Labwork: None ordered.  Testing/Procedures: None ordered.  Follow-Up: Your physician wants you to follow-up in: 2 months with Francis Dowseenee Ursuy, PA. You will receive a reminder letter in the mail two months in advance. If you don't receive a letter, please call our office to schedule the follow-up appointment.   Any Other Special Instructions Will Be Listed Below (If Applicable).     If you need a refill on your cardiac medications before your next appointment, please call your pharmacy.

## 2018-04-30 NOTE — Progress Notes (Signed)
Cardiology Office Note Date:  04/30/2018  Patient ID:  Tara Mahoney, Tara Mahoney [birth year], MRN [id number] PCP:  Oval Linsey, MD  Cardiologist:  Dr. Wyline Mood Electrophysiologist: Dr. Graciela Husbands   Chief Complaint: planned 71mo EP f/u  History of Present Illness: Tara Mahoney is a 32 y.o. female with history of Dysautonomia/POTS, palpitations, PACs, ST, PVCs, NSVT.  She comes in today to be seen for Dr.Klein, last seen by him Aug 2019, at that visit he discussed  8/17 underwent cardiac MR demonstrated ejection fraction of 48 percent and the issue was raised of non-compaction. A follow-up echo was performed looking for Doppler flow in the crypts; unfortunately, the Doppler studies were not done. Again non-compaction was entertained. I had a series of physicians look at these data. There is no consensus that non-compaction present.  "Her mood is much improved.  This is true not withstanding significant worsening of symptoms over the last 6-8 weeks.  Including orthostatic lightheadedness, shower intolerance and particularly dyspnea and lightheadedness with exertion associate with tachypalpitations she remains salt and fluid replete Interval echocardiogram 3/18 demonstrated normal LV function" Mentions that at that visitnoted significant tachycardic response to position change, and historically, intolerant to numerous beta-blockers including metoprolol succinate and tartrate, bisoprolol and atenolol.  Attempts to BB were planned with either propanolol or nebivolol in hopes her HR would improve and therefore improve her exercise tolerance.  She is doing well.  Feels like she has been seeing Dr. Graciela Husbands has learned a lot about her symptoms/syndrome and much more empowered with tools to help.  She will still have symptoms though are better controlled.  She is walking for exercise, feels like her exertional capacity is slowly improving.  Unable to tolerate the beta blockers with rash/itching.  She will get a  little lightheaded with position change and activity, can feel her HR up-tick, though has not had near syncope or syncope.  She is taking salt tab daily and very good about her water intake.    AAD Hx: Flecainide (current)   Past Medical History:  Diagnosis Date  . Blood transfusion without reported diagnosis   . Complication of anesthesia   . Dysautonomia (HCC)    sinus arrythmia  . Family history of anesthesia complication    PONV  . Headache(784.0)   . Heart murmur   . MVP (mitral valve prolapse)   . PONV (postoperative nausea and vomiting)   . Ventricular tachycardia, nonsustained [facility])     Past Surgical History:  Procedure Laterality Date  . ABDOMINAL HYSTERECTOMY    . CERVICAL CERCLAGE  2008  . CERVICAL CERCLAGE  09/08/2011   Procedure: CERCLAGE CERVICAL;  Surgeon: Lazaro Arms, MD;  Location: [facility];  Service: Gynecology;  Laterality: N/A;  [name],CST-scrubbed; doctor scrubbed in @ [phone]  . LAPAROSCOPIC LYSIS OF ADHESIONS  08/09/2012   Procedure: LAPAROSCOPIC LYSIS OF ADHESIONS;  Surgeon: Lazaro Arms, MD;  Location: [facility];  Service: Gynecology;  Laterality: N/A;  . LAPAROSCOPY  08/09/2012   Procedure: LAPAROSCOPY DIAGNOSTIC;  Surgeon: Lazaro Arms, MD;  Location: [facility];  Service: Gynecology;  Laterality: N/A;  started at  1208-Vaginal trachelectomy (vaginal removal of cervical stump)   . MYRINGOTOMY    . SUPRACERVICAL ABDOMINAL HYSTERECTOMY  01/26/2012   Procedure: HYSTERECTOMY SUPRACERVICAL ABDOMINAL;  Surgeon: Lazaro Arms, MD;  Location: [facility];  Service: Gynecology;;  converted [phone]    Current Outpatient Medications  Medication Sig Dispense Refill  . flecainide (TAMBOCOR) 150 MG tablet Take 0.5 tablets (75  mg total) by mouth 2 (two) times daily. 30 tablet 10  . nebivolol (BYSTOLIC) 2.5 MG tablet Take 1 tablet (2.5 mg total) by mouth daily. 30 tablet 0  . propranolol ER (INDERAL LA) 60 MG 24 hr capsule Take 1 capsule (60 mg total) by mouth daily. 30  capsule 0  . sodium chloride 1 g tablet Take 1 g by mouth daily.     No current facility-administered medications for this visit.     Allergies:   Atenolol; Carvedilol; Definity [perflutren lipid microsphere]; Hydrocodone-acetaminophen; Metoprolol; Morphine and related; Verapamil; and Cardizem cd [diltiazem hcl er beads]   Social History:  The patient  reports that she quit smoking about 8 years ago. Her smoking use included cigarettes. She started smoking about 16 years ago. She quit after 1.00 year of use. She has never used smokeless tobacco. She reports that she does not drink alcohol or use drugs.   Family History:  The patient's family history includes Heart disease in her brother; Hypertension in her brother; Ovarian cancer in her mother.  ROS:  Please see the history of present illness.  All other systems are reviewed and otherwise negative.   PHYSICAL EXAM:  VS:  LMP 05/04/2011  BMI: There is no height or weight on file to calculate BMI. Well nourished, well developed, in no acute distress  HEENT: normocephalic, atraumatic  Neck: no JVD, carotid bruits or masses Cardiac:   RRR; no significant murmurs, no rubs, or gallops Lungs:  CTA b/l, no wheezing, rhonchi or rales  Abd: soft, nontender MS: no deformity or atrophy Ext: no edema  Skin: warm and dry, no rash Neuro:  No gross deficits appreciated Psych: euthymic mood, full affect   EKG:  Done today and reviewed by myself is SR 80bpm, PR , QRS 74ms, QTc   01/2014 Stress echo Study Conclusions - Stress ECG conclusions: The stress ECG was normal. Duke treadmill score of 10, consistent with low risk for major cardiac events. - Staged echo: Normal echo stress - From notes patient is on flecanide. There is no QRS widening with exercise. Impressions: - Normal study after maximal exercise.   03/2013 echo Study Conclusions - Study data: Technically adequate study - Left ventricle: The cavity size was  normal. Wall thickness was normal. Systolic function was normal. The estimated ejection fraction was in the range of 55% to 60%. Wall motion was normal; there were no regional wall motion abnormalities. Left ventricular diastolic function parameters were normal. - Aortic valve: Valve area: 1.7cm^2(VTI). Valve area: 1.65cm^2 (Vmax). - Mitral valve: Prominent somewhat redundant anterior leaflet. Smaller more dimunitive posterior leaflet. Mild regurgitation. The MR vena contracta is 0.3 cm. The jet is eccentric and posteriorly directed. - Left atrium: The atrium was normal in size.  05/2013 GXT No ischemic changes, no QRS prolongation   04/2013 Holter monitor 6 beat run of NSVT that was symptomatic. Other symptoms correlated with NSR/  05/2013 Holter No significant arrhythmias   05/2014 7 day monitor -symptoms correlate with NSR and sinus tach.   09/2014 Cardiac CT IMPRESSION: 1. Coronary calcium score of 0.5. This was 60 percentile for age and sex matched control. 2. Normal coronary origin. Right dominance. 3. Minimal calcified plaque in the proximal RCA, otherwise normal coronaries. 4. Present PFO.    12/2015 Event monitor  Telemetry tracings show sinus rhythm throughout study   Symptoms correlate with normal sinus rhtythm and sinus tachycardia   No significant arrhythmias    Recent Labs: No results found  for requested labs within last 8760 hours.  No results found for requested labs within last 8760 hours.   CrCl cannot be calculated (Patient's most recent lab result is older than the maximum 21 days allowed.).   Wt Readings from Last 3 Encounters:  03/02/18 150 lb 9.6 oz (68.3 kg)  08/25/17 151 lb (68.5 kg)  12/30/16 147 lb (66.7 kg)     Other studies reviewed: Additional studies/records reviewed today include: summarized above  ASSESSMENT AND PLAN:  1. Dysautonomia 2. POTS 3. PAC's/ST 4. PVCs/NSVT  Orthostatic  today + for increased HR, BP OK  On flecainide, intolerant of BB as well as CCB, (rash/itching) Stable EKG intervals  She seems to have a handle on her symptoms and strategies for symptom management We reviewed safety, symptom recognition, discussed while she had a partial hysterectomy and does not menstruate, she still has ovaries and hormonal fluctuations and be aware that during the time that she would otherwise have her period she is at increased susceptibility to symptoms.    Disposition: F/u with in 6 mo, sooner if needed  Current medicines are reviewed at length with the patient today.  The patient did not have any concerns regarding medicines.  Norma Fredrickson, PA-C 04/30/2018 12:27 PM     [facility] [address] [place] [address] [phone] (office)  [phone] (fax)

## 2018-05-02 ENCOUNTER — Ambulatory Visit: Payer: Medicaid Other | Admitting: Physician Assistant

## 2018-05-02 ENCOUNTER — Encounter: Payer: Self-pay | Admitting: *Deleted

## 2018-05-02 VITALS — BP 135/85 | HR 80 | Ht 63.0 in | Wt 150.0 lb

## 2018-05-02 DIAGNOSIS — Z79899 Other long term (current) drug therapy: Secondary | ICD-10-CM

## 2018-05-02 DIAGNOSIS — I951 Orthostatic hypotension: Secondary | ICD-10-CM

## 2018-05-02 DIAGNOSIS — I493 Ventricular premature depolarization: Secondary | ICD-10-CM

## 2018-05-02 DIAGNOSIS — I472 Ventricular tachycardia: Secondary | ICD-10-CM

## 2018-05-02 DIAGNOSIS — I4729 Other ventricular tachycardia: Secondary | ICD-10-CM

## 2018-05-02 DIAGNOSIS — R000 Tachycardia, unspecified: Secondary | ICD-10-CM | POA: Diagnosis not present

## 2018-05-02 DIAGNOSIS — G901 Familial dysautonomia [Riley-Day]: Secondary | ICD-10-CM | POA: Diagnosis not present

## 2018-05-02 DIAGNOSIS — G90A Postural orthostatic tachycardia syndrome (POTS): Secondary | ICD-10-CM

## 2018-05-02 NOTE — Progress Notes (Signed)
error 

## 2018-05-02 NOTE — Patient Instructions (Signed)
Medication Instructions:   Your physician recommends that you continue on your current medications as directed. Please refer to the Current Medication list given to you today.   If you need a refill on your cardiac medications before your next appointment, please call your pharmacy.  Labwork:  NONE ORDERED  TODAY    Testing/Procedures: NONE ORDERED  TODAY    Follow-Up:  Your physician wants you to follow-up in:  IN  6  MONTHS WITH DR KLEIN  You will receive a reminder letter in the mail two months in advance. If you don't receive a letter, please call our office to schedule the follow-up appointment.      Any Other Special Instructions Will Be Listed Below (If Applicable).                                                                                                                                                   

## 2018-10-26 ENCOUNTER — Emergency Department (HOSPITAL_COMMUNITY)
Admission: EM | Admit: 2018-10-26 | Discharge: 2018-10-26 | Disposition: A | Discharge status: Home / Self Care | Payer: Medicaid Other | Attending: Emergency Medicine | Admitting: Emergency Medicine

## 2018-10-26 ENCOUNTER — Encounter (HOSPITAL_COMMUNITY): Payer: Self-pay

## 2018-10-26 ENCOUNTER — Other Ambulatory Visit: Payer: Self-pay

## 2018-10-26 DIAGNOSIS — Z79899 Other long term (current) drug therapy: Secondary | ICD-10-CM | POA: Insufficient documentation

## 2018-10-26 DIAGNOSIS — H1032 Unspecified acute conjunctivitis, left eye: Secondary | ICD-10-CM | POA: Insufficient documentation

## 2018-10-26 DIAGNOSIS — Z87891 Personal history of nicotine dependence: Secondary | ICD-10-CM | POA: Insufficient documentation

## 2018-10-26 MED ORDER — TETRACAINE HCL 0.5 % OP SOLN
1.0000 [drp] | Freq: Once | OPHTHALMIC | Status: AC
  Administered 2018-10-26: 22:00:00 1 [drp] via OPHTHALMIC
  Filled 2018-10-26: qty 4

## 2018-10-26 MED ORDER — NEOMYCIN-POLYMYXIN-HC 3.5-10000-1 OP SUSP: 2.0000 [drp] | Freq: Four times a day (QID) | OPHTHALMIC | 0 refills | Status: DC

## 2018-10-26 MED ORDER — FLUORESCEIN SODIUM 1 MG OP STRP
1.0000 | ORAL_STRIP | Freq: Once | OPHTHALMIC | Status: AC
  Administered 2018-10-26: 22:00:00 1 via OPHTHALMIC
  Filled 2018-10-26: qty 1

## 2018-10-26 NOTE — ED Provider Notes (Signed)
[facility] EMERGENCY DEPARTMENT Provider Note   CSN: [id number] Arrival date & time: 10/26/18  2055    History   Chief Complaint Chief Complaint  Patient presents with   Eye Pain    swelling, redness, left eye    HPI Tara Mahoney is a 33 y.o. female with history of dysautonomia presenting to emergency department today with chief complaint of left eye pain x1 day.  Patient states yesterday while taking in the groceries there was a gust of wind that blew up pollen and she feels like she got some in her left eye. She rubbed her eye and it felt better.  When patient woke up this morning her left eye was itching.  States her eye feels sore when touched.  Throughout the day she noticed swelling to the left upper eye lid.  She reports watery discharge throughout the day.  She tried to do at home eye rinse but it caused the pain to be worse. Pt does not wear contacts. She denies foreign body sensation in left eye.  Pt denies headache, fever, chest pain, shortness of breath.  History provided by patient.    Past Medical History:  Diagnosis Date   Blood transfusion without reported diagnosis    Complication of anesthesia    Dysautonomia (HCC)    sinus arrythmia   Family history of anesthesia complication    PONV   Headache(784.0)    Heart murmur    MVP (mitral valve prolapse)    PONV (postoperative nausea and vomiting)    Ventricular tachycardia, nonsustained (HCC)     Patient Active Problem List   Diagnosis Date Noted   Autonomic dysfunction 03/20/2015   Chest pain 02/27/2014   Sinus tachycardia 02/06/2014   PVC (premature ventricular contraction) 05/09/2013   PAC (premature atrial contraction) 05/09/2013   Nonsustained ventricular tachycardia (HCC) 05/09/2013   Abdominal pain, left lower quadrant 02/20/2013   Mitral valve disorder 09/23/2010   PALPITATIONS 09/23/2010    Past Surgical History:  Procedure Laterality Date   ABDOMINAL HYSTERECTOMY       CERVICAL CERCLAGE  2008   CERVICAL CERCLAGE  09/08/2011   Procedure: CERCLAGE CERVICAL;  Surgeon: Lazaro ArmsLuther H Eure, MD;  Location: [facility];  Service: Gynecology;  Laterality: N/A;  [name],CST-scrubbed; doctor scrubbed in @ [phone]   LAPAROSCOPIC LYSIS OF ADHESIONS  08/09/2012   Procedure: LAPAROSCOPIC LYSIS OF ADHESIONS;  Surgeon: Lazaro ArmsLuther H Eure, MD;  Location: [facility];  Service: Gynecology;  Laterality: N/A;   LAPAROSCOPY  08/09/2012   Procedure: LAPAROSCOPY DIAGNOSTIC;  Surgeon: Lazaro ArmsLuther H Eure, MD;  Location: [facility];  Service: Gynecology;  Laterality: N/A;  started at  1208-Vaginal trachelectomy (vaginal removal of cervical stump)    MYRINGOTOMY     SUPRACERVICAL ABDOMINAL HYSTERECTOMY  01/26/2012   Procedure: HYSTERECTOMY SUPRACERVICAL ABDOMINAL;  Surgeon: Lazaro ArmsLuther H Eure, MD;  Location: [facility];  Service: Gynecology;;  converted [phone]     OB History    Gravida  2   Para  2   Term  1   Preterm  1   AB      Living  2     SAB      TAB      Ectopic      Multiple      Live Births  1            Home Medications    Prior to Admission medications   Medication Sig Start Date End Date Taking? Authorizing Provider  Carboxymethylcellulose  Sodium (EYE DROPS OP) Apply 1-2 drops to eye once. For redness/eye irritation   Yes [provider]  Multiple Vitamin (MULTIVITAMIN WITH MINERALS) TABS tablet Take 1 tablet by mouth daily.   Yes [provider]  neomycin-polymyxin-hydrocortisone (CORTISPORIN) 3.5-10000-1 ophthalmic suspension Place 2 drops into the left eye 4 (four) times daily. 10/26/18   Deyton Ellenbecker, Caroleen Hamman, PA-C    Family History Family History  Problem Relation Age of Onset   Hypertension Brother    Heart disease Brother        hole in heart   Ovarian cancer Mother    Anesthesia problems Neg Hx    Hypotension Neg Hx    Pseudochol deficiency Neg Hx    Malignant hyperthermia Neg Hx    Other Neg Hx     Social History Social History    Tobacco Use   Smoking status: Former Smoker    Years: 1.00    Types: Cigarettes    Start date: 10/01/2001    Last attempt to quit: 09/01/2009    Years since quitting: 9.1   Smokeless tobacco: Never Used   Tobacco comment: smokes 1 pack a week  Substance Use Topics   Alcohol use: No    Alcohol/week: 0.0 standard drinks   Drug use: No     Allergies   Carvedilol; Definity [perflutren lipid microsphere]; Hydrocodone-acetaminophen; Morphine and related; Verapamil; Atenolol; Cardizem cd [diltiazem hcl er beads]; Metoprolol; and Nebivolol   Review of Systems Review of Systems  Constitutional: Negative for chills and fever.  HENT: Negative for congestion, ear discharge, ear pain, sinus pressure, sinus pain and sore throat.   Eyes: Positive for pain, discharge, redness and itching.  Respiratory: Negative for cough and shortness of breath.   Cardiovascular: Negative for chest pain.  Gastrointestinal: Negative for abdominal pain, constipation, diarrhea, nausea and vomiting.  Genitourinary: Negative for dysuria and hematuria.  Musculoskeletal: Negative for back pain and neck pain.  Skin: Negative for wound.  Neurological: Negative for weakness, numbness and headaches.     Physical Exam Updated Vital Signs BP (!) 151/100 (BP Location: Right Arm)    Pulse 98    Temp 98.8 F (37.1 C) (Oral)    Resp 16    LMP 05/04/2011    SpO2 100%   Physical Exam Vitals signs and nursing note reviewed.  Constitutional:      General: She is not in acute distress.    Appearance: She is well-developed. She is not ill-appearing or toxic-appearing.  HENT:     Head: Normocephalic and atraumatic.     Comments: Head is nontender to palpation. No sinus or temporal tenderness. Mild swelling to left upper eyelid. Watery drainage from left eye     Right Ear: Tympanic membrane, ear canal and external ear normal.     Left Ear: Tympanic membrane, ear canal and external ear normal.  Eyes:     General:  Lids are normal. No scleral icterus.       Right eye: No discharge.        Left eye: No discharge.     Extraocular Movements: Extraocular movements intact.     Conjunctiva/sclera: Conjunctivae normal.     Left eye: Left conjunctiva is not injected.     Comments: Pupils are equal and reactive to light.  Left cornea appears normal. Woods lamp exam with no evidence of fluorescein uptake, dendritic lesions, or corneal abrasions. No seidel sign.  Neck:     Musculoskeletal: Normal range of motion.  Cardiovascular:  Rate and Rhythm: Normal rate and regular rhythm.     Pulses: Normal pulses.     Heart sounds: Normal heart sounds.  Pulmonary:     Effort: Pulmonary effort is normal.  Abdominal:     General: There is no distension.  Musculoskeletal: Normal range of motion.  Skin:    General: Skin is warm and dry.  Neurological:     Mental Status: She is oriented to person, place, and time.     Comments: Fluent speech, no facial droop.  Psychiatric:        Behavior: Behavior normal.      ED Treatments / Results  Labs (all labs ordered are listed, but only abnormal results are displayed) Labs Reviewed - No data to display  EKG None  Radiology No results found.  Procedures Procedures (including critical care time)  Medications Ordered in ED Medications  tetracaine (PONTOCAINE) 0.5 % ophthalmic solution 1 drop (1 drop Both Eyes Given 10/26/18 2206)  fluorescein ophthalmic strip 1 strip (1 strip Left Eye Given 10/26/18 2207)     Initial Impression / Assessment and Plan / ED Course  I have reviewed the triage vital signs and the nursing notes.  Pertinent labs & imaging results that were available during my care of the patient were reviewed by me and considered in my medical decision making (see chart for details).   Patient presentation consistent with viral vs allergic vs bacterial conjunctivitis.  No purulent discharge, corneal abrasions, entrapment, consensual photophobia,  or dendritic staining with fluorescein study.  Presentation non-concerning for iritis, corneal abrasions, or HSV.  After the exam pt states her blurry vision and pain improved. Pt has had conjunctivitis in the past and her symptoms are the same today as they were then. She saw her ophthalmologist and was prescribed eye drops.  Personal hygiene and frequent handwashing discussed.  Patient advised to followup with ophthalmologist if symptoms persist or worsen in any way including vision change or purulent discharge. Will discharge with prescription for eye drops. Patient verbalizes understanding and is agreeable with discharge.  Patient's blood pressure elevated in the emergency department today. Patient denies headache, numbness, weakness, chest pain, dyspnea, dizziness, or lightheadedness therefore doubt hypertensive emergency. Discussed elevated blood pressure with the patient and the need for primary care follow up with potential need to initiate or change antihypertensive medications and/or for further evaluation. Discussed return precaution signs/symptoms for hypertensive emergency as listed above with the patient. She confirmed understanding.     Vitals:   10/26/18 2100 10/26/18 2258  BP: (!) 151/100 (!) 143/96  Pulse: 98 78  Resp: 16 16  Temp: 98.8 F (37.1 C) 98.3 F (36.8 C)  TempSrc: Oral Oral  SpO2: 100% 96%    This note was prepared with assistance of Dragon voice recognition software. Occasional wrong-word or sound-a-like substitutions may have occurred due to the inherent limitations of voice recognition software.   Final Clinical Impressions(s) / ED Diagnoses   Final diagnoses:  None    ED Discharge Orders         Ordered    neomycin-polymyxin-hydrocortisone (CORTISPORIN) 3.5-10000-1 ophthalmic suspension  4 times daily     10/26/18 2259           Sherene Sires, PA-C 10/27/18 0002    Maia Plan, MD 10/27/18 [phone]

## 2018-10-26 NOTE — Discharge Instructions (Addendum)
Please read and follow all provided instructions.  Your diagnosis today includes: Conjunctivitis  Listed in your discharge instructions is an ophthalmologist (eye doctor) to schedule a follow up appointment with.  Follow-up care is necessary to be sure the infection is healing if not completely resolved in 2-3 days. See your caregiver or eye specialist as suggested for follow-up. Read the instructions below and make sure you understand reasons to return to the Emergency Department.   -Prescription sent to your pharmacy for eye drops, please use as prescribed.  Tests performed today include: Visual acuity testing to check your vision Fluorescein dye examination to look for scratches on your eye    Conjunctivitis  Conjunctivitis is commonly called "pink eye." Conjunctivitis can be caused by bacterial or viral infection, allergies, or injuries. There is usually redness of the lining of the eye, itching, discomfort, and sometimes discharge. Pink eye is very contagious and spreads by direct contact. Please avoid spreading this to other persons. Do not rub your eye. This increases the irritation and helps spread infection. Use separate towels from other household members. Wash your hands with soap and water before and after touching your eyes. You may be given antibiotic eyedrops as part of your treatment. Before using your eye medicine, remove all drainage from the eye by washing gently with warm water and cotton balls. Continue to use the medication until you have awakened 2 mornings in a row without discharge from the eye or as directed on the drops instructions. Use cold compresses to reduce pain and sunglasses to relieve irritation from light. Do not wear contact lenses or wear eye makeup until the infection is gone.  SEEK MEDICAL CARE IF:  Your symptoms are not better after 3 days of treatment.  You have increased pain or trouble seeing.  The outer eyelids become very red or swollen.  You  develop double vision or your vision becomes blurred or worsens in any way.  You have trouble moving your eyes.  The eye looks like it is popping out (this is called proptosis).  You develop a severe headache, severe neck pain, or neck stiffness.  You develop repeated vomiting.  You have a fever (>100.538F) or persistent symptoms for more than 72 hours.  You have a fever (>100.538F) and your symptoms suddenly get worse.  Additional Information:

## 2018-10-26 NOTE — ED Triage Notes (Signed)
Pt reports left eye pain, swelling, and redness that started today.

## 2018-11-03 ENCOUNTER — Other Ambulatory Visit: Payer: Self-pay | Admitting: Internal Medicine

## 2018-11-03 NOTE — Telephone Encounter (Signed)
Pt's pharmacy is requesting a refill on flecainide. This medication is no longer on pt's medication list and pt stated, when I called her, that she is still taking this medication. Please address

## 2020-02-18 ENCOUNTER — Other Ambulatory Visit: Payer: Self-pay | Admitting: Nurse Practitioner

## 2020-02-18 DIAGNOSIS — U071 COVID-19: Secondary | ICD-10-CM

## 2020-02-18 DIAGNOSIS — E663 Overweight: Secondary | ICD-10-CM

## 2020-02-18 NOTE — Progress Notes (Signed)
I connected by phone with Tara Mahoney on 02/18/2020 at 1:08 PM to discuss the potential use of a new treatment for mild to moderate COVID-19 viral infection in non-hospitalized patients.  This patient is a 34 y.o. female that meets the FDA criteria for Emergency Use Authorization of COVID monoclonal antibody casirivimab/imdevimab.  Has a (+) direct SARS-CoV-2 viral test result  Has mild or moderate COVID-19   Is NOT hospitalized due to COVID-19  Is within 10 days of symptom onset  Has at least one of the high risk factor(s) for progression to severe COVID-19 and/or hospitalization as defined in EUA.  Specific high risk criteria : BMI > 25   I have spoken and communicated the following to the patient or parent/caregiver regarding COVID monoclonal antibody treatment:  1. FDA has authorized the emergency use for the treatment of mild to moderate COVID-19 in adults and pediatric patients with positive results of direct SARS-CoV-2 viral testing who are 87 years of age and older weighing at least 40 kg, and who are at high risk for progressing to severe COVID-19 and/or hospitalization.  2. The significant known and potential risks and benefits of COVID monoclonal antibody, and the extent to which such potential risks and benefits are unknown.  3. Information on available alternative treatments and the risks and benefits of those alternatives, including clinical trials.  4. Patients treated with COVID monoclonal antibody should continue to self-isolate and use infection control measures (e.g., wear mask, isolate, social distance, avoid sharing personal items, clean and disinfect "high touch" surfaces, and frequent handwashing) according to CDC guidelines.   5. The patient or parent/caregiver has the option to accept or refuse COVID monoclonal antibody treatment.  After reviewing this information with the patient, The patient agreed to proceed with receiving casirivimab\imdevimab infusion and  will be provided a copy of the Fact sheet prior to receiving the infusion. Tara Mahoney 02/18/2020 1:08 PM

## 2020-02-19 ENCOUNTER — Ambulatory Visit (HOSPITAL_COMMUNITY)
Admission: RE | Admit: 2020-02-19 | Discharge: 2020-02-19 | Disposition: A | Discharge status: Home / Self Care | Payer: HRSA Program | Source: Ambulatory Visit | Attending: Pulmonary Disease | Admitting: Pulmonary Disease

## 2020-02-19 DIAGNOSIS — U071 COVID-19: Secondary | ICD-10-CM

## 2020-02-19 DIAGNOSIS — E663 Overweight: Secondary | ICD-10-CM | POA: Diagnosis not present

## 2020-02-19 MED ORDER — FAMOTIDINE IN NACL 20-0.9 MG/50ML-% IV SOLN: 20.0000 mg | Freq: Once | INTRAVENOUS | Status: DC | PRN

## 2020-02-19 MED ORDER — SODIUM CHLORIDE 0.9 % IV SOLN
Freq: Once | INTRAVENOUS | Status: AC
  Filled 2020-02-19: qty 600

## 2020-02-19 MED ORDER — ALBUTEROL SULFATE HFA 108 (90 BASE) MCG/ACT IN AERS: 2.0000 | INHALATION_SPRAY | Freq: Once | RESPIRATORY_TRACT | Status: DC | PRN

## 2020-02-19 MED ORDER — METHYLPREDNISOLONE SODIUM SUCC 125 MG IJ SOLR: 125.0000 mg | Freq: Once | INTRAMUSCULAR | Status: DC | PRN

## 2020-02-19 MED ORDER — DIPHENHYDRAMINE HCL 50 MG/ML IJ SOLN: 50.0000 mg | Freq: Once | INTRAMUSCULAR | Status: DC | PRN

## 2020-02-19 MED ORDER — EPINEPHRINE 0.3 MG/0.3ML IJ SOAJ: 0.3000 mg | Freq: Once | INTRAMUSCULAR | Status: DC | PRN

## 2020-02-19 MED ORDER — SODIUM CHLORIDE 0.9 % IV SOLN: INTRAVENOUS | Status: DC | PRN

## 2020-02-19 NOTE — Progress Notes (Signed)
°  Diagnosis: COVID-19  Physician: Dr. Patrick Wright  Procedure: [facility] Med: casirivimab\imdevimab infusion - Provided patient with casirivimab\imdevimab fact sheet for patients, parents and caregivers prior to infusion.  Complications: No immediate complications noted.  Discharge: Discharged home   Tara Mahoney 02/19/2020   

## 2020-02-19 NOTE — Discharge Instructions (Signed)

## 2020-02-20 ENCOUNTER — Ambulatory Visit (HOSPITAL_COMMUNITY): Payer: HRSA Program

## 2020-08-09 ENCOUNTER — Other Ambulatory Visit: Payer: Self-pay

## 2020-08-09 ENCOUNTER — Encounter (HOSPITAL_COMMUNITY): Payer: Self-pay | Admitting: *Deleted

## 2020-08-09 ENCOUNTER — Emergency Department (HOSPITAL_COMMUNITY)
Admission: EM | Admit: 2020-08-09 | Discharge: 2020-08-09 | Disposition: A | Discharge status: Home / Self Care | Payer: Medicaid Other | Attending: Emergency Medicine | Admitting: Emergency Medicine

## 2020-08-09 DIAGNOSIS — Y92512 Supermarket, store or market as the place of occurrence of the external cause: Secondary | ICD-10-CM | POA: Insufficient documentation

## 2020-08-09 DIAGNOSIS — Z87891 Personal history of nicotine dependence: Secondary | ICD-10-CM | POA: Insufficient documentation

## 2020-08-09 DIAGNOSIS — S0990XA Unspecified injury of head, initial encounter: Secondary | ICD-10-CM

## 2020-08-09 DIAGNOSIS — W01198A Fall on same level from slipping, tripping and stumbling with subsequent striking against other object, initial encounter: Secondary | ICD-10-CM | POA: Insufficient documentation

## 2020-08-09 DIAGNOSIS — S0181XA Laceration without foreign body of other part of head, initial encounter: Secondary | ICD-10-CM | POA: Insufficient documentation

## 2020-08-09 MED ORDER — IBUPROFEN 800 MG PO TABS
800.0000 mg | ORAL_TABLET | Freq: Once | ORAL | Status: AC
  Administered 2020-08-09: 800 mg via ORAL
  Filled 2020-08-09: qty 1

## 2020-08-09 NOTE — Discharge Instructions (Addendum)
Get help right away if: Your wound reopens. You have any of these signs of infection: More redness, swelling, or pain around the wound. A red streak at the area around the wound. Fluid or blood coming from the wound. Warmth coming from the wound. Pus or a bad smell coming from the wound. You develop a rash after the adhesive is applied.  Get help right away if: You have: A severe headache that is not helped by medicine. Trouble walking or weakness in your arms and legs. Clear or bloody fluid coming from your nose or ears. Changes in your vision. A seizure. Increased confusion or irritability. Your symptoms get worse. You are sleepier than normal and have trouble staying awake. You lose your balance. Your pupils change size. Your speech is slurred. Your dizziness gets worse. You vomit.

## 2020-08-09 NOTE — ED Provider Notes (Signed)
[facility] EMERGENCY DEPARTMENT Provider Note   CSN: [id number] Arrival date & time: 08/09/20  1332     History Chief Complaint  Patient presents with  . Head Injury    Tara Mahoney is a 35 y.o. female who presents emergency department chief complaint of head injury.  Patient was at the grocery store today when she slid on ice.  She hit her head against fencing poles that were hanging out of the back end of a truck in the parking lot.  She had immediate severe pain, headache and lightheadedness.  She had bleeding from the front of her scalp.  She was able to walk back to her car where she attended the wound using her first aid kit.  She said that she sat still for about 20 minutes before she felt stable, get seen.  Injury occurred about an hour and a half ago.  She did not lose consciousness.  She is not on blood thinners.  She denies any symptoms of vertigo, nausea, vomiting, changes in vision, difficulty with speech or ambulation.  Patient's last tetanus vaccination was in 2013  HPI     Past Medical History:  Diagnosis Date  . Blood transfusion without reported diagnosis   . Complication of anesthesia   . Dysautonomia (HCC)    sinus arrythmia  . Family history of anesthesia complication    PONV  . Headache(784.0)   . Heart murmur   . MVP (mitral valve prolapse)   . PONV (postoperative nausea and vomiting)   . Ventricular tachycardia, nonsustained [facility])     Patient Active Problem List   Diagnosis Date Noted  . Autonomic dysfunction 03/20/2015  . Chest pain 02/27/2014  . Sinus tachycardia 02/06/2014  . PVC (premature ventricular contraction) 05/09/2013  . PAC (premature atrial contraction) 05/09/2013  . Nonsustained ventricular tachycardia ([place]) 05/09/2013  . Abdominal pain, left lower quadrant 02/20/2013  . 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 PALPITATIONS 09/23/2010    Past Surgical History:  Procedure Laterality Date  . ABDOMINAL HYSTERECTOMY    . CERVICAL  CERCLAGE  2008  . CERVICAL CERCLAGE  09/08/2011   Procedure: CERCLAGE CERVICAL;  Surgeon: Florian Buff, MD;  Location: [facility];  Service: Gynecology;  Laterality: N/A;  [name],CST-scrubbed; doctor scrubbed in @ [phone]  . LAPAROSCOPIC LYSIS OF ADHESIONS  08/09/2012   Procedure: LAPAROSCOPIC LYSIS OF ADHESIONS;  Surgeon: Florian Buff, MD;  Location: [facility];  Service: Gynecology;  Laterality: N/A;  . LAPAROSCOPY  08/09/2012   Procedure: LAPAROSCOPY DIAGNOSTIC;  Surgeon: Florian Buff, MD;  Location: [facility];  Service: Gynecology;  Laterality: N/A;  started at  1208-Vaginal trachelectomy (vaginal removal of cervical stump)   . MYRINGOTOMY    . SUPRACERVICAL ABDOMINAL HYSTERECTOMY  01/26/2012   Procedure: HYSTERECTOMY SUPRACERVICAL ABDOMINAL;  Surgeon: Florian Buff, MD;  Location: [place] [facility];  Service: Gynecology;;  converted [phone]     OB History    Gravida  2   Para  2   Term  1   Preterm  1   AB      Living  2     SAB      IAB      Ectopic      Multiple      Live Births  1           Family History  Problem Relation Age of Onset  . Hypertension Brother   . Heart disease Brother  hole in heart  . Ovarian cancer Mother   . Anesthesia problems Neg Hx   . Hypotension Neg Hx   . Pseudochol deficiency Neg Hx   . Malignant hyperthermia Neg Hx   . Other Neg Hx     Social History   Tobacco Use  . Smoking status: Former Smoker    Years: 1.00    Types: Cigarettes    Start date: 10/01/2001    Quit date: 09/01/2009    Years since quitting: 10.9  . Smokeless tobacco: Never Used  . Tobacco comment: smokes 1 pack a week  Substance Use Topics  . Alcohol use: No    Alcohol/week: 0.0 standard drinks  . Drug use: No    Home Medications Prior to Admission medications   Medication Sig Start Date End Date Taking? Authorizing Provider  Carboxymethylcellulose Sodium (EYE DROPS OP) Apply 1-2 drops to eye once. For redness/eye irritation    [provider]   flecainide (TAMBOCOR) 150 MG tablet TAKE ONE-HALF TABLET (75 MG TOTAL) BY MOUTH TWICE A DAY 11/03/18   Deboraha Sprang, MD  Multiple Vitamin (MULTIVITAMIN WITH MINERALS) TABS tablet Take 1 tablet by mouth daily.    [provider]  neomycin-polymyxin-hydrocortisone (CORTISPORIN) 3.5-10000-1 ophthalmic suspension Place 2 drops into the left eye 4 (four) times daily. 10/26/18   Barrie Folk, PA-C    Allergies    Carvedilol, Definity [perflutren lipid microsphere], Hydrocodone-acetaminophen, Morphine and related, Verapamil, Atenolol, Cardizem cd [diltiazem hcl er beads], Metoprolol, and Nebivolol  Review of Systems   Review of Systems Ten systems reviewed and are negative for acute change, except as noted in the HPI.   Physical Exam Updated Vital Signs BP (!) 181/99 (BP Location: Right Arm)   Pulse (!) 108   Temp 97.6 F (36.4 C) (Oral)   Resp 16   Ht 5' 3"  (1.6 m)   Wt 72.2 kg   LMP 05/04/2011   SpO2 100%   BMI 28.20 kg/m   Physical Exam Vitals and nursing note reviewed.  Constitutional:      General: She is not in acute distress.    Appearance: She is well-developed and well-nourished. She is not diaphoretic.  HENT:     Head: Normocephalic and atraumatic.  Eyes:     General: No scleral icterus.    Conjunctiva/sclera: Conjunctivae normal.  Cardiovascular:     Rate and Rhythm: Normal rate and regular rhythm.     Heart sounds: Normal heart sounds. No murmur heard. No friction rub. No gallop.   Pulmonary:     Effort: Pulmonary effort is normal. No respiratory distress.     Breath sounds: Normal breath sounds.  Abdominal:     General: Bowel sounds are normal. There is no distension.     Palpations: Abdomen is soft. There is no mass.     Tenderness: There is no abdominal tenderness. There is no guarding.  Musculoskeletal:     Cervical back: Normal range of motion.  Skin:    General: Skin is warm and dry.  Neurological:     General: No focal deficit  present.     Mental Status: She is alert and oriented to person, place, and time.     GCS: GCS eye subscore is 4. GCS verbal subscore is 5. GCS motor subscore is 6.     Cranial Nerves: No cranial nerve deficit.     Sensory: No sensory deficit.     Motor: No weakness.     Coordination: Coordination normal.  Gait: Gait normal.     Deep Tendon Reflexes: Reflexes normal.     Comments: Speech is clear and goal oriented, follows commands Major Cranial nerves without deficit, no facial droop Normal strength in upper and lower extremities bilaterally including dorsiflexion and plantar flexion, strong and equal grip strength Sensation normal to light and sharp touch Moves extremities without ataxia, coordination intact Normal finger to nose and rapid alternating movements Neg romberg, no pronator drift Normal gait Normal heel-shin and balance  Psychiatric:        Behavior: Behavior normal.        Thought Content: Thought content normal.        Judgment: Judgment normal.     ED Results / Procedures / Treatments   Labs (all labs ordered are listed, but only abnormal results are displayed) Labs Reviewed - No data to display  EKG None  Radiology No results found.  Procedures .Marland KitchenLaceration Repair  Date/Time: 08/09/2020 3:21 PM Performed by: Margarita Mail, PA-C Authorized by: Margarita Mail, PA-C   Consent:    Consent obtained:  Verbal   Consent given by:  Patient   Risks discussed:  Infection, need for additional repair, pain, poor cosmetic result and poor wound healing   Alternatives discussed:  No treatment and delayed treatment Universal protocol:    Procedure explained and questions answered to patient or proxy's satisfaction: yes     Relevant documents present and verified: yes     Test results available: yes     Imaging studies available: yes     Required blood products, implants, devices, and special equipment available: yes     Site/side marked: yes      Immediately prior to procedure, a time out was called: yes     Patient identity confirmed:  Verbally with patient Anesthesia:    Anesthesia method:  None Laceration details:    Location:  Face   Face location:  Forehead   Length (cm):  3   Depth (mm):  1 Pre-procedure details:    Preparation:  Patient was prepped and draped in usual sterile fashion Exploration:    Wound exploration: wound explored through full range of motion and entire depth of wound visualized   Treatment:    Area cleansed with:  Chlorhexidine   Amount of cleaning:  Standard   Irrigation solution:  Sterile saline   Irrigation method:  Syringe   Debridement:  None   Undermining:  None Skin repair:    Repair method:  Tissue adhesive Approximation:    Approximation:  Close Repair type:    Repair type:  Simple Post-procedure details:    Dressing:  Open (no dressing)   Procedure completion:  Tolerated well, no immediate complications   (including critical care time)  Medications Ordered in ED Medications  ibuprofen (ADVIL) tablet 800 mg (has no administration in time range)    ED Course  I have reviewed the triage vital signs and the nursing notes.  Pertinent labs & imaging results that were available during my care of the patient were reviewed by me and considered in my medical decision making (see chart for details).    MDM Rules/Calculators/A&P                          35 year old female here complaining of head injury.  She has a tiny superficial laceration that we were able to repair with tissue adhesive.  Patient given home care instructions and return precautions.  She  has a normal neurologic examination.  Mild headache at this time.  Patient also has slightly elevated blood pressure which she may follow-up with her primary care about.  Do not think that she needs any imaging of her head at this time.  She has not had any loss of consciousness or vomiting.  She appears otherwise appropriate for  discharge.  Discussed return precautions Final Clinical Impression(s) / ED Diagnoses Final diagnoses:  None    Rx / DC Orders ED Discharge Orders    None       Margarita Mail, PA-C 08/09/20 [place], MD 08/12/20 [phone]

## 2020-08-09 NOTE — ED Triage Notes (Signed)
Pt slipped on ice and fell striking her head a metal black fencing.  Pt denies any LOC but did have a light headedness but denies at present.  C/o HA. Denies any N/V or blurry vision.

## 2020-08-12 ENCOUNTER — Encounter (HOSPITAL_COMMUNITY): Payer: Self-pay | Admitting: Emergency Medicine

## 2020-08-12 ENCOUNTER — Emergency Department (HOSPITAL_COMMUNITY)
Admission: EM | Admit: 2020-08-12 | Discharge: 2020-08-12 | Disposition: A | Discharge status: Home / Self Care | Payer: Medicaid Other | Attending: Emergency Medicine | Admitting: Emergency Medicine

## 2020-08-12 ENCOUNTER — Other Ambulatory Visit: Payer: Self-pay

## 2020-08-12 DIAGNOSIS — Z5321 Procedure and treatment not carried out due to patient leaving prior to being seen by health care provider: Secondary | ICD-10-CM | POA: Insufficient documentation

## 2020-08-12 DIAGNOSIS — R220 Localized swelling, mass and lump, head: Secondary | ICD-10-CM | POA: Insufficient documentation

## 2020-08-12 DIAGNOSIS — S0990XA Unspecified injury of head, initial encounter: Secondary | ICD-10-CM | POA: Insufficient documentation

## 2020-08-12 DIAGNOSIS — W228XXA Striking against or struck by other objects, initial encounter: Secondary | ICD-10-CM | POA: Insufficient documentation

## 2020-08-12 NOTE — ED Triage Notes (Signed)
Pt to the ED with complaints of facial swelling that was apparent this morning. Patient says she has no memory of anything since Saturday when her head struck metal fencing. Pt Has been sleeping a lot since the accident.  Pt denies tightness or swelling in her throat. Pt has moderate swelling and bruising under both eyes.

## 2020-08-13 ENCOUNTER — Emergency Department (HOSPITAL_COMMUNITY): Payer: Self-pay

## 2020-08-13 ENCOUNTER — Other Ambulatory Visit: Payer: Self-pay

## 2020-08-13 ENCOUNTER — Emergency Department (HOSPITAL_COMMUNITY)
Admission: EM | Admit: 2020-08-13 | Discharge: 2020-08-13 | Disposition: A | Discharge status: Home / Self Care | Payer: Self-pay | Attending: Emergency Medicine | Admitting: Emergency Medicine

## 2020-08-13 ENCOUNTER — Telehealth: Payer: Self-pay

## 2020-08-13 ENCOUNTER — Encounter (HOSPITAL_COMMUNITY): Payer: Self-pay

## 2020-08-13 DIAGNOSIS — Z87891 Personal history of nicotine dependence: Secondary | ICD-10-CM | POA: Insufficient documentation

## 2020-08-13 DIAGNOSIS — I472 Ventricular tachycardia: Secondary | ICD-10-CM | POA: Insufficient documentation

## 2020-08-13 DIAGNOSIS — W2209XA Striking against other stationary object, initial encounter: Secondary | ICD-10-CM | POA: Insufficient documentation

## 2020-08-13 DIAGNOSIS — S060X0A Concussion without loss of consciousness, initial encounter: Secondary | ICD-10-CM | POA: Insufficient documentation

## 2020-08-13 DIAGNOSIS — L089 Local infection of the skin and subcutaneous tissue, unspecified: Secondary | ICD-10-CM | POA: Insufficient documentation

## 2020-08-13 MED ORDER — DOXYCYCLINE HYCLATE 100 MG PO CAPS
100.0000 mg | ORAL_CAPSULE | Freq: Two times a day (BID) | ORAL | 0 refills | Status: AC
Start: 2020-08-13 — End: 2020-08-20

## 2020-08-13 MED ORDER — DOXYCYCLINE HYCLATE 100 MG PO TABS
100.0000 mg | ORAL_TABLET | Freq: Once | ORAL | Status: AC
  Administered 2020-08-13: 100 mg via ORAL
  Filled 2020-08-13: qty 1

## 2020-08-13 NOTE — ED Notes (Signed)
Pt states that her husband is coming to get her, pt states that she is ready to go home.

## 2020-08-13 NOTE — Discharge Instructions (Addendum)
Please take the doxycycline, as directed. You have what appears to be an impetigo infection involving your wound. I would recommend a topical antibiotic, however it would dissolve your tissue adhesive. Please wait for your tissue adhesive to dissolve spontaneously before applying any topical antibiotic ointments.  You also have symptoms suggestive of concussion. Your CT imaging obtained here in the ED was reassuring. Please follow-up with your primary care provider regarding today's encounter. I have also provided you with a referral to Dr. Antoine Primas, concussion specialist. Please call to schedule an appointment. They will also call you. Avoid contact sports for at least 2 to 3 weeks or until you are cleared.  Return to the ED or seek immediate medical attention should you experience any new or worsening symptoms.

## 2020-08-13 NOTE — Telephone Encounter (Signed)
Spoke with patient. She states that she does not have insurance and wants to know process. Explain to patient that she would need to pay $100 up front and then will be billed remainder at discounted rate. Patient would like to speak with her husband if this is feasible for them. Recommend to patient that if she is not seen in our clinic to follow-up with primary care provider. Patient voices understanding.

## 2020-08-13 NOTE — ED Provider Notes (Signed)
[facility] EMERGENCY DEPARTMENT Provider Note   CSN: [id number] Arrival date & time: 08/13/20  5366     History Chief Complaint  Patient presents with  . Facial Swelling    Tara Mahoney is a 35 y.o. female who presents the ED with complaints of facial swelling.  I reviewed patient's medical record and she was evaluated on 2020-08-09 after sustaining mechanical fall while ambulating on ice.  She hit her head on fencing poles in the bed of a truck and sustained minor laceration.  She was able to ambulate back to her car and provide wound care.  Patient's wound was cleaned with chlorhexidine and then repaired using tissue adhesive.  Patient had a normal neurologic exam and denied any LOC or nausea.  For that reason, no imaging was obtained.  On my examination, patient states that she does not recall what has transpired since she was discharged from the hospital.  She states that she goes to bed and wakes up the next morning and does not recall the previous day.  She states that her forehead feels very itchy and uncomfortable.  She also has noticed swelling, mostly periorbitally, which is her primary concern.  She denies any sore throat or throat closing sensation, visual changes, pain with EOMs, fevers or chills, nausea or vomiting, or other symptoms.  HPI     Past Medical History:  Diagnosis Date  . Blood transfusion without reported diagnosis   . Complication of anesthesia   . Dysautonomia (HCC)    sinus arrythmia  . Family history of anesthesia complication    PONV  . Headache(784.0)   . Heart murmur   . MVP (mitral valve prolapse)   . PONV (postoperative nausea and vomiting)   . Ventricular tachycardia, nonsustained [facility])     Patient Active Problem List   Diagnosis Date Noted  . Autonomic dysfunction 03/20/2015  . Chest pain 02/27/2014  . Sinus tachycardia 02/06/2014  . PVC (premature ventricular contraction) 05/09/2013  . PAC (premature atrial contraction) 05/09/2013   . Nonsustained ventricular tachycardia (HCC) 05/09/2013  . Abdominal pain, left lower quadrant 02/20/2013  . 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 PALPITATIONS 09/23/2010    Past Surgical History:  Procedure Laterality Date  . ABDOMINAL HYSTERECTOMY    . CERVICAL CERCLAGE  2008  . CERVICAL CERCLAGE  09/08/2011   Procedure: CERCLAGE CERVICAL;  Surgeon: Lazaro Arms, MD;  Location: [facility]S;  Service: Gynecology;  Laterality: N/A;  [name],CST-scrubbed; doctor scrubbed in @ [phone]  . LAPAROSCOPIC LYSIS OF ADHESIONS  08/09/2012   Procedure: LAPAROSCOPIC LYSIS OF ADHESIONS;  Surgeon: Lazaro Arms, MD;  Location: [facility];  Service: Gynecology;  Laterality: N/A;  . LAPAROSCOPY  08/09/2012   Procedure: LAPAROSCOPY DIAGNOSTIC;  Surgeon: Lazaro Arms, MD;  Location: [facility]S;  Service: Gynecology;  Laterality: N/A;  started at  1208-Vaginal trachelectomy (vaginal removal of cervical stump)   . MYRINGOTOMY    . SUPRACERVICAL ABDOMINAL HYSTERECTOMY  01/26/2012   Procedure: HYSTERECTOMY SUPRACERVICAL ABDOMINAL;  Surgeon: Lazaro Arms, MD;  Location: [facility];  Service: Gynecology;;  converted [phone]     OB History    Gravida  2   Para  2   Term  1   Preterm  1   AB      Living  2     SAB      IAB      Ectopic      Multiple      Live Births  1           Family History  Problem Relation Age of Onset  . Hypertension Brother   . Heart disease Brother        hole in heart  . Ovarian cancer Mother   . Anesthesia problems Neg Hx   . Hypotension Neg Hx   . Pseudochol deficiency Neg Hx   . Malignant hyperthermia Neg Hx   . Other Neg Hx     Social History   Tobacco Use  . Smoking status: Former Smoker    Years: 1.00    Types: Cigarettes    Start date: 10/01/2001    Quit date: 09/01/2009    Years since quitting: 10.9  . Smokeless tobacco: Never Used  . Tobacco comment: smokes 1 pack a week  Substance Use Topics  . Alcohol use: No    Alcohol/week: 0.0 standard drinks   . Drug use: No    Home Medications Prior to Admission medications   Medication Sig Start Date End Date Taking? Authorizing Provider  doxycycline (VIBRAMYCIN) 100 MG capsule Take 1 capsule (100 mg total) by mouth 2 (two) times daily for 7 days. 08/13/20 08/20/20 Yes Lorelee New, PA-C  Carboxymethylcellulose Sodium (EYE DROPS OP) Apply 1-2 drops to eye once. For redness/eye irritation    [provider]  flecainide (TAMBOCOR) 150 MG tablet TAKE ONE-HALF TABLET (75 MG TOTAL) BY MOUTH TWICE A DAY 11/03/18   Duke Salvia, MD  Multiple Vitamin (MULTIVITAMIN WITH MINERALS) TABS tablet Take 1 tablet by mouth daily.    [provider]  neomycin-polymyxin-hydrocortisone (CORTISPORIN) 3.5-10000-1 ophthalmic suspension Place 2 drops into the left eye 4 (four) times daily. 10/26/18   Shanon Ace, PA-C    Allergies    Carvedilol, Definity [perflutren lipid microsphere], Hydrocodone-acetaminophen, Morphine and related, Verapamil, Atenolol, Cardizem cd [diltiazem hcl er beads], Metoprolol, and Nebivolol  Review of Systems   Review of Systems  All other systems reviewed and are negative.   Physical Exam Updated Vital Signs BP (!) 155/99 (BP Location: Right Arm)   Pulse (!) 104   Temp 99.1 F (37.3 C) (Oral)   Resp 18   LMP 05/04/2011   SpO2 100%   Physical Exam Vitals and nursing note reviewed. Exam conducted with a chaperone present.  HENT:     Head:     Comments: Laceration repair over forehead.  Periorbital edema and swelling.  Impetiginous rash involving laceration. Eyes:     General: No scleral icterus.    Extraocular Movements: Extraocular movements intact.     Conjunctiva/sclera: Conjunctivae normal.     Pupils: Pupils are equal, round, and reactive to light.     Comments: No conjunctival injection.  PERRL and EOM intact.  No entrapment.  No pain with EOMs.  Visual acuity grossly intact.  Neck:     Comments: No midline cervical tenderness to  palpation. Cardiovascular:     Rate and Rhythm: Normal rate.     Pulses: Normal pulses.  Pulmonary:     Effort: Pulmonary effort is normal. No respiratory distress.  Musculoskeletal:        General: Normal range of motion.     Cervical back: Normal range of motion. No rigidity or tenderness.  Skin:    General: Skin is dry.     Capillary Refill: Capillary refill takes less than 2 seconds.  Neurological:     General: No focal deficit present.     Mental Status: She is alert and oriented  to person, place, and time.     GCS: GCS eye subscore is 4. GCS verbal subscore is 5. GCS motor subscore is 6.  Psychiatric:        Mood and Affect: Mood normal.        Behavior: Behavior normal.        Thought Content: Thought content normal.        ED Results / Procedures / Treatments   Labs (all labs ordered are listed, but only abnormal results are displayed) Labs Reviewed - No data to display  EKG None  Radiology CT Head Wo Contrast  Result Date: 08/13/2020 CLINICAL DATA:  35 year old female status post fall 4 days ago with laceration repair. Progressive facial swelling since yesterday. EXAM: CT HEAD WITHOUT CONTRAST TECHNIQUE: Contiguous axial images were obtained from the base of the skull through the vertex without intravenous contrast. COMPARISON:  Head CT 01/16/2015. FINDINGS: Brain: No midline shift, ventriculomegaly, mass effect, evidence of mass lesion, intracranial hemorrhage or evidence of cortically based acute infarction. Gray-white matter differentiation is within normal limits throughout the brain. Vascular: No suspicious intracranial vascular hyperdensity. Skull: Calvarium is stable and intact. Sinuses/Orbits: Visualized paranasal sinuses and mastoids are stable and well pneumatized. Other: Midline forehead scalp soft tissue thickening, subcutaneous stranding. Similar soft tissue thickening along the anterior preseptal orbital spaces, see also face CT reported separately. Other  scalp soft tissues appear negative. IMPRESSION: 1. Anterior scalp/face soft tissue injury without underlying skull fracture. See also Face CT reported separately. 2. Stable and normal noncontrast CT appearance of the brain. Electronically Signed   By: Odessa Fleming M.D.   On: 08/13/2020 11:58   CT Maxillofacial Wo Contrast  Result Date: 08/13/2020 CLINICAL DATA:  35 year old female status post fall 4 days ago with laceration repair. Progressive facial swelling since yesterday. EXAM: CT MAXILLOFACIAL WITHOUT CONTRAST TECHNIQUE: Multidetector CT imaging of the maxillofacial structures was performed. Multiplanar CT image reconstructions were also generated. COMPARISON:  Head CT today reported separately. FINDINGS: Osseous: Mandible is intact and appears normally located. Bilateral maxilla, zygoma and pterygoid bones appear intact. No convincing nasal bone fracture. Intact central skull base. Visible cervical vertebrae appear intact. Visible calvarium intact. Orbits: Intact bilateral orbital walls. Globes and intraorbital soft tissues appears symmetric and within normal limits. There is bilateral preseptal, premalar (series 7, image 26), and also forehead soft tissue thickening and stranding. No soft tissue gas. Sinuses: Paranasal sinuses are clear aside from minimal bubbly opacity in the medial left sphenoid sinus (series 8, image 22). Tympanic cavities and mastoids are clear. Soft tissues: Trace retained secretions in the pharynx. Otherwise negative visible noncontrast deep soft tissue spaces of the face. Limited intracranial: Negative. IMPRESSION: Anterior face and forehead soft tissue swelling/injury. No underlying facial fracture or other complicating features. Electronically Signed   By: Odessa Fleming M.D.   On: 08/13/2020 12:05    Procedures Procedures   Medications Ordered in ED Medications  doxycycline (VIBRA-TABS) tablet 100 mg (has no administration in time range)    ED Course  I have reviewed the triage  vital signs and the nursing notes.  Pertinent labs & imaging results that were available during my care of the patient were reviewed by me and considered in my medical decision making (see chart for details).    MDM Rules/Calculators/A&P                          Vastie Laduke was evaluated in Emergency Department on 08/13/2020  for the symptoms described in the history of present illness. She was evaluated in the context of the global COVID-19 pandemic, which necessitated consideration that the patient might be at risk for infection with the SARS-CoV-2 virus that causes COVID-19. Institutional protocols and algorithms that pertain to the evaluation of patients at risk for COVID-19 are in a state of rapid change based on information released by regulatory bodies including the CDC and federal and state organizations. These policies and algorithms were followed during the patient's care in the ED.  I personally reviewed patient's medical chart and all notes from triage and staff during today's encounter. I have also ordered and reviewed all labs and imaging that I felt to be medically necessary in the evaluation of this patient's complaints and with consideration of their with their physical exam. If needed, translation services were available and utilized.   Patient with head injury which did not cause of loss of consciousness but with persistent headache since the initial trauma.  No evidence of skull fracture on physical exam. Patient is not taking anticoagulants, is less than 65 and has no history of subarachnoid or subdural hemorrhage. Patient endorses memory disturbance and intermittent nausea, but denies vomiting, vision changes,cognitive or memory dysfunction and vertigo.  Patient with no focal neurological deficits on physical exam.  Discussed thoroughly symptoms to return to the emergency department including severe headaches, disequilibrium, vomiting, double vision, extremity weakness, difficulty  ambulating, or any other concerning symptoms.    I also spoke with the patient's husband, [name], who states that she has been relatively mean the past few days and having memory disturbance.  She forgets having conversations with her father.  She has been nauseated, but without any episodes of emesis.    CT obtained of head and maxillofacial given periorbital edema and memory disturbance.  Concern for small intracranial hemorrhage versus basilar skull fracture.  I personally reviewed the imaging which was largely unremarkable.  I considered LP, but have lower suspicion for bleed. Patient also states that she would prefer not to proceed with LP testing.  No fluid collection underneath the incision concerning for abscess or overlying skin changes are concerning for infection.  She describes them as itchy.  Topical mupirocin would dissolve the skin adhesive, instead will proceed with oral doxycycline.  Once her tissue adhesive spontaneously falls off, she can then continue with a topical antibiotic as needed.    Discussed the likely etiology of patient's symptoms being concussive in nature.  Will refer to concussion specialist, Dr. Ayesha Mohair.  Patient will be discharged with information pertaining to diagnosis and advised to use over-the-counter medications like NSAIDs and Tylenol for pain relief. Pt has also advised to not participate in contact sports until they are completely asymptomatic for at least 1 week or they are cleared by their doctor.    Final Clinical Impression(s) / ED Diagnoses Final diagnoses:  Concussion without loss of consciousness, initial encounter  Wound infection    Rx / DC Orders ED Discharge Orders         Ordered    AMB referral to sports medicine       Comments: Concussion   08/13/20 1410    doxycycline (VIBRAMYCIN) 100 MG capsule  2 times daily        08/13/20 1411           Lorelee New, PA-C 08/13/20 1413    Pricilla Loveless, MD 08/14/20 [phone]

## 2020-08-13 NOTE — ED Triage Notes (Signed)
Pt reports that she was seen Saturday when she fell and came in for laceration repair. Pt reports that she woke up yesterday morning with facial swelling esp noted yesterday . Mild bruising under eyes. Denies tightness or swelling of throat

## 2020-08-13 NOTE — Telephone Encounter (Signed)
Pt husband called, wants to schedule 1/27 and agreed to cover the $100.00 downpayment. Scheduled with Dr. Denyse Amass in 30 minute slot.

## 2020-08-14 ENCOUNTER — Ambulatory Visit (INDEPENDENT_AMBULATORY_CARE_PROVIDER_SITE_OTHER): Payer: Self-pay | Admitting: Family Medicine

## 2020-08-14 ENCOUNTER — Other Ambulatory Visit: Payer: Self-pay

## 2020-08-14 VITALS — BP 128/84 | HR 105 | Ht 63.0 in | Wt 155.0 lb

## 2020-08-14 DIAGNOSIS — S060X0A Concussion without loss of consciousness, initial encounter: Secondary | ICD-10-CM

## 2020-08-14 DIAGNOSIS — L233 Allergic contact dermatitis due to drugs in contact with skin: Secondary | ICD-10-CM

## 2020-08-14 MED ORDER — SUMATRIPTAN SUCCINATE 50 MG PO TABS
100.0000 mg | ORAL_TABLET | ORAL | 1 refills | Status: DC | PRN
Start: 2020-08-14 — End: 2020-09-09

## 2020-08-14 MED ORDER — PREDNISONE 50 MG PO TABS
50.0000 mg | ORAL_TABLET | Freq: Every day | ORAL | 0 refills | Status: DC
Start: 2020-08-14 — End: 2020-09-09

## 2020-08-14 MED ORDER — TOPIRAMATE 50 MG PO TABS
50.0000 mg | ORAL_TABLET | Freq: Two times a day (BID) | ORAL | 2 refills | Status: DC
Start: 2020-08-14 — End: 2020-09-09

## 2020-08-14 MED ORDER — PROMETHAZINE HCL 25 MG PO TABS
25.0000 mg | ORAL_TABLET | Freq: Four times a day (QID) | ORAL | 2 refills | Status: DC | PRN
Start: 2020-08-14 — End: 2021-12-30

## 2020-08-14 NOTE — Patient Instructions (Addendum)
Thank you for coming in today.  STOP antibiotic ointment.   Ok to use a little cortisone on the rash.   If worsening fill and take the prednisone.   For headache start Topamax now.   Start 1/2 pill at night and increase to 1/2 twice daily over 3 days then 1 pill twice daily over a few more days.   Take phenergan as needed for nausea or vomiting especially with migraine.   Take imitrex as needed at the onset of a migraine. You may repeat the dose once if needed in 2 hours.   Get the goodRx app on your phone.   Recheck with me in about 2 weeks.    Concussion, Adult  A concussion is a brain injury from a hard, direct hit (trauma) to your head or body. This direct hit causes your brain to quickly shake back and forth inside your skull. A concussion may also be called a mild traumatic brain injury (TBI). Healing from this injury can take time. What are the causes? This condition is caused by:  A direct hit to your head, such as: ? Running into a player during a game. ? Being hit in a fight. ? Hitting your head on a hard surface.  A quick and sudden movement of the head or neck, such as in a car crash. What are the signs or symptoms? The signs of a concussion can be hard to notice. They may be missed by you, family members, and doctors. You may look fine on the outside but may not act or feel normal. Physical symptoms  Headaches.  Being dizzy.  Problems with body balance.  Being sensitive to light or noise.  Vomiting or feeling like you may vomit.  Being tired.  Problems seeing or hearing.  Not sleeping or eating as you used to.  Seizure. Mental and emotional symptoms  Feeling grouchy (irritable).  Having mood changes.  Problems remembering things.  Trouble focusing your mind (concentrating), organizing, or making decisions.  Being slow to think, act, react, speak, or read.  Feeling worried or nervous (anxious).  Feeling sad (depressed). How is this  treated? This condition may be treated by:  Stopping sports or activity if you are injured. If you hit your head or have signs of concussion: ? Do not return to sports or activities the same day. ? Get checked by a doctor before you return to your activities.  Resting your body and your mind.  Being watched carefully, often at home.  Medicines to help with symptoms such as: ? Headaches. ? Feeling like you may vomit. ? Problems with sleep.  Avoiding alcohol and drugs.  Being asked to go to a concussion clinic or a place to help you recover (rehabilitation center). Recovery from a concussion can take time. Return to activities only:  When you are fully healed.  When your doctor says it is safe. Avoid taking strong pain medicines (opioids) for a concussion. Follow these instructions at home: Activity  Limit activities that need a lot of thought or focus, such as: ? Homework or work for your job. ? Watching TV. ? Using the computer or phone. ? Playing memory games and puzzles.  Rest. Rest helps your brain heal. Make sure you: ? Get plenty of sleep. Most adults should get 7-9 hours of sleep each night. ? Rest during the day. Take naps or breaks when you feel tired.  Avoid activity like exercise until your doctor says its safe. Stop any activity  that makes symptoms worse.  Do not do activities that could cause a second concussion, such as riding a bike or playing sports.  Ask your doctor when you can return to your normal activities, such as school, work, sports, and driving. Your ability to react may be slower. Do not do these activities if you are dizzy. General instructions  Take over-the-counter and prescription medicines only as told by your doctor.  Do not drink alcohol until your doctor says you can.  Watch your symptoms and tell other people to do the same. Other problems can occur after a concussion. Older adults have a higher risk of serious problems.  Tell your  work Production designer, theatre/television/film, teachers, Tax adviser, school counselor, coach, or Event organiser about your injury and symptoms. Tell them about what you can or cannot do.  Keep all follow-up visits as told by your doctor. This is important.   How is this prevented? It is very important that you do not get another brain injury. In rare cases, another injury can cause brain damage that will not go away, brain swelling, or death. The risk of this is greatest in the first 7-10 days after a head injury. To avoid injuries:  Stop activities that could lead to a second concussion, such as contact sports, until your doctor says it is okay.  When you return to sports or activities: ? Do not crash into other players. This is how most concussions happen. ? Follow the rules. ? Respect other players. Do not engage in violent behavior while playing.  Get regular exercise. Do strength and balance training.  Wear a helmet that fits you well during sports, biking, or other activities.  Helmets can help protect you from serious skull and brain injuries, but they do not protect you from a concussion. Even when wearing a helmet, you should avoid being hit in the head. Contact a doctor if:  Your symptoms do not get better.  You have new symptoms.  You have another injury. Get help right away if:  You have bad headaches or your headaches get worse.  You feel weak or numb in any part of your body.  You feel mixed up (confused).  Your balance gets worse.  You vomit often.  You feel more sleepy than normal.  You cannot speak well, or have slurred speech.  You have a seizure.  Others have trouble waking you up.  You have changes in how you act.  You have changes in how you see (vision).  You pass out (lose consciousness). These symptoms may be an emergency. Do not wait to see if the symptoms will go away. Get medical help right away. Call your local emergency services (911 in the [place] Do not drive  yourself to the hospital. Summary  A concussion is a brain injury from a hard, direct hit (trauma) to your head or body.  This condition is treated with rest and careful watching of symptoms.  Ask your doctor when you can return to your normal activities, such as school, work, or driving.  Get help right away if you have a very bad headache, feel weak in any part of your body, have a seizure, have changes in how you act or see, or if you are mixed up or more sleepy than normal. This information is not intended to replace advice given to you by your health care provider. Make sure you discuss any questions you have with your health care provider. Document Revised: 05/17/2019 Document Reviewed:  05/17/2019 Elsevier Patient Education  2021 [employer].

## 2020-08-14 NOTE — Progress Notes (Signed)
Subjective:    Chief Complaint: Felipa Emory, LAT, ATC, am serving as scribe for Dr. Clementeen Graham.  Juanice Warburton, is a 35 y.o. female who presents for evaluation of a head injury that occurred on 08/09/20 when she slipped on the ice at the grocery store and hit her head on some fencing poles that were hanging out of the back of a truck in the parking lot.  She had immediate severe pain, HA and lightheadedness and went to the [facility] ED for evaluation.  Her wound was repaired w/ tissue adhesive glue.  She returned to the [facility] ED yesterday due to significant memory issues and due to periorbital swelling.  Today, pt reports she is feeling sleepy, not remembering an entire day, only faces, and c/o headache. Pt had difficulty completing symptom checklist due to not being able to recall how she was feeling in the last 24 hours.   She has been applying antibiotic ointment to the wound on her forehead and has developed a rash.  Injury date : 08/09/20 Visit #: 1   History of Present Illness:    Concussion Self-Reported Symptom Score Symptoms rated on a scale 1-6, in last 24 hours   Headache: 5    Nausea: 2  Dizziness: 0  Vomiting: 0  Balance Difficulty: 2   Trouble Falling Asleep: 0   Fatigue: 4  Sleep Less Than Usual: 0  Daytime Drowsiness: 6  Sleep More Than Usual: 6  Photophobia: 6  Phonophobia: 6  Irritability: 6  Sadness: 2  Numbness or Tingling: 0  Nervousness: 0  Feeling More Emotional: 5  Feeling Mentally Foggy: 6  Feeling Slowed Down: 5  Memory Problems: 6  Difficulty Concentrating: 6  Visual Problems: 0   Total # of Symptoms: 15/22 Total Symptom Score: 73/132    Neck Pain: No  Tinnitus: No  Review of Systems: No fevers or chills    Review of History: No prior concussion.  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  Objective:    Physical Examination Vitals:   08/14/20 1120  BP: 128/84  Pulse: (!) 105  SpO2:  99%   Skin laceration present mid forehead with skin surrounding erythema.  Nontender.  Face itself is puffy and swollen without erythema or tenderness. Neuro: Alert and oriented normal coordination and balance.  Normal gait. Psych: Normal speech thought process and affect.   Radiology: CT Head Wo Contrast  Result Date: 08/13/2020 CLINICAL DATA:  34 year old female status post fall 4 days ago with laceration repair. Progressive facial swelling since yesterday. EXAM: CT HEAD WITHOUT CONTRAST TECHNIQUE: Contiguous axial images were obtained from the base of the skull through the vertex without intravenous contrast. COMPARISON:  Head CT 01/16/2015. FINDINGS: Brain: No midline shift, ventriculomegaly, mass effect, evidence of mass lesion, intracranial hemorrhage or evidence of cortically based acute infarction. Gray-white matter differentiation is within normal limits throughout the brain. Vascular: No suspicious intracranial vascular hyperdensity. Skull: Calvarium is stable and intact. Sinuses/Orbits: Visualized paranasal sinuses and mastoids are stable and well pneumatized. Other: Midline forehead scalp soft tissue thickening, subcutaneous stranding. Similar soft tissue thickening along the anterior preseptal orbital spaces, see also face CT reported separately. Other scalp soft tissues appear negative. IMPRESSION: 1. Anterior scalp/face soft tissue injury without underlying skull fracture. See also Face CT reported separately. 2. Stable and normal noncontrast CT appearance of the brain. Electronically Signed   By: Odessa Fleming M.D.   On: 08/13/2020 11:58   CT  Maxillofacial Wo Contrast  Result Date: 08/13/2020 CLINICAL DATA:  35 year old female status post fall 4 days ago with laceration repair. Progressive facial swelling since yesterday. EXAM: CT MAXILLOFACIAL WITHOUT CONTRAST TECHNIQUE: Multidetector CT imaging of the maxillofacial structures was performed. Multiplanar CT image reconstructions were also  generated. COMPARISON:  Head CT today reported separately. FINDINGS: Osseous: Mandible is intact and appears normally located. Bilateral maxilla, zygoma and pterygoid bones appear intact. No convincing nasal bone fracture. Intact central skull base. Visible cervical vertebrae appear intact. Visible calvarium intact. Orbits: Intact bilateral orbital walls. Globes and intraorbital soft tissues appears symmetric and within normal limits. There is bilateral preseptal, premalar (series 7, image 26), and also forehead soft tissue thickening and stranding. No soft tissue gas. Sinuses: Paranasal sinuses are clear aside from minimal bubbly opacity in the medial left sphenoid sinus (series 8, image 22). Tympanic cavities and mastoids are clear. Soft tissues: Trace retained secretions in the pharynx. Otherwise negative visible noncontrast deep soft tissue spaces of the face. Limited intracranial: Negative. IMPRESSION: Anterior face and forehead soft tissue swelling/injury. No underlying facial fracture or other complicating features. Electronically Signed   By: Odessa Fleming M.D.   On: 08/13/2020 12:05   I, Clementeen Graham, personally (independently) visualized and performed the interpretation of the images attached in this note.   Assessment and Plan   35 y.o. female with concussion complicated by forehead laceration. Concussion was quite severe.  Patient does not recall loss of consciousness but there are reports that it took her 20 minutes to gather herself well enough to sit up and go to the emergency room.  This sounds like a pretty severe concussion to me.  Fortunately she did have great medical care in the emergency room with a CT scan of the head and maxillofacial region that showed no fracture or intracranial bleeding.  For concussion will manage headache with Topamax.  She has a migraine history and I believe is having migraines as well so use Imitrex and Phenergan.  We will recheck back in about 2  weeks.  Additionally patient I believe is having allergic reaction to the topical antibiotic ointments that she is applying for her wound on her forehead.  This is contact dermatitis or irritant dermatitis.  Recommend she can discontinue the topical antibiotic ointment and use hydrocortisone cream.  I prescribed prednisone as a backup that she can use if worsening.  Again recheck in 2 weeks.  Precautions reviewed.     Action/Discussion: Reviewed diagnosis, management options, expected outcomes, and the reasons for scheduled and emergent follow-up. Questions were adequately answered. Patient expressed verbal understanding and agreement with the following plan.     Patient Education:  Reviewed with patient the risks (i.e, a repeat concussion, post-concussion syndrome, second-impact syndrome) of returning to play prior to complete resolution, and thoroughly reviewed the signs and symptoms of concussion.Reviewed need for complete resolution of all symptoms, with rest AND exertion, prior to return to play.  Reviewed red flags for urgent medical evaluation: worsening symptoms, nausea/vomiting, intractable headache, musculoskeletal changes, focal neurological deficits.  [facility] Con[facility]ussion Care Plan, which clearly outlines the plans stated above, was given to patient.   In addition to the time spent performing tests, I spent 30 min   Reviewed with patient the risks (i.e, a repeat concussion, post-concussion syndrome, second-impact syndrome) of returning to play prior to complete resolution, and thoroughly reviewed the signs and symptoms of      concussion. Reviewedf need for complete resolution of all symptoms,  with rest AND exertion, prior to return to play.  Reviewed red flags for urgent medical evaluation: worsening symptoms, nausea/vomiting, intractable headache, musculoskeletal changes, focal neurological deficits.  [facility] Conc[facility] Care Plan, which  clearly outlines the plans stated above, was given to patient   After Visit Summary printed out and provided to patient as appropriate.  The above documentation has been reviewed and is accurate and complete Clementeen Graham

## 2020-08-28 ENCOUNTER — Ambulatory Visit: Payer: Medicaid Other | Admitting: Family Medicine

## 2020-09-08 NOTE — Progress Notes (Signed)
Subjective:    Chief Complaint: Felipa Emory, LAT, ATC, am serving as scribe for Dr. Clementeen Graham.  Akyla Vavrek,  is a 35 y.o. female who presents for f/u of concussion that occurred on 08/09/20 when she slipped on the ice at the grocery store and hit her head on some fencing poles that were hanging out of the back of a truck in the parking lot.  She was last seen by Dr. Denyse Amass on 08/14/20 and noted significant memory difficulties, HA and sleepiness/fatigue.  She was prescribed Topamax for HA and phenergan for nausea.  She was also prescribed Imitrex for migraine.  Since her last visit, pt reports she is feeling better, but still having memory, photophobia, and anger issues. Pt's husband reports pt had an anger outburst yesterday while at the store. Pt seems to only remember negative things and is having trouble remembering whole days, only bits and pieces. Pt's husband notes an increased in the number of migraines, specifically to R temporal region, which is taking a toll on the household. Pt notes, 15-20 mins after taking migraine meds she gets instant relief. Pt also c/o tinnitus in R ear.  Injury date : 08/09/20 Visit #: 2   History of Present Illness:    Concussion Self-Reported Symptom Score Symptoms rated on a scale 1-6, in last 24 hours   Headache: 2    Nausea: 0  Dizziness: 0  Vomiting: 0  Balance Difficulty: 0   Trouble Falling Asleep: 0   Fatigue: 4  Sleep Less Than Usual: 0  Daytime Drowsiness: 5  Sleep More Than Usual: 6  Photophobia: 4  Phonophobia: 5  Irritability: 6  Sadness: 2  Numbness or Tingling: 0  Nervousness: 0  Feeling More Emotional: 4  Feeling Mentally Foggy: 6  Feeling Slowed Down: 1  Memory Problems: 6  Difficulty Concentrating: 6  Visual Problems: 0   Total # of Symptoms: 13/22 Total Symptom Score: 57/132  Previous Total # of Symptoms: 15/22 Previous Symptom Score: 73/132   Neck Pain: No  Tinnitus: Yes- R ear, very high pitched and very  loud  Review of Systems: No fevers or chills  Review of History: History of migraines.  History of palpitations.  Objective:    Physical Examination Vitals:   09/09/20 1132  BP: (!) 132/98  Pulse: (!) 111  SpO2: 100%   HEENT: Facial swelling significantly improved.  Scar left forehead small nontender nonerythematous Neuro: Alert and oriented normal coordination and gait. Psych: Speech thought process and affect.  Radiology:  CT Head Wo Contrast  Result Date: 08/13/2020 CLINICAL DATA:  35 year old female status post fall 4 days ago with laceration repair. Progressive facial swelling since yesterday. EXAM: CT HEAD WITHOUT CONTRAST TECHNIQUE: Contiguous axial images were obtained from the base of the skull through the vertex without intravenous contrast. COMPARISON:  Head CT 01/16/2015. FINDINGS: Brain: No midline shift, ventriculomegaly, mass effect, evidence of mass lesion, intracranial hemorrhage or evidence of cortically based acute infarction. Gray-white matter differentiation is within normal limits throughout the brain. Vascular: No suspicious intracranial vascular hyperdensity. Skull: Calvarium is stable and intact. Sinuses/Orbits: Visualized paranasal sinuses and mastoids are stable and well pneumatized. Other: Midline forehead scalp soft tissue thickening, subcutaneous stranding. Similar soft tissue thickening along the anterior preseptal orbital spaces, see also face CT reported separately. Other scalp soft tissues appear negative. IMPRESSION: 1. Anterior scalp/face soft tissue injury without underlying skull fracture. See also Face CT reported separately. 2. Stable and normal noncontrast CT appearance of the  brain. Electronically Signed   By: Odessa Fleming M.D.   On: 08/13/2020 11:58   CT Maxillofacial Wo Contrast  Result Date: 08/13/2020 CLINICAL DATA:  35 year old female status post fall 4 days ago with laceration repair. Progressive facial swelling since yesterday. EXAM: CT  MAXILLOFACIAL WITHOUT CONTRAST TECHNIQUE: Multidetector CT imaging of the maxillofacial structures was performed. Multiplanar CT image reconstructions were also generated. COMPARISON:  Head CT today reported separately. FINDINGS: Osseous: Mandible is intact and appears normally located. Bilateral maxilla, zygoma and pterygoid bones appear intact. No convincing nasal bone fracture. Intact central skull base. Visible cervical vertebrae appear intact. Visible calvarium intact. Orbits: Intact bilateral orbital walls. Globes and intraorbital soft tissues appears symmetric and within normal limits. There is bilateral preseptal, premalar (series 7, image 26), and also forehead soft tissue thickening and stranding. No soft tissue gas. Sinuses: Paranasal sinuses are clear aside from minimal bubbly opacity in the medial left sphenoid sinus (series 8, image 22). Tympanic cavities and mastoids are clear. Soft tissues: Trace retained secretions in the pharynx. Otherwise negative visible noncontrast deep soft tissue spaces of the face. Limited intracranial: Negative. IMPRESSION: Anterior face and forehead soft tissue swelling/injury. No underlying facial fracture or other complicating features. Electronically Signed   By: Odessa Fleming M.D.   On: 08/13/2020 12:05     Assessment and Plan   35 y.o. female with concussion in the setting of migraines.  Patient has had some improvement but clearly is having quite a bit of symptoms still.  Concussion is now 8 month old.  Migraine and memory and mood seem to be the main issue.  Mayumi Summerson presents with the following concussion subtypes. [x] Cognitive [] Cervical [] Vestibular [] Ocular [x] Migraine [x] Anxiety/Mood   Plan to treat by increasing Topamax from 50 mg twice daily to 100 mg twice daily.  We will recheck back in 2 to 4 weeks.  If mood is not improving will start Prozac.  Facial inflammation has significantly improved.    Encourage patient and her family to  follow-up through with the application for [facility] program.    Action/Discussion: Reviewed diagnosis, management options, expected outcomes, and the reasons for scheduled and emergent follow-up. Questions were adequately answered. Patient expressed verbal understanding and agreement with the following plan.     Patient Education:  Reviewed with patient the risks (i.e, a repeat concussion, post-concussion syndrome, second-impact syndrome) of returning to play prior to complete resolution, and thoroughly reviewed the signs and symptoms of concussion.Reviewed need for complete resolution of all symptoms, with rest AND exertion, prior to return to play.  Reviewed red flags for urgent medical evaluation: worsening symptoms, nausea/vomiting, intractable headache, musculoskeletal changes, focal neurological deficits.  [facility] Con[facility] Care Plan, which clearly outlines the plans stated above, was given to patient.   In addition to the time spent performing tests, I spent 30 min   Reviewed with patient the risks (i.e, a repeat concussion, post-concussion syndrome, second-impact syndrome) of returning to play prior to complete resolution, and thoroughly reviewed the signs and symptoms of      concussion. Reviewedf need for complete resolution of all symptoms, with rest AND exertion, prior to return to play.  Reviewed red flags for urgent medical evaluation: worsening symptoms, nausea/vomiting, intractable headache, musculoskeletal changes, focal neurological deficits.  [facility] Concussion Care Plan, which clearly outlines the plans stated above, was given to patient   After Visit Summary printed out and provided to patient as appropriate.  The above documentation has been reviewed and is  accurate and complete Clementeen Graham

## 2020-09-09 ENCOUNTER — Ambulatory Visit (INDEPENDENT_AMBULATORY_CARE_PROVIDER_SITE_OTHER): Payer: Self-pay | Admitting: Family Medicine

## 2020-09-09 ENCOUNTER — Other Ambulatory Visit: Payer: Self-pay

## 2020-09-09 VITALS — BP 132/98 | HR 111 | Ht 63.0 in | Wt 154.0 lb

## 2020-09-09 DIAGNOSIS — S060X0A Concussion without loss of consciousness, initial encounter: Secondary | ICD-10-CM | POA: Insufficient documentation

## 2020-09-09 DIAGNOSIS — S060X0D Concussion without loss of consciousness, subsequent encounter: Secondary | ICD-10-CM

## 2020-09-09 DIAGNOSIS — G43719 Chronic migraine without aura, intractable, without status migrainosus: Secondary | ICD-10-CM

## 2020-09-09 MED ORDER — TOPIRAMATE 100 MG PO TABS
100.0000 mg | ORAL_TABLET | Freq: Two times a day (BID) | ORAL | 2 refills | Status: DC
Start: 2020-09-09 — End: 2021-01-30

## 2020-09-09 MED ORDER — SUMATRIPTAN SUCCINATE 50 MG PO TABS
100.0000 mg | ORAL_TABLET | ORAL | 3 refills | Status: DC | PRN
Start: 2020-09-09 — End: 2020-12-04

## 2020-09-09 NOTE — Patient Instructions (Signed)
Thank you for coming in today.  Increase Topamax to 100 mg twice daily to prevent headaches.   Continue Imitrex as needed.   If mood continue to worsen let me know and I will prescribe Prozac.   Recheck in 2 -4 weeks.   Follow up the application for the [employer] program.

## 2020-09-29 NOTE — Progress Notes (Signed)
Subjective:    Chief Complaint: Tara Mahoney,  is a 35 y.o. female who presents for f/u of concussion that occurred on 08/09/20 when she slipped on the ice at the grocery store and hit her head on some fencing poles that were hanging out of the back of a truck in the parking lot.  She was last seen by Dr. Denyse Amass on 09/09/20 and noted con't issues w/ memory, photophobia, anger issues and migraines.  She was advised to increase her Topamax to 100mg  bid and to con't Imitrex.  Since her last visit, pt reports feeling OK, but still having memory issues. Pt is still experiencing anger issues. Pt is no longer having HA and has only experienced 2 migraines since her last visit. Pt's husband reports pt is repeating herself several times in conversation. Pt also c/o tingling in bilat hands that comes and goes.  Paresthesias not particularly bothersome.  Injury date : 08/09/20 Visit #: 3   History of Present Illness:    Concussion Self-Reported Symptom Score Symptoms rated on a scale 1-6, in last 24 hours   Headache: 0    Nausea: 0  Dizziness: 0  Vomiting: 0  Balance Difficulty: 0   Trouble Falling Asleep: 6   Fatigue: 5  Sleep Less Than Usual: 5  Daytime Drowsiness: 4  Sleep More Than Usual: 3  Photophobia: 3  Phonophobia: 5  Irritability: 6  Sadness: 6  Numbness or Tingling: 5  Nervousness: 1  Feeling More Emotional: 3  Feeling Mentally Foggy: 3  Feeling Slowed Down: 2  Memory Problems: 6  Difficulty Concentrating: 5  Visual Problems: 0   Total # of Symptoms: 16/22 Total Symptom Score: 68/132  Previous Total # of Symptoms: 13/22 Previous Symptom Score: 57/132   Neck Pain: No  Tinnitus: Yes  Review of Systems: No fevers or chills  Review of History: History of migraines.  No history of anxiety or depression.  Family history of depression managed with Prozac in an uncle.  Objective:    Physical Examination Vitals:   09/30/20 1114  BP: 133/88  Pulse: 100  SpO2: 100%    MSK: Normal cervical motion Neuro: Coordination and gait Psych: Normal speech thought process and affect.  Expresses irritability.    Assessment and Plan   35 y.o. female with concussion.   Migraine type much improved with Topamax.  Patient is experiencing some paresthesias that may be related to side effects from high-dose Topamax.  If these are bothersome reduce Topamax dose to 75 mg twice daily.  Recheck 6 weeks.  Irritability: Secondary to concussion.  Discussed options.  Start Prozac at 10 mg titrating to 20.  Report back in 2 weeks and check back in in 6 weeks.  Memory and concentration: If not improved with Prozac and consider ADHD type medications.  Reassess in 6 weeks.     Action/Discussion: Reviewed diagnosis, management options, expected outcomes, and the reasons for scheduled and emergent follow-up. Questions were adequately answered. Patient expressed verbal understanding and agreement with the following plan.     Patient Education:  Reviewed with patient the risks (i.e, a repeat concussion, post-concussion syndrome, second-impact syndrome) of returning to play prior to complete resolution, and thoroughly reviewed the signs and symptoms of concussion.Reviewed need for complete resolution of all symptoms, with rest AND exertion, prior to return to play.  Reviewed red flags for urgent medical evaluation: worsening symptoms, nausea/vomiting, intractable headache, musculoskeletal changes, focal neurological deficits.  [facility] Care Plan, which clearly outlines  the plans stated above, was given to patient.   In addition to the time spent performing tests, I spent 20 min   Reviewed with patient the risks (i.e, a repeat concussion, post-concussion syndrome, second-impact syndrome) of returning to play prior to complete resolution, and thoroughly reviewed the signs and symptoms of      concussion. Reviewedf need for complete resolution of all  symptoms, with rest AND exertion, prior to return to play.  Reviewed red flags for urgent medical evaluation: worsening symptoms, nausea/vomiting, intractable headache, musculoskeletal changes, focal neurological deficits.  [facility] Care Plan, which clearly outlines the plans stated above, was given to patient   After Visit Summary printed out and provided to patient as appropriate.  The above documentation has been reviewed and is accurate and complete Tara Mahoney

## 2020-09-30 ENCOUNTER — Ambulatory Visit: Payer: Medicaid Other | Admitting: Family Medicine

## 2020-09-30 ENCOUNTER — Other Ambulatory Visit: Payer: Self-pay

## 2020-09-30 VITALS — BP 133/88 | HR 100 | Ht 63.0 in | Wt 151.0 lb

## 2020-09-30 DIAGNOSIS — R454 Irritability and anger: Secondary | ICD-10-CM

## 2020-09-30 DIAGNOSIS — S060X0D Concussion without loss of consciousness, subsequent encounter: Secondary | ICD-10-CM

## 2020-09-30 DIAGNOSIS — G43719 Chronic migraine without aura, intractable, without status migrainosus: Secondary | ICD-10-CM

## 2020-09-30 MED ORDER — FLUOXETINE HCL 10 MG PO CAPS
ORAL_CAPSULE | ORAL | 1 refills | Status: DC
Start: 2020-09-30 — End: 2020-12-04

## 2020-09-30 NOTE — Patient Instructions (Signed)
Thank you for coming in today.  Add prozac.  Continue topomax.   Let me know how things go in about 2 weeks with a phone call (not visit)  Recheck with me in 6 weeks.   Let me know if you need refills before then.

## 2020-10-20 ENCOUNTER — Encounter: Payer: Self-pay | Admitting: Family Medicine

## 2020-11-10 NOTE — Progress Notes (Deleted)
Subjective:    Chief Complaint: Tara Mahoney,  is a 35 y.o. female who presents for Concussion  ***  Injury date : *** Visit #: ***   History of Present Illness:    Concussion Self-Reported Symptom Score Symptoms rated on a scale 1-6, in last 24 hours   Headache: ***    Nausea: ***  Dizziness: ***  Vomiting: ***  Balance Difficulty: ***   Trouble Falling Asleep: ***   Fatigue: ***  Sleep Less Than Usual: ***  Daytime Drowsiness: ***  Sleep More Than Usual: ***  Photophobia: ***  Phonophobia: ***  Irritability: ***  Sadness: ***  Numbness or Tingling: ***  Nervousness: ***  Feeling More Emotional: ***  Feeling Mentally Foggy: ***  Feeling Slowed Down: ***  Memory Problems: ***  Difficulty Concentrating: ***  Visual Problems: ***   Total Symptom Score: *** Previous Symptom Score: ***   Neck Pain: Yes/No  Tinnitus: Yes/No  Review of Systems:  ***    Review of History: ***  Objective:    Physical Examination There were no vitals filed for this visit. MSK:  *** Neuro: *** Psych: ***   Concussion testing performed today:  I spent *** minutes with patient discussing test and results including review of history and patient chart and  integration of patient data, interpretation of standardized test results and clinical data, clinical decision making, treatment planning and report,and interactive feedback to the patient with all of patients questions answered.    Neurocognitive testing (ImPACT):  Post #1: *** Post #2: *** Post #3: ***  Verbal Memory Composite *** (***%) *** (***%) *** (***%)  Visual Memory Composite *** (***%) *** (***%) *** (***%)  Visual Motor Speed Composite *** (***%) *** (***%) *** (***%)  Reaction Time Composite *** (***%) *** (***%) *** (***%)  Cognitive Efficiency Index *** ***  ***   Vestibular Screening:   Pre VOMS  HA Score: *** Pre VOMS  Dizziness Score: ***   Headache  Dizziness  Smooth Pursuits *** ***  H.  Saccades *** ***  V. Saccades *** ***  H. VOR *** ***  V. VOR *** ***  Visual Motor Sensitivity *** ***      Convergence: *** cm  *** ***   Balance Screen: ***  Additional testing performed today:  Assessment and Plan   35 y.o. female with ***  Tara Mahoney presents with the following concussion subtypes. [] Cognitive [] Cervical [] Vestibular [] Ocular [] Migraine [] Anxiety/Mood   ***    Action/Discussion: Reviewed diagnosis, management options, expected outcomes, and the reasons for scheduled and emergent follow-up. Questions were adequately answered. Patient expressed verbal understanding and agreement with the following plan.     Patient Education:  Reviewed with patient the risks (i.e, a repeat concussion, post-concussion syndrome, second-impact syndrome) of returning to play prior to complete resolution, and thoroughly reviewed the signs and symptoms of concussion.Reviewed need for complete resolution of all symptoms, with rest AND exertion, prior to return to play.  Reviewed red flags for urgent medical evaluation: worsening symptoms, nausea/vomiting, intractable headache, musculoskeletal changes, focal neurological deficits.  [facility]'s Concussion Care Plan, which clearly outlines the plans stated above, was given to patient.   In addition to the time spent performing tests, I spent *** min   Reviewed with patient the risks (i.e, a repeat concussion, post-concussion syndrome, second-impact syndrome) of returning to play prior to complete resolution, and thoroughly reviewed the signs and symptoms of      concussion. Reviewedf need for complete resolution of  all symptoms, with rest AND exertion, prior to return to play.  Reviewed red flags for urgent medical evaluation: worsening symptoms, nausea/vomiting, intractable headache, musculoskeletal changes, focal neurological deficits.  [facility] Concussion Care Plan, which clearly  outlines the plans stated above, was given to patient   After Visit Summary printed out and provided to patient as appropriate.  The above documentation has been reviewed and is accurate and complete Christoper Fabian

## 2020-11-11 ENCOUNTER — Ambulatory Visit: Payer: Medicaid Other | Admitting: Family Medicine

## 2020-12-04 ENCOUNTER — Encounter: Payer: Self-pay | Admitting: Family Medicine

## 2020-12-04 MED ORDER — FLUOXETINE HCL 20 MG PO CAPS
20.0000 mg | ORAL_CAPSULE | Freq: Every day | ORAL | 1 refills | Status: DC
Start: 2020-12-04 — End: 2020-12-05

## 2020-12-04 MED ORDER — SUMATRIPTAN SUCCINATE 50 MG PO TABS: 100.0000 mg | ORAL_TABLET | ORAL | 3 refills | Status: DC | PRN

## 2020-12-05 ENCOUNTER — Telehealth: Payer: Self-pay | Admitting: Family Medicine

## 2020-12-05 MED ORDER — SUMATRIPTAN SUCCINATE 50 MG PO TABS: 100.0000 mg | ORAL_TABLET | ORAL | 3 refills | Status: DC | PRN

## 2020-12-05 MED ORDER — FLUOXETINE HCL 20 MG PO CAPS: 20.0000 mg | ORAL_CAPSULE | Freq: Every day | ORAL | 1 refills | Status: DC

## 2020-12-05 NOTE — Telephone Encounter (Signed)
Clarified sumatriptan dose

## 2020-12-05 NOTE — Addendum Note (Signed)
Addended by: Rodolph Bong on: 12/05/2020 08:17 AM   Modules accepted: Orders

## 2021-01-30 ENCOUNTER — Encounter: Payer: Self-pay | Admitting: Family Medicine

## 2021-01-30 ENCOUNTER — Other Ambulatory Visit: Payer: Self-pay

## 2021-01-30 MED ORDER — TOPIRAMATE 100 MG PO TABS: 100.0000 mg | ORAL_TABLET | Freq: Two times a day (BID) | ORAL | 2 refills | Status: DC

## 2021-04-13 ENCOUNTER — Encounter: Payer: Self-pay | Admitting: Family Medicine

## 2021-04-14 MED ORDER — SUMATRIPTAN SUCCINATE 50 MG PO TABS: 100.0000 mg | ORAL_TABLET | ORAL | 12 refills | Status: DC | PRN

## 2021-05-13 ENCOUNTER — Other Ambulatory Visit: Payer: Self-pay | Admitting: Family Medicine

## 2021-05-14 NOTE — Telephone Encounter (Signed)
Rx refill request approved per Dr. Corey's orders. 

## 2021-05-29 ENCOUNTER — Encounter: Payer: Self-pay | Admitting: Family Medicine

## 2021-06-18 ENCOUNTER — Encounter: Payer: Self-pay | Admitting: Family Medicine

## 2021-06-18 ENCOUNTER — Other Ambulatory Visit: Payer: Self-pay | Admitting: Family Medicine

## 2021-06-18 NOTE — Telephone Encounter (Signed)
Rx refill request approved per Dr. Corey's orders. 

## 2021-06-19 NOTE — Telephone Encounter (Signed)
Looks like my staff did it yesterday! Tara Mahoney

## 2021-07-25 ENCOUNTER — Emergency Department (HOSPITAL_COMMUNITY)
Admission: EM | Admit: 2021-07-25 | Discharge: 2021-07-25 | Disposition: A | Discharge status: Home / Self Care | Payer: BC Managed Care – PPO | Attending: Emergency Medicine | Admitting: Emergency Medicine

## 2021-07-25 ENCOUNTER — Other Ambulatory Visit: Payer: Self-pay

## 2021-07-25 ENCOUNTER — Encounter (HOSPITAL_COMMUNITY): Payer: Self-pay

## 2021-07-25 ENCOUNTER — Emergency Department (HOSPITAL_COMMUNITY): Payer: BC Managed Care – PPO

## 2021-07-25 DIAGNOSIS — R0602 Shortness of breath: Secondary | ICD-10-CM | POA: Diagnosis not present

## 2021-07-25 DIAGNOSIS — R0789 Other chest pain: Secondary | ICD-10-CM

## 2021-07-25 DIAGNOSIS — R079 Chest pain, unspecified: Secondary | ICD-10-CM | POA: Insufficient documentation

## 2021-07-25 DIAGNOSIS — I493 Ventricular premature depolarization: Secondary | ICD-10-CM

## 2021-07-25 LAB — BASIC METABOLIC PANEL
Anion gap: 8 (ref 5–15)
BUN: 15 mg/dL (ref 6–20)
CO2: 19 mmol/L — ABNORMAL LOW (ref 22–32)
Calcium: 8 mg/dL — ABNORMAL LOW (ref 8.9–10.3)
Chloride: 112 mmol/L — ABNORMAL HIGH (ref 98–111)
Creatinine, Ser: 0.82 mg/dL (ref 0.44–1.00)
GFR, Estimated: >60 mL/min (ref >60)
Glucose, Bld: 82 mg/dL (ref 70–99)
Potassium: 3.3 mmol/L — ABNORMAL LOW (ref 3.5–5.1)
Sodium: 139 mmol/L (ref 135–145)

## 2021-07-25 LAB — CBC
HCT: 43.4 % (ref 36.0–46.0)
Hemoglobin: 14.1 g/dL (ref 12.0–15.0)
MCH: 30.1 pg (ref 26.0–34.0)
MCHC: 32.5 g/dL (ref 30.0–36.0)
MCV: 92.7 fL (ref 80.0–100.0)
Platelets: 147 10*3/uL — ABNORMAL LOW (ref 150–400)
RBC: 4.68 MIL/uL (ref 3.87–5.11)
RDW: 12.7 % (ref 11.5–15.5)
WBC: 5.1 10*3/uL (ref 4.0–10.5)
nRBC: 0 % (ref 0.0–0.2)

## 2021-07-25 LAB — CBG MONITORING, ED: Glucose-Capillary: 83 mg/dL (ref 70–99)

## 2021-07-25 LAB — MAGNESIUM: Magnesium: 1.9 mg/dL (ref 1.7–2.4)

## 2021-07-25 LAB — TROPONIN I (HIGH SENSITIVITY)
Troponin I (High Sensitivity): 4 ng/L (ref <18)
Troponin I (High Sensitivity): 6 ng/L (ref <18)

## 2021-07-25 MED ORDER — ONDANSETRON HCL 4 MG/2ML IJ SOLN
4.0000 mg | Freq: Once | INTRAMUSCULAR | Status: AC
  Administered 2021-07-25: 4 mg via INTRAVENOUS
  Filled 2021-07-25: qty 2

## 2021-07-25 MED ORDER — FENTANYL CITRATE PF 50 MCG/ML IJ SOSY
50.0000 ug | PREFILLED_SYRINGE | Freq: Once | INTRAMUSCULAR | Status: AC
  Administered 2021-07-25: 50 ug via INTRAVENOUS
  Filled 2021-07-25: qty 1

## 2021-07-25 MED ORDER — MORPHINE SULFATE (PF) 4 MG/ML IV SOLN
4.0000 mg | Freq: Once | INTRAVENOUS | Status: DC
Start: 2021-07-25 — End: 2021-07-25
  Filled 2021-07-25: qty 1

## 2021-07-25 MED ORDER — ASPIRIN 81 MG PO CHEW
324.0000 mg | CHEWABLE_TABLET | Freq: Once | ORAL | Status: AC
  Administered 2021-07-25: 324 mg via ORAL
  Filled 2021-07-25: qty 4

## 2021-07-25 MED ORDER — POTASSIUM CHLORIDE CRYS ER 20 MEQ PO TBCR
20.0000 meq | EXTENDED_RELEASE_TABLET | Freq: Once | ORAL | Status: AC
Start: 2021-07-25 — End: 2021-07-25
  Administered 2021-07-25: 20 meq via ORAL
  Filled 2021-07-25: qty 1

## 2021-07-25 NOTE — ED Triage Notes (Signed)
Patient complaining of left chest pain that was sharp, stabbing and has now feels like chest pressure. Cardiac hx. States that at time she felt SOB and was diaphoretic. States that she could feel tingling in fingers of left arm when she felt stabbing pain.

## 2021-07-25 NOTE — ED Provider Notes (Signed)
[facility] EMERGENCY DEPARTMENT Provider Note   CSN: [id number] Arrival date & time: 07/25/21  1325     History  Chief Complaint  Patient presents with   Chest Pain    Tara Mahoney is a 36 y.o. female with history including POTS syndrome, mitral valve prolapse, history of palpitations including PVC and PAC presenting for evaluation of chest pain which started around noon today.  She was taking down her Christmas tree ornaments when she developed intermittent sharp stabs of pain located to the left of her sternum, lasting about 30 seconds then resolving.  She has had multiple episodes of the symptoms prior to arriving here, at least 5, associated with shortness of breath, she initially felt diaphoretic as well.  She had 1 episode when she felt tingling into her left arm to her fingertips.  Her symptoms are currently completely resolved.  She is previously been under the care of Dr. Caryl Comes for this condition, has not seen him in over a year secondary to insurance issues.  She denies nausea or vomiting, palpitations with these events and she is currently symptom-free.  She has had no treatment prior to arrival.  The history is provided by the patient.      Home Medications Prior to Admission medications   Medication Sig Start Date End Date Taking? Authorizing Provider  FLUoxetine (PROZAC) 20 MG capsule Take 1 capsule by mouth once daily 06/18/21  Yes Gregor Hams, MD  Multiple Vitamin (MULTIVITAMIN WITH MINERALS) TABS tablet Take 1 tablet by mouth daily.   Yes [provider]  promethazine (PHENERGAN) 25 MG tablet Take 1 tablet (25 mg total) by mouth every 6 (six) hours as needed for nausea or vomiting. 08/14/20  Yes Gregor Hams, MD  SUMAtriptan (IMITREX) 50 MG tablet Take 2 tablets (100 mg total) by mouth every 2 (two) hours as needed for migraine. May repeat in 2 hours if headache persists or recurs.  Max dose 200 mg/day 04/14/21  Yes Gregor Hams, MD  topiramate (TOPAMAX) 100 MG  tablet Take 1 tablet by mouth twice daily 06/18/21  Yes Gregor Hams, MD  Carboxymethylcellulose Sodium (EYE DROPS OP) Apply 1-2 drops to eye once. For redness/eye irritation Patient not taking: Reported on 08/14/2020    [provider]  flecainide (TAMBOCOR) 150 MG tablet TAKE ONE-HALF TABLET (75 MG TOTAL) BY MOUTH TWICE A DAY Patient not taking: Reported on 08/14/2020 11/03/18   Deboraha Sprang, MD      Allergies    Carvedilol, Definity [perflutren lipid microsphere], Hydrocodone-acetaminophen, Morphine and related, Verapamil, Atenolol, Cardizem cd [diltiazem hcl er beads], Metoprolol, and Nebivolol    Review of Systems   Review of Systems  Constitutional:  Positive for diaphoresis. Negative for fever.  HENT:  Negative for congestion and sore throat.   Eyes: Negative.   Respiratory:  Positive for shortness of breath. Negative for chest tightness.   Cardiovascular:  Positive for chest pain.  Gastrointestinal:  Negative for abdominal pain, nausea and vomiting.  Genitourinary: Negative.   Musculoskeletal:  Negative for arthralgias, joint swelling and neck pain.  Skin: Negative.  Negative for rash and wound.  Neurological:  Negative for dizziness, weakness, light-headedness, numbness and headaches.  Psychiatric/Behavioral: Negative.    All other systems reviewed and are negative.  Physical Exam Updated Vital Signs BP 123/74    Pulse 85    Temp 98.3 F (36.8 C)    Resp 16    Ht 5\' 3"  (1.6 m)  Wt 54.4 kg    LMP 05/04/2011    SpO2 100%    BMI 21.26 kg/m  Physical Exam Vitals and nursing note reviewed.  Constitutional:      Appearance: She is well-developed.  HENT:     Head: Normocephalic and atraumatic.  Eyes:     Conjunctiva/sclera: Conjunctivae normal.  Cardiovascular:     Rate and Rhythm: Normal rate and regular rhythm.     Heart sounds: Normal heart sounds.  Pulmonary:     Effort: Pulmonary effort is normal.     Breath sounds: Normal breath sounds. No wheezing or  rhonchi.  Abdominal:     General: Bowel sounds are normal.     Palpations: Abdomen is soft.     Tenderness: There is no abdominal tenderness.  Musculoskeletal:        General: Normal range of motion.     Cervical back: Normal range of motion.     Right lower leg: No edema.     Left lower leg: No edema.  Skin:    General: Skin is warm and dry.  Neurological:     Mental Status: She is alert.    ED Results / Procedures / Treatments   Labs (all labs ordered are listed, but only abnormal results are displayed) Labs Reviewed  BASIC METABOLIC PANEL - Abnormal; Notable for the following components:      Result Value   Potassium 3.3 (*)    Chloride 112 (*)    CO2 19 (*)    Calcium 8.0 (*)    All other components within normal limits  CBC - Abnormal; Notable for the following components:   Platelets 147 (*)    All other components within normal limits  MAGNESIUM  CBG MONITORING, ED  TROPONIN I (HIGH SENSITIVITY)  TROPONIN I (HIGH SENSITIVITY)    EKG None  Radiology DG Chest 2 View  Result Date: 07/25/2021 CLINICAL DATA:  Left-sided chest pain.  Some shortness of breath. EXAM: CHEST - 2 VIEW COMPARISON:  09/29/2016. FINDINGS: Normal heart, mediastinum and hila. Clear lungs.  No pleural effusion or pneumothorax. Skeletal structures are unremarkable. IMPRESSION: No active cardiopulmonary disease. Electronically Signed   By: Lajean Manes M.D.   On: 07/25/2021 14:30    Procedures Procedures    Medications Ordered in ED Medications  potassium chloride SA (KLOR-CON M) CR tablet 20 mEq (has no administration in time range)  aspirin chewable tablet 324 mg (324 mg Oral Given 07/25/21 1421)  ondansetron (ZOFRAN) injection 4 mg (4 mg Intravenous Given 07/25/21 1422)  fentaNYL (SUBLIMAZE) injection 50 mcg (50 mcg Intravenous Given 07/25/21 1428)    ED Course/ Medical Decision Making/ A&P                           Medical Decision Making  This patient presents to the ED for chief  complaint of chest pain and palpitation, this involves an extensive number of treatment options. The differential diagnosis includes ACS, non stemi, arrhythmia, electrolyte abnormality, PE, pericarditis.   Co morbidities that complicate the patient evaluation  H/o PVCs and PACs, autonomic dysfunction.  History of sinus tachycardia.  Additional history obtained:  Additional history obtained from chart No external records were reviewed Lab Tests:  I Ordered, and personally interpreted labs.  The pertinent results include: Electrolytes and delta troponins.  She does have a mild hypokalemia with a potassium of 3.3.  She was given oral replacement.  Magnesium is currently pending.  She also has a modestly low calcium level at 8.0, this should not be causing any of her current symptoms however.  Her magnesium level is normal range.  Delta troponins are negative.  Imaging Studies ordered:  I ordered imaging studies including chest x-ray I independently visualized and interpreted imaging which showed no active cardiopulmonary disease. I agree with the radiologist interpretation  Cardiac Monitoring:  The patient was maintained on a cardiac monitor.  I personally viewed and interpreted the cardiac monitored which showed an underlying rhythm of: Normal sinus rhythm with episodes of bigeminy captured approximately 2 hours apart.  Medicines ordered and prescription drug management:  I ordered medication including fentanyl and Zofran, aspirin for symptom relief Reevaluation of the patient after these medicines showed that the patient resolved I have reviewed the patients home medicines and have made adjustments as needed   Critical Interventions:  N/A  Consultations Obtained:  No consultations were ordered.  Patient is stable for discharge home with close outpatient follow-up. Reevaluation:  After the interventions noted above, I reevaluated the patient and found that they have :improved.   However she had 2 fleeting episodes of palpitations while here, review of the monitor shows that she had 2 isolated episodes of bigeminy consistent with the timing of her symptoms.  Social Determinants of Health:  N/a  Disposition:  After consideration of the diagnostic results and the patients response to treatment, I feel that the most appropriate treatment course includes close outpatient f/u with her cardiologist Dr. Caryl Comes.  She has been off of her flecainide for more than a year, however she is motivated to reestablish care with him and to get back on this medication.  This is the most appropriate disposition at this time, she is currently symptom-free.  She was given return precautions regarding recheck here if she develops escalating symptoms.  Patient understands and is agreeable with this plan.  There is no evidence for unstable angina, she had delta troponins completed which are negative.         Final Clinical Impression(s) / ED Diagnoses Final diagnoses:  None    Rx / DC Orders ED Discharge Orders     None         Landis Martins 07/25/21 1752    Milton Ferguson, MD 07/26/21 1213

## 2021-07-25 NOTE — ED Notes (Addendum)
Patient to xray at this time

## 2021-07-25 NOTE — Discharge Instructions (Signed)
Return here for further evaluation if your symptoms return or become more frequent.

## 2021-07-28 ENCOUNTER — Other Ambulatory Visit: Payer: Self-pay | Admitting: Family Medicine

## 2021-07-29 MED ORDER — TOPIRAMATE 100 MG PO TABS
100.0000 mg | ORAL_TABLET | Freq: Two times a day (BID) | ORAL | 3 refills | Status: DC
Start: 2021-07-29 — End: 2021-09-16

## 2021-08-21 ENCOUNTER — Other Ambulatory Visit: Payer: Self-pay

## 2021-08-21 ENCOUNTER — Ambulatory Visit: Payer: BC Managed Care – PPO | Admitting: Internal Medicine

## 2021-08-21 ENCOUNTER — Encounter: Payer: Self-pay | Admitting: Internal Medicine

## 2021-08-21 VITALS — BP 104/64 | HR 86 | Ht 63.0 in | Wt 126.8 lb

## 2021-08-21 DIAGNOSIS — R000 Tachycardia, unspecified: Secondary | ICD-10-CM

## 2021-08-21 DIAGNOSIS — G909 Disorder of the autonomic nervous system, unspecified: Secondary | ICD-10-CM

## 2021-08-21 DIAGNOSIS — I4729 Other ventricular tachycardia: Secondary | ICD-10-CM

## 2021-08-21 DIAGNOSIS — R002 Palpitations: Secondary | ICD-10-CM | POA: Diagnosis not present

## 2021-08-21 MED ORDER — MAGNESIUM OXIDE 400 MG PO CAPS: 400.0000 mg | ORAL_CAPSULE | Freq: Every day | ORAL | 0 refills | Status: AC

## 2021-08-21 NOTE — Patient Instructions (Addendum)
Medication Instructions:  Your physician has recommended you make the following change in your medication:   ** Begin Magnesium Oxide 400mg  - 1 tablet by mouth daily - OTC   *If you need a refill on your cardiac medications before your next appointment, please call your pharmacy*   Lab Work:  BMET and Mg today If you have labs (blood work) drawn today and your tests are completely normal, you will receive your results only by: MyChart Message (if you have MyChart) OR A paper copy in the mail If you have any lab test that is abnormal or we need to change your treatment, we will call you to review the results.   Testing/Procedures: None ordered.    Follow-Up: At [facility], you and your health needs are our priority.  As part of our continuing mission to provide you with exceptional heart care, we have created designated Provider Care Teams.  These Care Teams include your primary Cardiologist (physician) and Advanced Practice Providers (APPs -  Physician Assistants and Nurse Practitioners) who all work together to provide you with the care you need, when you need it.  We recommend signing up for the patient portal called "MyChart".  Sign up information is provided on this After Visit Summary.  MyChart is used to connect with patients for Virtual Visits (Telemedicine).  Patients are able to view lab/test results, encounter notes, upcoming appointments, etc.  Non-urgent messages can be sent to your provider as well.   To learn more about what you can do with MyChart, go to [facility].    Your next appointment:   Telehealth visit in 4 weeks - I will have Dr [email or web address] scheduler call you with this appointment

## 2021-08-21 NOTE — Progress Notes (Signed)
ELECTROPHYSIOLOGY OFFICE NOTE  Patient ID: Tara Mahoney, MRN: [id number], DOB/AGE: 1985/10/12 36 y.o. Admit date: (Not on file) Date of Consult: 08/21/2021  Primary Physician: Alanson Puls [facility] Primary Cardiologist:       Nylee Hillard is a 36 y.o. female who is being seen today for the evaluation of palpitations .    HPI [name] is a 36 y.o. female seen after hiatus of a number of years.  Previously seen for PVCs and PACs nonsustained ventricular tachycardia in the context of a mild cardiomyopathy with a cMRI concerned about noncompaction but was never confirmed.  Flecainide had been used with only modest benefit   also symptoms of orthostatic shower and heat intolerance and exercise associated palpitations for which she previously been treated with salt and fluids with some improvement. DATE TEST EF   8/17 cMRI 48 % ?  Noncompaction  9/17 Echo  50-55 %   3/18 Echo  55 %         Date Cr K Hgb  1/23 0.82 3.3 14.1        About 1 month ago, she initially developed very sharp chest pain while putting Christmas decorations away. She notes this pain took her breath away. Additionally, she felt like her heart was beating much harder like a "boom".  Her heart rate was elevated, but she endorses having a tachycardic rhythm at baseline.  Currently she still has these intermittent sharp, aching pains lasting for seconds at a time. These episodes continue to cause shortness of breath, and hard heart beats. Her palpitations are occurring "nonstop daily." At her last visit, she was feeling racing heart beats with her palpitations. Now, her heart beat is not racing as prior but she has developed associated pain. Since 1.5 months ago she has restarted flecainide.  Rarely she suffers from dizziness. This is not overly bothersome for her. She no longer takes hot showers in order to avoid dizziness.  She generally comes home feeling very fatigued, and will fall asleep easily but is  also awakening at 4 am   Her urine is clear, and she only drinks water. She is taking one OTC salt pill per day.  The patient denies nocturnal dyspnea, orthopnea or peripheral edema.  There have been no lightheadedness or syncope.  Complains of stress.  Past Medical History:  Diagnosis Date   Blood transfusion without reported diagnosis    Complication of anesthesia    Dysautonomia ([place])    sinus arrythmia   Family history of anesthesia complication    PONV   [id number])    Heart murmur    MVP (mitral valve prolapse)    PONV (postoperative nausea and vomiting)    Ventricular tachycardia, nonsustained       Surgical History:  Past Surgical History:  Procedure Laterality Date   ABDOMINAL HYSTERECTOMY     CERVICAL CERCLAGE  2008   CERVICAL CERCLAGE  09/08/2011   Procedure: CERCLAGE CERVICAL;  Surgeon: Florian Buff, MD;  Location: [facility];  Service: Gynecology;  Laterality: N/A;  [name],CST-scrubbed; doctor scrubbed in @ [place]  08/09/2012   Procedure: LAPAROSCOPIC LYSIS OF ADHESIONS;  Surgeon: Florian Buff, MD;  Location: [facility];  Service: Gynecology;  Laterality: N/A;   LAPAROSCOPY  08/09/2012   Procedure: LAPAROSCOPY DIAGNOSTIC;  Surgeon: Florian Buff, MD;  Location: [facility];  Service: Gynecology;  Laterality: N/A;  started at  1208-Vaginal trachelectomy (vaginal removal of cervical  stump)    MYRINGOTOMY     SUPRACERVICAL ABDOMINAL HYSTERECTOMY  01/26/2012   Procedure: HYSTERECTOMY SUPRACERVICAL ABDOMINAL;  Surgeon: Florian Buff, MD;  Location: [facility];  Service: Gynecology;;  converted [phone]     Home Meds: Current Meds  Medication Sig   flecainide (TAMBOCOR) 50 MG tablet Take 50 mg by mouth 2 (two) times daily.   FLUoxetine (PROZAC) 20 MG capsule Take 1 capsule by mouth once daily   Magnesium Oxide 400 MG CAPS Take 1 capsule (400 mg total) by mouth daily.   Multiple Vitamin (MULTIVITAMIN WITH MINERALS) TABS tablet Take 1 tablet by  mouth daily.   promethazine (PHENERGAN) 25 MG tablet Take 1 tablet (25 mg total) by mouth every 6 (six) hours as needed for nausea or vomiting.   SUMAtriptan (IMITREX) 50 MG tablet Take 2 tablets (100 mg total) by mouth every 2 (two) hours as needed for migraine. May repeat in 2 hours if headache persists or recurs.  Max dose 200 mg/day   topiramate (TOPAMAX) 100 MG tablet Take 1 tablet (100 mg total) by mouth 2 (two) times daily.    Allergies:  Allergies  Allergen Reactions   Carvedilol Hives and Itching   Definity [Perflutren Lipid Microsphere] Other (See Comments)    Headache   Hydrocodone-Acetaminophen Itching and Nausea And Vomiting   Morphine And Related Nausea And Vomiting   Verapamil     Racing heart, dizzyness, headache,shaking all over her body    Atenolol Rash and Other (See Comments)   Cardizem Cd [Diltiazem Hcl Er Beads] Rash   Metoprolol Rash    Headaches, dizzyness   Nebivolol Rash    Social History   Socioeconomic History   Marital status: Married    Spouse name: Not on file   Number of children: 1   Years of education: Not on file   Highest education level: Not on file  Occupational History   Occupation: unemployed  Tobacco Use   Smoking status: Former    Years: 1.00    Types: Cigarettes    Start date: 10/01/2001    Quit date: 09/01/2009    Years since quitting: 11.9   Smokeless tobacco: Never   Tobacco comments:    smokes 1 pack a week  Substance and Sexual Activity   Alcohol use: No    Alcohol/week: 0.0 standard drinks   Drug use: No   Sexual activity: Yes    Partners: Male    Birth control/protection: Surgical  Other Topics Concern   Not on file  Social History Narrative   Not on file   Social Determinants of Health   Financial Resource Strain: Not on file  Food Insecurity: Not on file  Transportation Needs: Not on file  Physical Activity: Not on file  Stress: Not on file  Social Connections: Not on file  Intimate Partner Violence: Not  on file     Family History  Problem Relation Age of Onset   Hypertension Brother    Heart disease Brother        hole in heart   Ovarian cancer Mother    Anesthesia problems Neg Hx    Hypotension Neg Hx    Pseudochol deficiency Neg Hx    Malignant hyperthermia Neg Hx    Other Neg Hx      ROS:  Please see the history of present illness.     All other systems reviewed and negative.    Physical Exam: Blood pressure 104/64, pulse 86, height 5\' 3"  (  1.6 m), weight 126 lb 12.8 oz (57.5 kg), last menstrual period 05/04/2011, SpO2 97 %. General: Well developed, well nourished female in no acute distress. Head: Normocephalic, atraumatic, sclera non-icteric, no xanthomas, nares are without discharge. EENT: normal  Lymph Nodes:  none Neck: Negative for carotid bruits. JVD not elevated. Back:without scoliosis kyphosis  Lungs: Clear bilaterally to auscultation without wheezes, rales, or rhonchi. Breathing is unlabored. Heart: RRR with S1 S2. No murmur . No rubs, or gallops appreciated. Abdomen: Soft, non-tender, non-distended with normoactive bowel sounds. No hepatomegaly. No rebound/guarding. No obvious abdominal masses. Msk:  Strength and tone appear normal for age. Extremities: No clubbing or cyanosis. No edema.  Distal pedal pulses are 2+ and equal bilaterally. Skin: Warm and Dry Neuro: Alert and oriented X 3. CN III-XII intact Grossly normal sensory and motor function . Psych:  Responds to questions appropriately with a normal affect.        EKG: Sinus at 86 Interval 16/08/40   Assessment and Plan:  PVCs  Orthostatic tachycardia  Stress  Hypokalemia    new symptoms seem related to PVCs based on the observations made in the emergency room and her description.  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  Her potassium was low.  We will recheck it today.  We will begin her on mag oxide which may help mitigate some of her symptoms.  Flecainide 50 twice daily as been less effective than the 75 twice daily was previously; and up titration may be appropriate but for right now we will make sure that her potassium magnesium issues are addressed.  Her orthostatic dysautonomia symptoms are fairly quiescient not withstanding the presence of orthostatic tachycardia.  We will increase her sodium intake shooting for 1 g of sodium a day, this would be about 2.5 g of sodium chloride.        Virl Axe

## 2021-08-22 LAB — BASIC METABOLIC PANEL
BUN/Creatinine Ratio: 17 (ref 9–23)
BUN: 14 mg/dL (ref 6–20)
CO2: 22 mmol/L (ref 20–29)
Calcium: 9 mg/dL (ref 8.7–10.2)
Chloride: 108 mmol/L — ABNORMAL HIGH (ref 96–106)
Creatinine, Ser: 0.84 mg/dL (ref 0.57–1.00)
Glucose: 87 mg/dL (ref 70–99)
Potassium: 4.6 mmol/L (ref 3.5–5.2)
Sodium: 142 mmol/L (ref 134–144)
eGFR: 92 mL/min/{1.73_m2} (ref >59)

## 2021-08-22 LAB — MAGNESIUM: Magnesium: 2.1 mg/dL (ref 1.6–2.3)

## 2021-08-29 ENCOUNTER — Encounter (HOSPITAL_COMMUNITY): Payer: Self-pay | Admitting: *Deleted

## 2021-08-29 ENCOUNTER — Emergency Department (HOSPITAL_COMMUNITY)
Admission: EM | Admit: 2021-08-29 | Discharge: 2021-08-29 | Disposition: A | Discharge status: Home / Self Care | Payer: BC Managed Care – PPO | Attending: Emergency Medicine | Admitting: Emergency Medicine

## 2021-08-29 ENCOUNTER — Other Ambulatory Visit: Payer: Self-pay

## 2021-08-29 ENCOUNTER — Emergency Department (HOSPITAL_COMMUNITY): Payer: BC Managed Care – PPO

## 2021-08-29 DIAGNOSIS — R519 Headache, unspecified: Secondary | ICD-10-CM | POA: Diagnosis not present

## 2021-08-29 DIAGNOSIS — R079 Chest pain, unspecified: Secondary | ICD-10-CM | POA: Diagnosis not present

## 2021-08-29 DIAGNOSIS — R000 Tachycardia, unspecified: Secondary | ICD-10-CM | POA: Diagnosis not present

## 2021-08-29 DIAGNOSIS — R002 Palpitations: Secondary | ICD-10-CM | POA: Insufficient documentation

## 2021-08-29 DIAGNOSIS — E876 Hypokalemia: Secondary | ICD-10-CM | POA: Diagnosis not present

## 2021-08-29 LAB — CBC WITH DIFFERENTIAL/PLATELET
Abs Immature Granulocytes: 0.01 10*3/uL (ref 0.00–0.07)
Basophils Absolute: 0 10*3/uL (ref 0.0–0.1)
Basophils Relative: 1 %
Eosinophils Absolute: 0.2 10*3/uL (ref 0.0–0.5)
Eosinophils Relative: 5 %
HCT: 40.5 % (ref 36.0–46.0)
Hemoglobin: 13.7 g/dL (ref 12.0–15.0)
Immature Granulocytes: 0 %
Lymphocytes Relative: 32 %
Lymphs Abs: 1.5 10*3/uL (ref 0.7–4.0)
MCH: 31.7 pg (ref 26.0–34.0)
MCHC: 33.8 g/dL (ref 30.0–36.0)
MCV: 93.8 fL (ref 80.0–100.0)
Monocytes Absolute: 0.2 10*3/uL (ref 0.1–1.0)
Monocytes Relative: 5 %
Neutro Abs: 2.6 10*3/uL (ref 1.7–7.7)
Neutrophils Relative %: 57 %
Platelets: 187 10*3/uL (ref 150–400)
RBC: 4.32 MIL/uL (ref 3.87–5.11)
RDW: 12.6 % (ref 11.5–15.5)
WBC: 4.6 10*3/uL (ref 4.0–10.5)
nRBC: 0 % (ref 0.0–0.2)

## 2021-08-29 LAB — TROPONIN I (HIGH SENSITIVITY)
Troponin I (High Sensitivity): <2 ng/L (ref <18)
Troponin I (High Sensitivity): 2 ng/L (ref <18)

## 2021-08-29 LAB — BASIC METABOLIC PANEL
Anion gap: 7 (ref 5–15)
BUN: 13 mg/dL (ref 6–20)
CO2: 22 mmol/L (ref 22–32)
Calcium: 9.3 mg/dL (ref 8.9–10.3)
Chloride: 109 mmol/L (ref 98–111)
Creatinine, Ser: 0.88 mg/dL (ref 0.44–1.00)
GFR, Estimated: >60 mL/min (ref >60)
Glucose, Bld: 81 mg/dL (ref 70–99)
Potassium: 3.4 mmol/L — ABNORMAL LOW (ref 3.5–5.1)
Sodium: 138 mmol/L (ref 135–145)

## 2021-08-29 LAB — TSH: TSH: 2.574 u[IU]/mL (ref 0.350–4.500)

## 2021-08-29 LAB — HCG, SERUM, QUALITATIVE: Preg, Serum: NEGATIVE

## 2021-08-29 MED ORDER — IBUPROFEN 400 MG PO TABS
400.0000 mg | ORAL_TABLET | Freq: Once | ORAL | Status: AC
Start: 2021-08-29 — End: 2021-08-29
  Administered 2021-08-29: 400 mg via ORAL
  Filled 2021-08-29: qty 1

## 2021-08-29 NOTE — ED Provider Notes (Signed)
[facility] EMERGENCY DEPARTMENT Provider Note   CSN: [id number] Arrival date & time: 08/29/21  1341     History  Chief Complaint  Patient presents with   Chest Pain    Tara Mahoney is a 36 y.o. female who follows with Dr. Sherryl Manges with [facility] heart care episodes of nonsustained ventricular tachycardia POTS who presents today with concern for sharp stabbing type left-sided chest pains with associated palpitations.  Patient chronically on flecainide.  She did see  the cardiologist on February 3 of this year due to sharp stabbing chest pains in the left chest that last from 5 to 10 seconds at a time and occur every 20 minutes around-the-clock according to the patient.  She states she has had symptoms like this for the last 6 weeks without any rest.  She states that the pain does wake her from her sleep.  She states that the cardiologist stated she may need to have an ablation.  Patient called EMS today due to pain in her left chest feeling stabbing in nature.  She was administered nitroglycerin and 325 aspirin in route with significant improvement in her chest pain.  She states it is completely resolved at this time.  She did have episode of reported SVT in route with heart rates in the 150s.  She was able to convert back to normal sinus rhythm with vagal maneuvers.  She does have a headache after administration of nitroglycerin.  I have personally reviewed this patient's medical records.  She has history of PVCs, PACs, nonsustained ventricular tachycardia.  Cardiac MRI in 2017 revealed decrease LVEF of 48% with diffuse hypokinesis predominantly in the apical segments, concerns for noncompaction at that time but follow-up Dopplers were not performed patient did have echocardiogram as recently as March 2018 which revealed 55%.  She does have history of allergies to all beta-blockers.  She is currently on flecainide, magnesium, fluoxetine, Topamax, and salt tablets.  No recent travel or prolonged  immobilization, no recent surgeries, no history of malignancy or blood clot.  HPI     Home Medications Prior to Admission medications   Medication Sig Start Date End Date Taking? Authorizing Provider  flecainide (TAMBOCOR) 50 MG tablet Take 50 mg by mouth 2 (two) times daily. 08/06/21  Yes [provider]  FLUoxetine (PROZAC) 20 MG capsule Take 1 capsule by mouth once daily 06/18/21  Yes Rodolph Bong, MD  Magnesium Oxide 400 MG CAPS Take 1 capsule (400 mg total) by mouth daily. 08/21/21  Yes Duke Salvia, MD  Multiple Vitamin (MULTIVITAMIN WITH MINERALS) TABS tablet Take 1 tablet by mouth daily.   Yes [provider]  promethazine (PHENERGAN) 25 MG tablet Take 1 tablet (25 mg total) by mouth every 6 (six) hours as needed for nausea or vomiting. 08/14/20  Yes Rodolph Bong, MD  SUMAtriptan (IMITREX) 50 MG tablet Take 2 tablets (100 mg total) by mouth every 2 (two) hours as needed for migraine. May repeat in 2 hours if headache persists or recurs.  Max dose 200 mg/day 04/14/21  Yes Rodolph Bong, MD  topiramate (TOPAMAX) 100 MG tablet Take 1 tablet (100 mg total) by mouth 2 (two) times daily. 07/29/21  Yes Rodolph Bong, MD      Allergies    Carvedilol, Definity [perflutren lipid microsphere], Hydrocodone-acetaminophen, Morphine and related, Verapamil, Atenolol, Cardizem cd [diltiazem hcl er beads], Metoprolol, and Nebivolol    Review of Systems   Review of Systems  Constitutional: Negative.  HENT: Negative.    Eyes: Negative.   Respiratory: Negative.    Cardiovascular:  Positive for chest pain and palpitations. Negative for leg swelling.  Gastrointestinal:  Positive for nausea. Negative for abdominal distention, abdominal pain, anal bleeding, blood in stool, constipation, diarrhea and vomiting.  Genitourinary: Negative.   Musculoskeletal: Negative.   Skin: Negative.   Neurological:  Positive for headaches. Negative for dizziness and light-headedness.   Physical  Exam Updated Vital Signs BP 112/80    Pulse 93    Temp 98.6 F (37 C) (Oral)    Resp 16    Ht 5\' 3"  (1.6 m)    Wt 54.4 kg    LMP 05/04/2011    SpO2 100%    BMI 21.26 kg/m  Physical Exam Vitals and nursing note reviewed.  Constitutional:      Appearance: She is not toxic-appearing.  HENT:     Head: Normocephalic and atraumatic.     Nose: Nose normal.     Mouth/Throat:     Mouth: Mucous membranes are moist.     Pharynx: Uvula midline. No oropharyngeal exudate or posterior oropharyngeal erythema.     Tonsils: No tonsillar exudate.  Eyes:     General: Lids are normal. Vision grossly intact.        Right eye: No discharge.        Left eye: No discharge.     Extraocular Movements: Extraocular movements intact.     Conjunctiva/sclera: Conjunctivae normal.     Pupils: Pupils are equal, round, and reactive to light.  Neck:     Trachea: Trachea and phonation normal.  Cardiovascular:     Rate and Rhythm: Normal rate and regular rhythm.     Pulses: Normal pulses.     Heart sounds: Normal heart sounds.  Pulmonary:     Effort: Pulmonary effort is normal. No tachypnea, bradypnea, accessory muscle usage, prolonged expiration or respiratory distress.     Breath sounds: Normal breath sounds. No wheezing or rales.  Chest:     Chest wall: No mass, deformity, tenderness, crepitus or edema. There is no dullness to percussion.  Abdominal:     General: Bowel sounds are normal. There is no distension.     Palpations: Abdomen is soft.     Tenderness: There is no abdominal tenderness. There is no right CVA tenderness, left CVA tenderness, guarding or rebound.  Musculoskeletal:        General: No deformity.     Cervical back: Normal range of motion and neck supple.     Right lower leg: No edema.     Left lower leg: No edema.  Lymphadenopathy:     Cervical: No cervical adenopathy.  Skin:    General: Skin is warm and dry.     Capillary Refill: Capillary refill takes less than 2 seconds.   Neurological:     Mental Status: She is alert. Mental status is at baseline.  Psychiatric:        Mood and Affect: Mood normal.    ED Results / Procedures / Treatments   Labs (all labs ordered are listed, but only abnormal results are displayed) Labs Reviewed  BASIC METABOLIC PANEL - Abnormal; Notable for the following components:      Result Value   Potassium 3.4 (*)    All other components within normal limits  CBC WITH DIFFERENTIAL/PLATELET  TSH  HCG, SERUM, QUALITATIVE  TROPONIN I (HIGH SENSITIVITY)  TROPONIN I (HIGH SENSITIVITY)    EKG EKG Interpretation  Date/Time:  Saturday August 29 2021 13:51:06 EST Ventricular Rate:  96 PR Interval:  161 QRS Duration: 86 QT Interval:  369 QTC Calculation: 467 R Axis:   80 Text Interpretation: Sinus rhythm Confirmed by Fredia Sorrow [phone]) on 08/29/2021 1:53:43 PM  Radiology DG Chest 2 View  Result Date: 08/29/2021 CLINICAL DATA:  Chest pain EXAM: CHEST - 2 VIEW COMPARISON:  07/25/2021 FINDINGS: The heart size and mediastinal contours are within normal limits. Both lungs are clear. No pleural effusion or pneumothorax. The visualized skeletal structures are unremarkable. IMPRESSION: No active cardiopulmonary disease. Electronically Signed   By: Lajean Manes M.D.   On: 08/29/2021 15:46    Procedures Procedures    Medications Ordered in ED Medications - No data to display  ED Course/ Medical Decision Making/ A&P Clinical Course as of 08/29/21 1753  Sat Aug 29, 2021  1644 Consult to cardiologist Dr. Oval Linsey, with [facility] heart care, who agrees that work-up today is benign and there is no indication for admission at this time or transfer to [facility].  She recommends close outpatient follow-up with Dr. Caryl Comes and continuation of her oral medications.  I appreciate her collaboration in the care of this patient. [RS]    Clinical Course User Index [RS] Crestina Strike, Gypsy Balsam, PA-C                           Medical Decision  Making 36 year old female with history of mild cardiomyopathy, PACs, PVCs, and nonsustained ventricular tachycardia who presents with chest pain and palpitations.  Mildly hypertensive on intake, vital signs otherwise normal.  Cardiopulmonary exam is benign, patient is in sinus rhythm on the monitor.  Neurovascular intact in all 4 extremities.  Differential diagnosis includes was limited to PAC, PVC, dysrhythmia, ACS, pleural effusion, PE, pneumothorax.  Amount and/or Complexity of Data Reviewed Labs: ordered.    Details: CBC unremarkable, BMP with mild hypokalemia of 3.4.  Pregnancy test is negative, troponin is negative, less than 2.  TSH is normal at 2.5. Radiology: ordered.    Details: Chest x-ray negative for acute cardiopulmonary disease. ECG/medicine tests:     Details: EKG with normal sinus rhythm with a rate of 96 bpm Discussion of management or test interpretation with external provider(s): Consult to cardiologist who recommends outpatient follow up and discharge home without any medication changes.I appreciate her collaboration in the care of this patient.   Patient's work-up and physical exam very reassuring throughout her stay in the emergency department.   She has remained in normal sinus rhythm on the cardiac monitoring throughout her stay.  At time of my reevaluation the patient she remains chest pain-free without palpitations.  She does have mild headache following administration of nitroglycerin but otherwise is feeling very well.  Clinical suspicion for emergent underlying issue requiring further work-up in the ER or inpatient management is very low.  Per cardiology recommendation work-up today is benign we will have her follow-up in the outpatient setting.  Presentation most consistent with her chronic palpitations.  Tara Mahoney voiced understanding of her medical evaluation and treatment plan.  Each of her questions was answered to her expressed satisfaction.  Return precautions  were given.  Patient is well-appearing, stable, and was discharged in good condition.  This chart was dictated using voice recognition software, Dragon. Despite the best efforts of this provider to proofread and correct errors, errors may still occur which can change documentation meaning.    Final Clinical Impression(s) / ED  Diagnoses Final diagnoses:  None    Rx / DC Orders ED Discharge Orders     None         Aura Dials 08/29/21 1754    Milton Ferguson, MD 08/31/21 1220

## 2021-08-29 NOTE — ED Triage Notes (Signed)
Pt brought in by [facility] from home with c/o chest pain. Pt was given 325mg  ASA and 1 Nitro. After the Nitro was given, pt c/o severe headache and went into SVT with HR 150. Pt converted back to NSR with HR 100 with vagal maneuvers. Pt had nausea this morning. BP 130/70 for EMS. Pt denies CP at this time, but does c/o headache.

## 2021-08-29 NOTE — Discharge Instructions (Signed)
You are seen in the ER today for your chest pain.  Your physical exam and blood work as well as your EKG and chest x-ray were very reassuring.  While the exact cause your symptoms remains unclear does not appear to be any emergent problem at this time.  Please follow-up with Dr. Graciela Husbands as soon as possible next week for reevaluation.  Return to the ER with any new severe symptoms.

## 2021-08-29 NOTE — ED Notes (Signed)
Pt ambulated to and from bathroom without complaints or complications. Nurse notified.

## 2021-08-31 ENCOUNTER — Telehealth: Payer: Self-pay | Admitting: Internal Medicine

## 2021-08-31 NOTE — Telephone Encounter (Signed)
Pt reports continued elevated HRs/SVT issues since last being seen. States it occurs "comes and goes all day, everyday". Symptoms associated are chest pain, light headedness/dizzy and nausea. She went to ED over weekend for this. HR currently 121. BP average low, SBP in low 100s. Pt aware I am forwarding this to RN to review with Dr. Graciela Husbands today on recommendation. Will message RN to be aware to review this.

## 2021-08-31 NOTE — Telephone Encounter (Signed)
Pt is scheduled to see Otilio Saber, PA-C on 09/02/2021 for hospital follow up.

## 2021-08-31 NOTE — Telephone Encounter (Signed)
Have reviewed the ECGs from the ambulance.  I am not clear as to the mechanism of the tachycardia.  There appears to be a biphasic T wave in the anterolateral precordium suggesting a normal PR interval although on the inferior limb leads the interval seems to be much shorter but a distinct P waves seems to be present and if so then it is isoelectric with a negative terminal component to the lead I.  I think the P waves preclude AV reentry so it could be an atrial tachycardia.    the narrative describes Valsalva and change in heart rate.

## 2021-08-31 NOTE — Telephone Encounter (Signed)
STAT if HR is under 50 or over 120 (normal HR is 60-100 beats per minute)  What is your heart rate? Not sure right now  Do you have a log of your heart rate readings (document readings)? 175 this weekend  Do you have any other symptoms? Lightheadedness, dizziness, nausea   Patient states she has been having SVT and was in the ED this weekend. She says it comes and goes and was told by the ED to call first thing Monday morning.

## 2021-09-01 NOTE — Telephone Encounter (Signed)
Spoke with pt who reports she continues to have palpitations and periods of tachycardia.  Pt states she is currently at work.  Pt advised Dr Graciela Husbands has reviewed her EKG's and for pt to plan to keep appointment with Otilio Saber, PA-C scheduled for 09/02/2021.  Pt verbalizes understanding and agrees with current plan.

## 2021-09-01 NOTE — Progress Notes (Signed)
PCP:  Ponciano Ort, [facility] Primary Cardiologist: None Electrophysiologist: Sherryl Manges, MD   Tara Mahoney is a 36 y.o. female seen today for Sherryl Manges, MD for routine electrophysiology followup.  Since last being seen in our clinic the patient reports doing about the same. She did not feel better on higher doses of flecainide.  She has failed verapamil, diltiazem, and most beta blockers. Chronically, she has chest discomfort that worsens with elevated HRs.  She has been managed on flecainide for years, and it has given her some relief from PVCs and PACs.   She has been worried of late that her previously described mitral valve issues have been getting worse. Her chest discomfort ranges from an ache (almost constantly) to stabbing pain with elevated HRs. No clear exertional component.   She states her recent episode involved HR excursions into the 150s, and stabbing chest discomfort, but not necessarily at the same time. She still felt awful on EMS arrival, but HRs 90-100s at that time.   Past Medical History:  Diagnosis Date   Blood transfusion without reported diagnosis    Complication of anesthesia    Dysautonomia (HCC)    sinus arrythmia   Family history of anesthesia complication    PONV   Headache(784.0)    Heart murmur    MVP (mitral valve prolapse)    PONV (postoperative nausea and vomiting)    Ventricular tachycardia, nonsustained    Past Surgical History:  Procedure Laterality Date   ABDOMINAL HYSTERECTOMY     CERVICAL CERCLAGE  2008   CERVICAL CERCLAGE  09/08/2011   Procedure: CERCLAGE CERVICAL;  Surgeon: Lazaro Arms, MD;  Location: [facility];  Service: Gynecology;  Laterality: N/A;  [name],CST-scrubbed; doctor scrubbed in @ [phone]   LAPAROSCOPIC LYSIS OF ADHESIONS  08/09/2012   Procedure: LAPAROSCOPIC LYSIS OF ADHESIONS;  Surgeon: Lazaro Arms, MD;  Location: [facility];  Service: Gynecology;  Laterality: N/A;   LAPAROSCOPY  08/09/2012   Procedure: LAPAROSCOPY  DIAGNOSTIC;  Surgeon: Lazaro Arms, MD;  Location: [facility];  Service: Gynecology;  Laterality: N/A;  started at  1208-Vaginal trachelectomy (vaginal removal of cervical stump)    MYRINGOTOMY     SUPRACERVICAL ABDOMINAL HYSTERECTOMY  01/26/2012   Procedure: HYSTERECTOMY SUPRACERVICAL ABDOMINAL;  Surgeon: Lazaro Arms, MD;  Location: [facility];  Service: Gynecology;;  converted [phone]    Current Outpatient Medications  Medication Sig Dispense Refill   flecainide (TAMBOCOR) 50 MG tablet Take 50 mg by mouth 2 (two) times daily.     FLUoxetine (PROZAC) 20 MG capsule Take 1 capsule by mouth once daily 90 capsule 0   Magnesium Oxide 400 MG CAPS Take 1 capsule (400 mg total) by mouth daily. 90 capsule 0   Multiple Vitamin (MULTIVITAMIN WITH MINERALS) TABS tablet Take 1 tablet by mouth daily.     promethazine (PHENERGAN) 25 MG tablet Take 1 tablet (25 mg total) by mouth every 6 (six) hours as needed for nausea or vomiting. 30 tablet 2   SUMAtriptan (IMITREX) 50 MG tablet Take 2 tablets (100 mg total) by mouth every 2 (two) hours as needed for migraine. May repeat in 2 hours if headache persists or recurs.  Max dose 200 mg/day 9 tablet 12   topiramate (TOPAMAX) 100 MG tablet Take 1 tablet (100 mg total) by mouth 2 (two) times daily. 60 tablet 3   No current facility-administered medications for this visit.    Allergies  Allergen Reactions   Carvedilol Hives and Itching  Definity [Perflutren Lipid Microsphere] Other (See Comments)    Headache   Hydrocodone-Acetaminophen Itching and Nausea And Vomiting   Morphine And Related Nausea And Vomiting   Verapamil     Racing heart, dizzyness, headache,shaking all over her body    Atenolol Rash and Other (See Comments)   Cardizem Cd [Diltiazem Hcl Er Beads] Rash   Metoprolol Rash    Headaches, dizzyness   Nebivolol Rash    Social History   Socioeconomic History   Marital status: Married    Spouse name: Not on file   Number of children: 1   Years of  education: Not on file   Highest education level: Not on file  Occupational History   Occupation: unemployed  Tobacco Use   Smoking status: Former    Years: 1.00    Types: Cigarettes    Start date: 10/01/2001    Quit date: 09/01/2009    Years since quitting: 12.0   Smokeless tobacco: Never   Tobacco comments:    smokes 1 pack a week  Vaping Use   Vaping Use: Never used  Substance and Sexual Activity   Alcohol use: No    Alcohol/week: 0.0 standard drinks   Drug use: No   Sexual activity: Yes    Partners: Male    Birth control/protection: Surgical  Other Topics Concern   Not on file  Social History Narrative   Not on file   Social Determinants of Health   Financial Resource Strain: Not on file  Food Insecurity: Not on file  Transportation Needs: Not on file  Physical Activity: Not on file  Stress: Not on file  Social Connections: Not on file  Intimate Partner Violence: Not on file     Review of Systems: All other systems reviewed and are otherwise negative except as noted above.  Physical Exam: There were no vitals filed for this visit.  GEN- The patient is well appearing, alert and oriented x 3 today.   HEENT: normocephalic, atraumatic; sclera clear, conjunctiva pink; hearing intact; oropharynx clear; neck supple, no JVP Lymph- no cervical lymphadenopathy Lungs- Clear to ausculation bilaterally, normal work of breathing.  No wheezes, rales, rhonchi Heart- Regular rate and rhythm, no murmurs, rubs or gallops, PMI not laterally displaced GI- soft, non-tender, non-distended, bowel sounds present, no hepatosplenomegaly Extremities- no clubbing, cyanosis, or edema; DP/PT/radial pulses 2+ bilaterally MS- no significant deformity or atrophy Skin- warm and dry, no rash or lesion Psych- euthymic mood, full affect Neuro- strength and sensation are intact  EKG is ordered. Personal review of EKG from today shows NSR and sinus tachycardia  Additional studies reviewed  include: Previous EP office notes.   Assessment and Plan:  PVCs   Orthostatic tachycardia   Stress   Hypokalemia/Hypomagnasemia  Symptoms worse as of late with no clear aggravating factors. Have occurred with both normo and hypokalemia.    She is intolerant to beta blocker with HA/dizziness, along with rash on each one she has tried.   Similarly intolerant to diltiazem AND verapamil.   Monitor to help clarify what her arrhythmia (if not sinus tach alone) actually is.   Update echo for completeness to see if previously described MVP has worsened, possibly aggravating symptoms.   If SVT identified, see Dr. Ladona Ridgel for ablation consideration per Dr. Graciela Husbands   If sinus tach only, she may benefit from corlanor.   If intolerant or not candidate for corlanor, its possible that more aggressive management of stress/anxiety may also overall help with her symptoms.  Encouraged hydration, salt liberalization, and exercise as tolerated.   Follow up with Dr. Graciela Husbands by phone as scheduled. If surgical candidate keep appointment scheduled with Dr. Ladona Ridgel. If medicines alone or other, that appointment may be cancelled at Dr. Koren Bound appointment.   Graciella Freer, PA-C  09/01/21 11:10 AM

## 2021-09-02 ENCOUNTER — Ambulatory Visit: Payer: BC Managed Care – PPO | Admitting: Student

## 2021-09-02 ENCOUNTER — Other Ambulatory Visit: Payer: Self-pay

## 2021-09-02 ENCOUNTER — Encounter: Payer: Self-pay | Admitting: Student

## 2021-09-02 ENCOUNTER — Ambulatory Visit (INDEPENDENT_AMBULATORY_CARE_PROVIDER_SITE_OTHER): Payer: BC Managed Care – PPO

## 2021-09-02 VITALS — BP 110/70 | HR 96 | Ht 63.0 in | Wt 125.2 lb

## 2021-09-02 DIAGNOSIS — I4729 Other ventricular tachycardia: Secondary | ICD-10-CM

## 2021-09-02 DIAGNOSIS — R002 Palpitations: Secondary | ICD-10-CM

## 2021-09-02 DIAGNOSIS — R000 Tachycardia, unspecified: Secondary | ICD-10-CM | POA: Diagnosis not present

## 2021-09-02 DIAGNOSIS — G909 Disorder of the autonomic nervous system, unspecified: Secondary | ICD-10-CM | POA: Diagnosis not present

## 2021-09-02 DIAGNOSIS — I059 Rheumatic mitral valve disease, unspecified: Secondary | ICD-10-CM

## 2021-09-02 LAB — BASIC METABOLIC PANEL
BUN/Creatinine Ratio: 20 (ref 9–23)
BUN: 20 mg/dL (ref 6–20)
CO2: 24 mmol/L (ref 20–29)
Calcium: 9.5 mg/dL (ref 8.7–10.2)
Chloride: 103 mmol/L (ref 96–106)
Creatinine, Ser: 0.98 mg/dL (ref 0.57–1.00)
Glucose: 97 mg/dL (ref 70–99)
Potassium: 3.7 mmol/L (ref 3.5–5.2)
Sodium: 140 mmol/L (ref 134–144)
eGFR: 76 mL/min/{1.73_m2} (ref >59)

## 2021-09-02 NOTE — Addendum Note (Signed)
Addended by: Carylon Perches on: 09/02/2021 10:49 AM   Modules accepted: Orders

## 2021-09-02 NOTE — Patient Instructions (Signed)
Medication Instructions:  Your physician recommends that you continue on your current medications as directed. Please refer to the Current Medication list given to you today.  *If you need a refill on your cardiac medications before your next appointment, please call your pharmacy*   Lab Work: TODAY: BMET  If you have labs (blood work) drawn today and your tests are completely normal, you will receive your results only by: [place] (if you have MyChart) OR A paper copy in the mail If you have any lab test that is abnormal or we need to change your treatment, we will call you to review the results.   Testing/Procedures: Your physician has requested that you have an echocardiogram. Echocardiography is a painless test that uses sound waves to create images of your heart. It provides your doctor with information about the size and shape of your heart and how well your hearts chambers and valves are working. This procedure takes approximately one hour. There are no restrictions for this procedure.   Follow-Up: At [facility], you and your health needs are our priority.  As part of our continuing mission to provide you with exceptional heart care, we have created designated Provider Care Teams.  These Care Teams include your primary Cardiologist (physician) and Advanced Practice Providers (APPs -  Physician Assistants and Nurse Practitioners) who all work together to provide you with the care you need, when you need it.  We recommend signing up for the patient portal called "MyChart".  Sign up information is provided on this After Visit Summary.  MyChart is used to connect with patients for Virtual Visits (Telemedicine).  Patients are able to view lab/test results, encounter notes, upcoming appointments, etc.  Non-urgent messages can be sent to your provider as well.   To learn more about what you can do with MyChart, go to [email or web address].    Your next appointment:   As  scheduled   Other Instructions ZIO XT- Long Term Monitor Instructions  Your physician has requested you wear a ZIO patch monitor for 14 days.  This is a single patch monitor. [employer] supplies one patch monitor per enrollment. Additional stickers are not available. Please do not apply patch if you will be having a Nuclear Stress Test,  Echocardiogram, Cardiac CT, MRI, or Chest Xray during the period you would be wearing the  monitor. The patch cannot be worn during these tests. You cannot remove and re-apply the  ZIO XT patch monitor.  Your ZIO patch monitor will be mailed 3 day USPS to your address on file. It may take 3-5 days  to receive your monitor after you have been enrolled.  Once you have received your monitor, please review the enclosed instructions. Your monitor  has already been registered assigning a specific monitor serial # to you.  Billing and Patient Assistance Program Information  We have supplied [employer] with any of your insurance information on file for billing purposes. [employer] offers a sliding scale Patient Assistance Program for patients that do not have  insurance, or whose insurance does not completely cover the cost of the ZIO monitor.  You must apply for the Patient Assistance Program to qualify for this discounted rate.  To apply, please call [employer] at [phone], select option 4, select option 2, ask to apply for  Patient Assistance Program. Theodore Demark will ask your household income, and how many people  are in your household. They will quote your out-of-pocket cost based on that information.  [employer] will also be  able to set up a 46-month, interest-free payment plan if needed.  Applying the monitor   Shave hair from upper left chest.  Hold abrader disc by orange tab. Rub abrader in 40 strokes over the upper left chest as  indicated in your monitor instructions.  Clean area with 4 enclosed alcohol pads. Let dry.  Apply patch as indicated in monitor  instructions. Patch will be placed under collarbone on left  side of chest with arrow pointing upward.  Rub patch adhesive wings for 2 minutes. Remove white label marked "1". Remove the white  label marked "2". Rub patch adhesive wings for 2 additional minutes.  While looking in a mirror, press and release button in center of patch. A small green light will  flash 3-4 times. This will be your only indicator that the monitor has been turned on.  Do not shower for the first 24 hours. You may shower after the first 24 hours.  Press the button if you feel a symptom. You will hear a small click. Record Date, Time and  Symptom in the Patient Logbook.  When you are ready to remove the patch, follow instructions on the last 2 pages of Patient  Logbook. Stick patch monitor onto the last page of Patient Logbook.  Place Patient Logbook in the blue and white box. Use locking tab on box and tape box closed  securely. The blue and white box has prepaid postage on it. Please place it in the mailbox as  soon as possible. Your physician should have your test results approximately 7 days after the  monitor has been mailed back to [facility].  Call [place] at [phone] if you have questions regarding  your ZIO XT patch monitor. Call them immediately if you see an orange light blinking on your  monitor.  If your monitor falls off in less than 4 days, contact our Monitor department at [phone].  If your monitor becomes loose or falls off after 4 days call [employer] at [phone] for  suggestions on securing your monitor

## 2021-09-02 NOTE — Progress Notes (Unsigned)
Enrolled for [employer] to mail a ZIO XT long term holter monitor to the patients address on file.  

## 2021-09-04 DIAGNOSIS — I4729 Other ventricular tachycardia: Secondary | ICD-10-CM | POA: Diagnosis not present

## 2021-09-04 DIAGNOSIS — R002 Palpitations: Secondary | ICD-10-CM

## 2021-09-04 DIAGNOSIS — R000 Tachycardia, unspecified: Secondary | ICD-10-CM

## 2021-09-16 ENCOUNTER — Encounter: Payer: Self-pay | Admitting: Family Medicine

## 2021-09-16 MED ORDER — SUMATRIPTAN SUCCINATE 50 MG PO TABS: 100.0000 mg | ORAL_TABLET | ORAL | 12 refills | Status: DC | PRN

## 2021-09-16 MED ORDER — TOPIRAMATE 100 MG PO TABS: 100.0000 mg | ORAL_TABLET | Freq: Two times a day (BID) | ORAL | 3 refills | Status: DC

## 2021-09-22 ENCOUNTER — Other Ambulatory Visit: Payer: Self-pay

## 2021-09-22 ENCOUNTER — Emergency Department (HOSPITAL_COMMUNITY)
Admission: EM | Admit: 2021-09-22 | Discharge: 2021-09-22 | Disposition: A | Discharge status: Home / Self Care | Payer: BC Managed Care – PPO | Attending: Emergency Medicine | Admitting: Emergency Medicine

## 2021-09-22 ENCOUNTER — Encounter: Payer: Self-pay | Admitting: Internal Medicine

## 2021-09-22 ENCOUNTER — Emergency Department (HOSPITAL_COMMUNITY): Payer: BC Managed Care – PPO

## 2021-09-22 ENCOUNTER — Other Ambulatory Visit: Payer: Self-pay | Admitting: Family Medicine

## 2021-09-22 ENCOUNTER — Encounter (HOSPITAL_COMMUNITY): Payer: Self-pay | Admitting: *Deleted

## 2021-09-22 DIAGNOSIS — R002 Palpitations: Secondary | ICD-10-CM | POA: Insufficient documentation

## 2021-09-22 DIAGNOSIS — R0789 Other chest pain: Secondary | ICD-10-CM | POA: Diagnosis not present

## 2021-09-22 DIAGNOSIS — R079 Chest pain, unspecified: Secondary | ICD-10-CM

## 2021-09-22 LAB — TROPONIN I (HIGH SENSITIVITY)
Troponin I (High Sensitivity): <2 ng/L (ref <18)
Troponin I (High Sensitivity): <2 ng/L (ref <18)

## 2021-09-22 LAB — CBC
HCT: 40.1 % (ref 36.0–46.0)
Hemoglobin: 13.2 g/dL (ref 12.0–15.0)
MCH: 31.7 pg (ref 26.0–34.0)
MCHC: 32.9 g/dL (ref 30.0–36.0)
MCV: 96.2 fL (ref 80.0–100.0)
Platelets: 183 10*3/uL (ref 150–400)
RBC: 4.17 MIL/uL (ref 3.87–5.11)
RDW: 12.9 % (ref 11.5–15.5)
WBC: 5.7 10*3/uL (ref 4.0–10.5)
nRBC: 0 % (ref 0.0–0.2)

## 2021-09-22 LAB — BASIC METABOLIC PANEL
Anion gap: 6 (ref 5–15)
BUN: 16 mg/dL (ref 6–20)
CO2: 25 mmol/L (ref 22–32)
Calcium: 8.7 mg/dL — ABNORMAL LOW (ref 8.9–10.3)
Chloride: 107 mmol/L (ref 98–111)
Creatinine, Ser: 0.99 mg/dL (ref 0.44–1.00)
GFR, Estimated: >60 mL/min (ref >60)
Glucose, Bld: 96 mg/dL (ref 70–99)
Potassium: 3.8 mmol/L (ref 3.5–5.1)
Sodium: 138 mmol/L (ref 135–145)

## 2021-09-22 LAB — POC URINE PREG, ED: Preg Test, Ur: NEGATIVE

## 2021-09-22 LAB — HCG, QUANTITATIVE, PREGNANCY: hCG, Beta Chain, Quant, S: <1 m[IU]/mL (ref <5)

## 2021-09-22 LAB — MAGNESIUM: Magnesium: 2.3 mg/dL (ref 1.7–2.4)

## 2021-09-22 MED ORDER — PROCHLORPERAZINE EDISYLATE 10 MG/2ML IJ SOLN
10.0000 mg | Freq: Once | INTRAMUSCULAR | Status: AC
  Administered 2021-09-22: 10 mg via INTRAVENOUS
  Filled 2021-09-22: qty 2

## 2021-09-22 MED ORDER — LACTATED RINGERS IV BOLUS
1000.0000 mL | Freq: Once | INTRAVENOUS | Status: AC
Start: 2021-09-22 — End: 2021-09-22
  Administered 2021-09-22: 1000 mL via INTRAVENOUS

## 2021-09-22 MED ORDER — DIPHENHYDRAMINE HCL 50 MG/ML IJ SOLN
12.5000 mg | Freq: Once | INTRAMUSCULAR | Status: AC
  Administered 2021-09-22: 12.5 mg via INTRAVENOUS
  Filled 2021-09-22: qty 1

## 2021-09-22 MED ORDER — NITROGLYCERIN 2 % TD OINT
1.0000 [in_us] | TOPICAL_OINTMENT | Freq: Once | TRANSDERMAL | Status: AC
Start: 2021-09-22 — End: 2021-09-22
  Administered 2021-09-22: 1 [in_us] via TOPICAL
  Filled 2021-09-22: qty 1

## 2021-09-22 MED ORDER — DIAZEPAM 5 MG/ML IJ SOLN
5.0000 mg | Freq: Once | INTRAMUSCULAR | Status: AC
  Administered 2021-09-22: 5 mg via INTRAVENOUS
  Filled 2021-09-22: qty 2

## 2021-09-22 MED ORDER — IBUPROFEN 400 MG PO TABS
600.0000 mg | ORAL_TABLET | Freq: Once | ORAL | Status: AC
  Administered 2021-09-22: 600 mg via ORAL
  Filled 2021-09-22: qty 2

## 2021-09-22 MED ORDER — FLECAINIDE ACETATE 50 MG PO TABS: 50.0000 mg | ORAL_TABLET | Freq: Two times a day (BID) | ORAL | 3 refills | Status: AC

## 2021-09-22 NOTE — ED Notes (Signed)
BP blood pressure has been low the last two cycles. The last reading was 94/62. MD made aware. ?  ?

## 2021-09-22 NOTE — ED Provider Notes (Signed)
[facility] EMERGENCY DEPARTMENT Provider Note   CSN: [id number] Arrival date & time: 09/22/21  1410     History  Chief Complaint  Patient presents with   Tachycardia    Tara Mahoney is a 36 y.o. female.  HPI Patient presents for tachycardia.  Per chart review, history includes palpitations, mitral valve disorder, PVCs, NSVT, migraines.  She is followed by cardiology and was last seen in clinic 3 weeks ago.  Per cardiology note, she has undergone beta-blockers and calcium channel blockers in the past.  She has been on flecainide for years.  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  She experienced palpitations and shortness of breath that is consistent with her prior episodes of SVT.  Palpitations lasted for 30 to 45 seconds.  Following this, she continued to feel short of breath.  This prompted her to leave the classroom and speak to the colleague.  EMS was called.  Patient reports resolution of shortness of breath.  She has, however, had ongoing sharp chest pain that was present prior to the episode of SVT.  This is a ongoing, intermittent condition that she has had.  This has been mentioned to her cardiologist during her previous office appointment.  She has not identified any pattern to these sharp chest pains.  She has not identified any measures that help resolve it.    Home Medications Prior to Admission medications   Medication Sig Start Date End Date Taking? Authorizing Provider  flecainide (TAMBOCOR) 50 MG tablet Take 1 tablet (50 mg total) by mouth 2 (two) times daily. 09/22/21  Yes Duke Salvia, MD  Magnesium Oxide 400 MG CAPS Take 1 capsule (400 mg total) by mouth daily. 08/21/21  Yes Duke Salvia, MD  Multiple Vitamin (MULTIVITAMIN WITH MINERALS) TABS tablet Take 1 tablet by mouth daily.   Yes [provider]  promethazine (PHENERGAN) 25 MG tablet Take 1 tablet (25 mg total) by mouth every 6 (six) hours as needed for nausea or vomiting.  08/14/20  Yes Rodolph Bong, MD  SUMAtriptan (IMITREX) 50 MG tablet Take 2 tablets (100 mg total) by mouth every 2 (two) hours as needed for migraine. May repeat in 2 hours if headache persists or recurs.  Max dose 200 mg/day 09/16/21  Yes Rodolph Bong, MD  topiramate (TOPAMAX) 100 MG tablet Take 1 tablet (100 mg total) by mouth 2 (two) times daily. 09/16/21  Yes Rodolph Bong, MD  FLUoxetine (PROZAC) 20 MG capsule Take 1 capsule (20 mg total) by mouth daily. 09/23/21   Rodolph Bong, MD      Allergies    Carvedilol, Definity [perflutren lipid microsphere], Hydrocodone-acetaminophen, Morphine and related, Verapamil, Atenolol, Cardizem cd [diltiazem hcl er beads], Metoprolol, and Nebivolol    Review of Systems   Review of Systems  Respiratory:  Positive for shortness of breath.   Cardiovascular:  Positive for chest pain and palpitations.  All other systems reviewed and are negative.  Physical Exam Updated Vital Signs BP 110/67    Pulse 80    Temp 98.7 F (37.1 C)    Resp (!) 21    Ht 5\' 3"  (1.6 m)    Wt 54 kg    LMP 05/04/2011    SpO2 100%    BMI 21.08 kg/m  Physical Exam Vitals and nursing note reviewed.  Constitutional:      General: She is not in acute distress.    Appearance: Normal appearance. She  is well-developed and normal weight. She is not ill-appearing, toxic-appearing or diaphoretic.  HENT:     Head: Normocephalic and atraumatic.     Right Ear: External ear normal.     Left Ear: External ear normal.     Nose: Nose normal.     Mouth/Throat:     Mouth: Mucous membranes are moist.     Pharynx: Oropharynx is clear.  Eyes:     Extraocular Movements: Extraocular movements intact.     Conjunctiva/sclera: Conjunctivae normal.  Cardiovascular:     Rate and Rhythm: Normal rate and regular rhythm.     Heart sounds: No murmur heard. Pulmonary:     Effort: Pulmonary effort is normal. No respiratory distress.     Breath sounds: Normal breath sounds. No wheezing or rales.  Chest:      Chest wall: No tenderness.  Abdominal:     Palpations: Abdomen is soft.     Tenderness: There is no abdominal tenderness.  Musculoskeletal:        General: No swelling. Normal range of motion.     Cervical back: Normal range of motion and neck supple.     Right lower leg: No edema.     Left lower leg: No edema.  Skin:    General: Skin is warm and dry.     Capillary Refill: Capillary refill takes less than 2 seconds.     Coloration: Skin is not jaundiced or pale.  Neurological:     General: No focal deficit present.     Mental Status: She is alert and oriented to person, place, and time.     Cranial Nerves: No cranial nerve deficit.     Sensory: No sensory deficit.     Motor: No weakness.     Coordination: Coordination normal.  Psychiatric:        Mood and Affect: Mood normal.        Behavior: Behavior normal.        Thought Content: Thought content normal.        Judgment: Judgment normal.    ED Results / Procedures / Treatments   Labs (all labs ordered are listed, but only abnormal results are displayed) Labs Reviewed  BASIC METABOLIC PANEL - Abnormal; Notable for the following components:      Result Value   Calcium 8.7 (*)    All other components within normal limits  CBC  MAGNESIUM  HCG, QUANTITATIVE, PREGNANCY  POC URINE PREG, ED  TROPONIN I (HIGH SENSITIVITY)  TROPONIN I (HIGH SENSITIVITY)    EKG EKG Interpretation  Date/Time:  Tuesday September 22 2021 22:58:52 EST Ventricular Rate:  69 PR Interval:  166 QRS Duration: 98 QT Interval:  408 QTC Calculation: 438 R Axis:   -39 Text Interpretation: Sinus rhythm Inferior infarct, old Anterior infarct, old Baseline wander in lead(s) I II aVR Confirmed by Gwyneth SproutPlunkett, Whitney ([id number]) on 09/23/2021 1:30:09 PM  Radiology DG Chest 2 View  Result Date: 09/22/2021 CLINICAL DATA:  Chest pain, shortness of breath EXAM: CHEST - 2 VIEW COMPARISON:  08/29/2021 FINDINGS: The heart size and mediastinal contours are within normal  limits. Both lungs are clear. The visualized skeletal structures are unremarkable. IMPRESSION: No active cardiopulmonary disease. Electronically Signed   By: Duanne GuessNicholas  Plundo D.O.   On: 09/22/2021 15:09    Procedures Procedures    Medications Ordered in ED Medications  ibuprofen (ADVIL) tablet 600 mg (600 mg Oral Given 09/22/21 1604)  nitroGLYCERIN (NITROGLYN) 2 % ointment 1 inch (1 inch  Topical Given 09/22/21 1618)  prochlorperazine (COMPAZINE) injection 10 mg (10 mg Intravenous Given 09/22/21 1753)  diphenhydrAMINE (BENADRYL) injection 12.5 mg (12.5 mg Intravenous Given 09/22/21 1753)  lactated ringers bolus 1,000 mL (0 mLs Intravenous Stopped 09/22/21 2021)  diazepam (VALIUM) injection 5 mg (5 mg Intravenous Given 09/22/21 1826)    ED Course/ Medical Decision Making/ A&P                           Medical Decision Making Amount and/or Complexity of Data Reviewed Labs: ordered. Radiology: ordered.  Risk Prescription drug management.   This patient presents to the ED for concern of palpitations, this involves an extensive number of treatment options, and is a complaint that carries with it a high risk of complications and morbidity.  The differential diagnosis includes SVT, other cardiac arrhythmia, anxiety   Co morbidities that complicate the patient evaluation  palpitations, mitral valve disorder, PVCs, NSVT, migraines   Additional history obtained:  Additional history obtained from N/A External records from outside source obtained and reviewed including EMR   Lab Tests:  I Ordered, and personally interpreted labs.  The pertinent results include:  Normal electrolytes, normal troponin, no leukocytosis, normal hemoglobin   Imaging Studies ordered:  I ordered imaging studies including chest x-ray I independently visualized and interpreted imaging which showed no acute findings I agree with the radiologist interpretation   Cardiac Monitoring:  The patient was maintained on a  cardiac monitor.  I personally viewed and interpreted the cardiac monitored which showed an underlying rhythm of: Sinus rhythm   Medicines ordered and prescription drug management:  I ordered medication including ibuprofen and nitroglycerin ointment for chest pain Reevaluation of the patient after these medicines showed that the patient improved I have reviewed the patients home medicines and have made adjustments as needed   Problem List / ED Course:  Healthy 36 year old female with history of intermittent episodes of sharp chest pain, as well as history of intermittent episodes of palpitations, presents for episode of palpitations and shortness of breath that occurred today at approximately 1 PM.  At that time, she was not exerting herself.  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  Palpitations lasted for 30 to 45 seconds.  She had associated shortness of breath and chest pain that is consistent with her prior episodes of chest pains.  On arrival in the ED, patient has a normal heart rate.  On EKG, she has a normal sinus rhythm and this is also identified on cardiac monitor.  Patient was kept on cardiac monitor and lab work was obtained.  Lab work showed normal electrolytes and normal troponin.  Patient was given ibuprofen for symptomatic relief of chest pain.  Symptoms could be related to coronary vasospasms and so nitroglycerin ointment was also trialed.  On reassessment, she did report resolved chest pain, however, at this point she had a 10/10 severity headache.  She does have a history of migraines.  Migraine cocktail was given.  Following migraine cocktail, she had resolution of headache but now developed diffuse tremors.  Valium was given which did resolve her tremors.  Following that, she endorsed continued resolution of her chest pain and headache.  At this point, she simply felt sleepy.  Patient remained under ED observation and did not have any new or worsening symptoms.   There was nursing documentation of low blood pressures.  When assessed personally, patient has low-normal blood pressures which  is her baseline.  She is followed by cardiology and did recently wear a Zio patch.  She also has an outpatient echocardiogram scheduled.  She does feel comfortable with discharge at this time.  She was advised to continue to follow-up with cardiology as scheduled and to return to the ED if she does have any worsening symptoms.    Social Determinants of Health:  Lives independently           Final Clinical Impression(s) / ED Diagnoses Final diagnoses:  Palpitations  Chest pain, unspecified type    Rx / DC Orders ED Discharge Orders     None         Gloris Manchester, MD 09/23/21 1341

## 2021-09-22 NOTE — ED Triage Notes (Signed)
Brought in via EMS for SVT ?

## 2021-09-22 NOTE — Telephone Encounter (Signed)
Pt's medication was sent to pt's pharmacy as requested. Confirmation received.  °

## 2021-09-22 NOTE — ED Notes (Signed)
Pt stated unable to urinate at this time 

## 2021-09-23 MED ORDER — FLUOXETINE HCL 20 MG PO CAPS: 20.0000 mg | ORAL_CAPSULE | Freq: Every day | ORAL | 1 refills | Status: DC

## 2021-09-24 ENCOUNTER — Encounter: Payer: Self-pay | Admitting: Internal Medicine

## 2021-09-24 ENCOUNTER — Ambulatory Visit (INDEPENDENT_AMBULATORY_CARE_PROVIDER_SITE_OTHER): Payer: BC Managed Care – PPO | Admitting: Internal Medicine

## 2021-09-24 VITALS — BP 126/74 | HR 98 | Ht 63.0 in | Wt 119.0 lb

## 2021-09-24 DIAGNOSIS — R002 Palpitations: Secondary | ICD-10-CM

## 2021-09-24 DIAGNOSIS — I4729 Other ventricular tachycardia: Secondary | ICD-10-CM | POA: Diagnosis not present

## 2021-09-24 DIAGNOSIS — G909 Disorder of the autonomic nervous system, unspecified: Secondary | ICD-10-CM | POA: Diagnosis not present

## 2021-09-24 NOTE — Progress Notes (Signed)
?  ? ?Electrophysiology TeleHealth Note ? ? ?Due to national recommendations of social distancing due to COVID 19, an audio/video telehealth visit is felt to be most appropriate for this patient at this time.  See MyChart message from today for the patient's consent to telehealth for [facility]. ? ? ?Date:  09/24/2021  ? ?ID:  Tara Mahoney, DOB [birth year], MRN [id number]  Location: patient's home ? ?Provider location: [address] [place] ? ?Evaluation Performed: Follow-up visit ? ?PCP:  Tara Mahoney, [facility]  ?Cardiologist:     ?Electrophysiologist:  Tara Mahoney  ? ?Chief Complaint: Chest pain and PVCs ? ?History of Present Illness:   ? ?Tara Mahoney is a 36 y.o. female who presents via audio/video conferencing for a telehealth visit today.  Since last being seen in our clinic for palpitations presumably associated with PVCs initially but then no longer responsive to flecainide in the setting of mildly abnormal cMRI (LVEF 40% with diffuse hypokinesis in the predominantly apical areas, and an increased ratio of noncompaction to compacted myocardium) and having in the interval seen [facility] who ordered an echo and an event recorder neither which is complete, the patient states has continued to have episodes of chest pain.  She describes 2 pains, 1 a pressure 1 stabbing.  They can be associated with each other sometimes occurring separately.  She also has palpitations that can occur with either.  The palpitations that she feels are the same with both typically very brief lasting seconds.    ? ?Chset Pressure, hot and dyspnea, duration lasts fraction of minutes up to 5 minutes--  occurs while seated or standing or with exertion; new over 4 days ?Stabbing pain >> marked on the monitor ?Both can be assoc with palpitations  ?Troponin are all normal  ?Monitor  ?? Mitral valve with echo pending  ? ? ? ?The patient denies symptoms of fevers, chills, cough, or new SOB worrisome for COVID 19.  Already ? ?Past Medical  History:  ?Diagnosis Date  ? Blood transfusion without reported diagnosis   ? Complication of anesthesia   ? Dysautonomia (HCC)   ? sinus arrythmia  ? Family history of anesthesia complication   ? PONV  ? Headache(784.0)   ? Heart murmur   ? MVP (mitral valve prolapse)   ? PONV (postoperative nausea and vomiting)   ? Ventricular tachycardia, nonsustained   ? ? ?Past Surgical History:  ?Procedure Laterality Date  ? ABDOMINAL HYSTERECTOMY    ? CERVICAL CERCLAGE  2008  ? CERVICAL CERCLAGE  09/08/2011  ? Procedure: CERCLAGE CERVICAL;  Surgeon: Tara Arms, MD;  Location: [facility];  Service: Gynecology;  Laterality: N/A;  Tara Mahoney,CST-scrubbed; doctor scrubbed in @ [phone]  ? LAPAROSCOPIC LYSIS OF ADHESIONS  08/09/2012  ? Procedure: LAPAROSCOPIC LYSIS OF ADHESIONS;  Surgeon: Tara Arms, MD;  Location: [facility];  Service: Gynecology;  Laterality: N/A;  ? LAPAROSCOPY  08/09/2012  ? Procedure: LAPAROSCOPY DIAGNOSTIC;  Surgeon: Tara Arms, MD;  Location: [facility];  Service: Gynecology;  Laterality: N/A;  started at  1208-Vaginal trachelectomy (vaginal removal of cervical stump) ?  ? MYRINGOTOMY    ? SUPRACERVICAL ABDOMINAL HYSTERECTOMY  01/26/2012  ? Procedure: HYSTERECTOMY SUPRACERVICAL ABDOMINAL;  Surgeon: Tara Arms, MD;  Location: [facility];  Service: Gynecology;;  converted [phone]  ? ? ?Current Outpatient Medications  ?Medication Sig Dispense Refill  ? flecainide (TAMBOCOR) 50 MG tablet Take 1 tablet (50 mg total) by mouth 2 (two) times daily. 180 tablet  3  ? FLUoxetine (PROZAC) 20 MG capsule Take 1 capsule (20 mg total) by mouth daily. 90 capsule 1  ? Magnesium Oxide 400 MG CAPS Take 1 capsule (400 mg total) by mouth daily. 90 capsule 0  ? Multiple Vitamin (MULTIVITAMIN WITH MINERALS) TABS tablet Take 1 tablet by mouth daily.    ? promethazine (PHENERGAN) 25 MG tablet Take 1 tablet (25 mg total) by mouth every 6 (six) hours as needed for nausea or vomiting. 30 tablet 2  ? SUMAtriptan (IMITREX) 50 MG tablet Take 2  tablets (100 mg total) by mouth every 2 (two) hours as needed for migraine. May repeat in 2 hours if headache persists or recurs.  Max dose 200 mg/day 9 tablet 12  ? topiramate (TOPAMAX) 100 MG tablet Take 1 tablet (100 mg total) by mouth 2 (two) times daily. 60 tablet 3  ? ?No current facility-administered medications for this visit.  ? ? ?Allergies:   Carvedilol, Definity [perflutren lipid microsphere], Hydrocodone-acetaminophen, Morphine and related, Verapamil, Atenolol, Cardizem cd [diltiazem hcl er beads], Metoprolol, and Nebivolol  ? ?Social History:  The patient  reports that she quit smoking about 12 years ago. Her smoking use included cigarettes. She started smoking about 19 years ago. She has never been exposed to tobacco smoke. She has never used smokeless tobacco. She reports that she does not drink alcohol and does not use drugs.  ? ?Family History:  The patient's   family history includes Heart disease in her brother; Hypertension in her brother; Ovarian cancer in her mother.  ? ?ROS:  Please see the history of present illness.   All other systems are personally reviewed and negative.  ? ? ?Exam:   ? ?Vital Signs:  BP 126/74   Pulse 98   Ht 5\' 3"  (1.6 m)   Wt 119 lb (54 kg)   LMP 05/04/2011   BMI 21.08 kg/m?     ? ?Tara Mahoney Limited due to get ? ?Labs/Other Tests and Data Reviewed:   ? ?Recent Labs: ?08/29/2021: TSH 2.574 ?09/22/2021: BUN 16; Creatinine, Ser 0.99; Hemoglobin 13.2; Magnesium 2.3; Platelets 183; Potassium 3.8; Sodium 138  ? ?Wt Readings from Last 3 Encounters:  ?09/24/21 119 lb (54 kg)  ?09/22/21 119 lb (54 kg)  ?09/02/21 125 lb 3.2 oz (56.8 kg)  ?  ?  ? ? ? ?ASSESSMENT & PLAN:   ? ?Palpitations ? 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 ?Chest pain syndrome   ? ?The patient continues to have chest pain.  Negative troponins.  Had in the past mitral regurgitation that was eccentric.  Needs reevaluation.  Echo pending we will need TEE.  Also had a mild cardiomyopathy and question of noncompaction.  May  well need repeat cMRI.  In the interim, I have suggested that she try Tylenol for her chest pain and famotidine in the event that reflux is part of her symptom complex ? ? ? ?COVID 19 screen ?The patient denies symptoms of COVID 19 at this time.  The importance of social distancing was discussed today. ? ?Follow-up:    3 weeks telehealth. ?  ? ?Current medicines are reviewed at length with the patient today.   ?The patient  concerns regarding her medicines.  The following changes were made today:   ? ?Labs/ tests ordered today include:  ?No orders of the defined types were placed in this encounter. ? ? ?Future tests ( post COVID ) pending but not consummated months ? ?Patient Risk:  after full review of this patients clinical  status, I feel that they are at moderate risk at this time. ? ?Today, I have spent 21 minutes with the patient with telehealth technology discussing the above. ? ?Signed, ?Sherryl Manges, MD  ?09/24/2021 5:23 PM    ? ?[facility] ?[address] ?[address] ?[place] [address] ?[phone] (office) ?[phone] (fax) ? ?

## 2021-09-24 NOTE — Patient Instructions (Signed)
Medication Instructions:  ?Your physician recommends that you continue on your current medications as directed. Please refer to the Current Medication list given to you today.  ? ?** May try OTC Pepcid ? ?*If you need a refill on your cardiac medications before your next appointment, please call your pharmacy* ? ? ?Lab Work: ?None ordered. ? ?If you have labs (blood work) drawn today and your tests are completely normal, you will receive your results only by: ?MyChart Message (if you have MyChart) OR ?A paper copy in the mail ?If you have any lab test that is abnormal or we need to change your treatment, we will call you to review the results. ? ? ?Testing/Procedures: ?None ordered. ? ? ? ?Follow-Up: ?At [facility], you and your health needs are our priority.  As part of our continuing mission to provide you with exceptional heart care, we have created designated Provider Care Teams.  These Care Teams include your primary Cardiologist (physician) and Advanced Practice Providers (APPs -  Physician Assistants and Nurse Practitioners) who all work together to provide you with the care you need, when you need it. ? ?We recommend signing up for the patient portal called "MyChart".  Sign up information is provided on this After Visit Summary.  MyChart is used to connect with patients for Virtual Visits (Telemedicine).  Patients are able to view lab/test results, encounter notes, upcoming appointments, etc.  Non-urgent messages can be sent to your provider as well.   ?To learn more about what you can do with MyChart, go to [email or web address].   ? ?Your next appointment:   ?Tele-health visit 3-4 weeks with Dr Graciela Husbands ?

## 2021-10-05 ENCOUNTER — Ambulatory Visit (HOSPITAL_COMMUNITY): Payer: BC Managed Care – PPO | Attending: Cardiology

## 2021-10-05 ENCOUNTER — Other Ambulatory Visit: Payer: Self-pay

## 2021-10-05 DIAGNOSIS — R002 Palpitations: Secondary | ICD-10-CM | POA: Diagnosis present

## 2021-10-05 DIAGNOSIS — I4729 Other ventricular tachycardia: Secondary | ICD-10-CM | POA: Insufficient documentation

## 2021-10-05 DIAGNOSIS — I059 Rheumatic mitral valve disease, unspecified: Secondary | ICD-10-CM | POA: Diagnosis present

## 2021-10-05 DIAGNOSIS — I340 Nonrheumatic mitral (valve) insufficiency: Secondary | ICD-10-CM

## 2021-10-05 LAB — ECHOCARDIOGRAM COMPLETE
Area-P 1/2: 5.13 cm2
MV M vel: 5.67 m/s
MV Peak grad: 128.4 mmHg
Radius: 0.6 cm
S' Lateral: 2.7 cm

## 2021-10-07 ENCOUNTER — Telehealth: Payer: Self-pay

## 2021-10-07 NOTE — Telephone Encounter (Signed)
Spoke with pt and advised of echo result per Otilio Saber, PA_C and monitor result reviewed by Dr Ladona Ridgel. Will have Dr Odessa Fleming scheduler contact pt to schedule tele-health visit.  Pt verbalizes understanding and thanked Charity fundraiser for the call. ?

## 2021-10-07 NOTE — Telephone Encounter (Signed)
-----   Message from Graciella Freer, PA-C sent at 10/07/2021  3:19 PM EDT ----- ? I reviewed this patients monitor with Dr. Ladona Ridgel and he says it all looks like sinus tach and he would not offer her ablation. Please move from [facility] schedule 3/24;   ? ?Needs to follow up with Dr. Graciela Husbands (tele visit OK) to discuss monitor and echo.  ? ?Doreatha Martin, PA-C  ?10/07/2021 3:21 PM ?  ? ?

## 2021-10-09 ENCOUNTER — Ambulatory Visit: Payer: BC Managed Care – PPO | Admitting: Internal Medicine

## 2021-10-14 ENCOUNTER — Ambulatory Visit: Payer: BC Managed Care – PPO | Admitting: Internal Medicine

## 2021-10-14 ENCOUNTER — Other Ambulatory Visit: Payer: Self-pay | Admitting: Family Medicine

## 2021-10-14 ENCOUNTER — Encounter: Payer: Self-pay | Admitting: Internal Medicine

## 2021-10-14 ENCOUNTER — Other Ambulatory Visit: Payer: Self-pay

## 2021-10-14 VITALS — BP 108/76 | HR 88 | Ht 63.0 in | Wt 127.6 lb

## 2021-10-14 DIAGNOSIS — G909 Disorder of the autonomic nervous system, unspecified: Secondary | ICD-10-CM | POA: Diagnosis not present

## 2021-10-14 DIAGNOSIS — R002 Palpitations: Secondary | ICD-10-CM | POA: Diagnosis not present

## 2021-10-14 MED ORDER — CLONIDINE 0.1 MG/24HR TD PTWK: 0.1000 mg | MEDICATED_PATCH | TRANSDERMAL | 12 refills | Status: DC

## 2021-10-14 NOTE — Patient Instructions (Addendum)
Medication Instructions:  ?Your physician has recommended you make the following change in your medication:  ? ? Start using clonidine patch 0.1 mg-  You will place one patch transdermally.  It should be changed weekly. ? ?Labwork: ?None ordered. ? ?Testing/Procedures: ?None ordered. ? ?Follow-Up: ?Your physician wants you to follow-up in: 4-6 weeks with Dr. Graciela Husbands  ? ?Nov 18, 2021 at 11:45 am ? ?Any Other Special Instructions Will Be Listed Below (If Applicable). ? ?If you need a refill on your cardiac medications before your next appointment, please call your pharmacy.  ? ?Clonidine Patches ?What is this medication? ?CLONIDINE (KLOE ni deen) treats high blood pressure. It works by relaxing blood vessels, which decreases blood pressure and the amount of work the heart has to do. ?This medicine may be used for other purposes; ask your health care provider or pharmacist if you have questions. ?COMMON BRAND NAME(S): Catapres-TTS ?What should I tell my care team before I take this medication? ?They need to know if you have any of these conditions: ?Kidney disease ?An unusual or allergic reaction to clonidine, other medications, foods, dyes, or preservatives ?Pregnant or trying to get pregnant ?Breast-feeding ?How should I use this medication? ?This medication is for external use only. Use it as directed on the prescription label. Apply the patch to an area of the upper arm or part of the body that is clean, dry, and hairless. Avoid injured, irritated, calloused, or scarred areas. Use a different site each time to prevent skin irritation. Do not cut or trim the patch. One patch should last for 7 days. Keep using it unless your care team tells you to stop. ?This medication comes with INSTRUCTIONS FOR USE. Ask your pharmacist for directions on how to use this medication. Read the information carefully. Talk to your pharmacist or care team if you have questions. ?Talk to your care team about the use of this medication in  children. Special care may be needed. ?Overdosage: If you think you have taken too much of this medicine contact a poison control center or emergency room at once. ?NOTE: This medicine is only for you. Do not share this medicine with others. ?What if I miss a dose? ?Replace each patch on the same day of each week, or if the patch falls off. If you do forget to change the patch for two or three days, check with your care team. ?What may interact with this medication? ?Do not take this medication with any of the following: ?MAOIs like Carbex, Eldepryl, Marplan, Nardil, and Parnate ?This medication may also interact with the following: ?Barbiturate medications for inducing sleep or treating seizures like phenobarbital ?Certain medications for blood pressure, heart disease, irregular heart beat ?Certain medications for depression, anxiety, or psychotic disturbances ?Prescription pain medications ?This list may not describe all possible interactions. Give your health care provider a list of all the medicines, herbs, non-prescription drugs, or dietary supplements you use. Also tell them if you smoke, drink alcohol, or use illegal drugs. Some items may interact with your medicine. ?What should I watch for while using this medication? ?Visit your care team for regular checks on your progress. Check your heart rate and blood pressure regularly while you are using this medication. Ask your care team what your heart rate should be and when you should contact him or her. ?You can shower or bathe with the skin patch in position. If the patch gets loose, cover it with the extra adhesive overlay provided. ?You may get drowsy  or dizzy. Do not drive, use machinery, or do anything that needs mental alertness until you know how this medication affects you. To avoid dizzy or fainting spells, do not stand or sit up quickly, especially if you are an older person. Alcohol can make you more drowsy and dizzy. Avoid alcoholic drinks. ?Your  mouth may get dry. Chewing sugarless gum or sucking hard candy, and drinking plenty of water will help. ?Do not treat yourself for coughs, colds, or pain while you are using this medication without asking your care team for advice. Some ingredients may increase your blood pressure. ?If you are going to have surgery tell your care team that you are using this medication. ?If you are going to have a magnetic resonance imaging (MRI) procedure, tell your MRI technician if you have this patch on your body. It must be removed before a MRI. ?What side effects may I notice from receiving this medication? ?Side effects that you should report to your care team as soon as possible: ?Allergic reactions--skin rash, itching, hives, swelling of the face, lips, tongue, or throat ?Low blood pressure--dizziness, feeling faint or lightheaded, blurry vision ?Slow heartbeat--dizziness, feeling faint or lightheaded, confusion, trouble breathing, unusual weakness or fatigue ?Side effects that usually do not require medical attention (report to your care team if they continue or are bothersome): ?Constipation ?Dizziness ?Drowsiness ?Dry eyes ?Dry mouth ?Fatigue ?This list may not describe all possible side effects. Call your doctor for medical advice about side effects. You may report side effects to FDA at 1-800-FDA-1088. ?Where should I keep my medication? ?Keep out of the reach of children and pets. ?Store at room temperature between 20 and 25 degrees C (68 to 77 degrees F). Throw away any unused medication after the expiration date. ?NOTE: This sheet is a summary. It may not cover all possible information. If you have questions about this medicine, talk to your doctor, pharmacist, or health care provider. ?? 2022 Elsevier/Gold Standard (2020-08-20 00:00:00) ? ? ?

## 2021-10-14 NOTE — Progress Notes (Signed)
? ? ? ? ?Patient Care Team: ?[facility], [facility] - [facility] ?Tara Mahoney, Tara Ziemann C, MD as PCP - Electrophysiology (Cardiology) ?Tara PocheBranch, Tara F, MD as Consulting Physician (Cardiology) ?Tara Mahoney, Tara W, MD as Consulting Physician (Cardiology) ? ? ?HPI ? ?Tara Mahoney is a 36 y.o. 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  Flecainide had been used with only modest benefit  ? ?More recently she been having problems atypical chest pain which I thought initially were related to PVCs; she then had more issues with chest pain prompting an echo and an event recorder per [facility] ? ?History of mitral regurgitation previously mild-moderate but noted to be eccentric, ? ?Also symptoms of orthostatic shower and heat intolerance and exercise associated palpitations for which she previously been treated with salt and fluids with some improvement. ? ?Event Recorder  reviewed   ?Patient triggered events appear to be mostly sinus rhythm with some variation in heart rate ? ?Interim events however were mostly not heart rhythm related.  Rather, it was sharp lancinating pains, these typically last 2-3 seconds almost always occurring behind her left breast. ? ?She is also troubled by tachypalpitations.  These have persisted despite efforts to replete with water and salt ? ? ?DATE TEST EF    ?8/17 cMRI 48 % ?  Noncompaction  ?9/17 Echo  50-55 %    ?3/18 Echo  55 %    ? 3/23 Echo   75% MR-moderate-eccentric  ?  ?Date Cr K Hgb  ?1/23 0.82 3.3 14.1  ?         ?  ?   ? ?Records and Results Reviewed  ? ?Losing weight since the beginning of lost about 10 pounds which NOAC ? ?Past Medical History:  ?Diagnosis Date  ? Blood transfusion without reported diagnosis   ? Complication of anesthesia   ? Dysautonomia (HCC)   ? sinus arrythmia  ? Family history of anesthesia complication   ? PONV  ?  Headache(784.0)   ? Heart murmur   ? MVP (mitral valve prolapse)   ? PONV (postoperative nausea and vomiting)   ? Ventricular tachycardia, nonsustained   ? ? ?Past Surgical History:  ?Procedure Laterality Date  ? ABDOMINAL HYSTERECTOMY    ? CERVICAL CERCLAGE  2008  ? CERVICAL CERCLAGE  09/08/2011  ? Procedure: CERCLAGE CERVICAL;  Surgeon: Tara ArmsLuther H Eure, MD;  Location: [facility];  Service: Gynecology;  Laterality: N/A;  Tara Mahoney,CST-scrubbed; doctor scrubbed in @ [phone]  ? LAPAROSCOPIC LYSIS OF ADHESIONS  08/09/2012  ? Procedure: LAPAROSCOPIC LYSIS OF ADHESIONS;  Surgeon: Tara ArmsLuther H Eure, MD;  Location: [facility];  Service: Gynecology;  Laterality: N/A;  ? LAPAROSCOPY  08/09/2012  ? Procedure: LAPAROSCOPY DIAGNOSTIC;  Surgeon: Tara ArmsLuther H Eure, MD;  Location: [facility];  Service: Gynecology;  Laterality: N/A;  started at  1208-Vaginal trachelectomy (vaginal removal of cervical stump) ?  ? MYRINGOTOMY    ? SUPRACERVICAL ABDOMINAL HYSTERECTOMY  01/26/2012  ? Procedure: HYSTERECTOMY SUPRACERVICAL ABDOMINAL;  Surgeon: Tara ArmsLuther H Eure, MD;  Location: [facility];  Service: Gynecology;;  converted [phone]  ? ? ?Current Meds  ?Medication Sig  ? cloNIDine (CATAPRES - DOSED IN MG/24 HR) 0.1 mg/24hr patch Place 1 patch (0.1 mg total) onto the skin once a week.  ? flecainide (TAMBOCOR) 50 MG tablet Take 1 tablet (50 mg total) by mouth 2 (two) times daily.  ?  FLUoxetine (PROZAC) 20 MG capsule Take 1 capsule (20 mg total) by mouth daily.  ? Magnesium Oxide 400 MG CAPS Take 1 capsule (400 mg total) by mouth daily.  ? Multiple Vitamin (MULTIVITAMIN WITH MINERALS) TABS tablet Take 1 tablet by mouth daily.  ? promethazine (PHENERGAN) 25 MG tablet Take 1 tablet (25 mg total) by mouth every 6 (six) hours as needed for nausea or vomiting.  ? simvastatin (ZOCOR) 10 MG tablet Take 10 mg by mouth at bedtime.  ? SUMAtriptan (IMITREX) 50 MG tablet Take 2 tablets (100 mg total) by mouth every 2 (two) hours as needed for migraine. May repeat in 2 hours if headache  persists or recurs.  Max dose 200 mg/day  ? topiramate (TOPAMAX) 100 MG tablet Take 1 tablet (100 mg total) by mouth 2 (two) times daily.  ? ? ?Allergies  ?Allergen Reactions  ? Carvedilol Hives and Itching  ? Definity [Perflutren Lipid Microsphere] Other (See Comments)  ?  Headache  ? Hydrocodone-Acetaminophen Itching and Nausea And Vomiting  ? Morphine And Related Nausea And Vomiting  ? Verapamil   ?  Racing heart, dizzyness, headache,shaking all over her body   ? Atenolol Rash and Other (See Comments)  ? Cardizem Cd [Diltiazem Hcl Er Beads] Rash  ? Metoprolol Rash  ?  Headaches, dizzyness  ? Nebivolol Rash  ? ? ? ? ?Review of Systems negative except from HPI and PMH ? ?Physical Exam ?BP 108/76 (BP Location: Left Arm, Patient Position: Sitting, Cuff Size: Normal)   Pulse 88   Ht 5\' 3"  (1.6 m)   Wt 127 lb 9.6 oz (57.9 kg)   LMP 05/04/2011   SpO2 99%   BMI 22.60 kg/m?  ?Well developed and well nourished in no acute distress ?HENT normal ?E scleral and icterus clear ?Neck Supple ?JVP flat; carotids brisk and full ?Clear to ausculation ?Regular rate and rhythm, no murmurs gallops or rub ?Soft with active bowel sounds ?No clubbing cyanosis  Edema ?Alert and oriented, grossly normal motor and sensory function ?Skin Warm and Dry ? ?ECG   ?Event recorder reviewed with the patient sinus rhythm with intermittent tachycardia. ? ?CrCl cannot be calculated (Patient's most recent lab result is older than the maximum 21 days allowed.). ? ? ?Assessment and  Plan ?Palpitations ?  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  ?Chest pain syndrome   ? ?Beta-blocker allergies with hives ? ?Cardiomyopathy-mild-resolved ? ?There are 3 separate issues.  The first is the pain.  As lancinating, I do not think it is related to her cardiac conditions.  We discussed trying an NSAID we will start there with ibuprofen 400 twice daily.  We also reviewed on up-to-date and there is some data for the use of amitriptyline for such kind of pain.  We will do that  secondarily. ? ?With her tachy palpitations and sinus tachycardia, I do not know that she would be a candidate for the protocol at [facility] but I will reach out to [facility] and ask.  She has been intolerant of beta-blockers.  We will try clonidine as a central sympatholytic, TTS-1 to see if we can help attenuate her tachycardia.  We might also be able to try ivabradine. ? ?I will reach out to colleagues for help with evaluation and long-term follow-up of her mitral regurgitation.  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 ? ?Current medicines are reviewed at length with the patient today .  The patient  does not  have concerns regarding medicines.  ?

## 2021-11-08 DIAGNOSIS — I639 Cerebral infarction, unspecified: Secondary | ICD-10-CM

## 2021-11-08 HISTORY — DX: Cerebral infarction, unspecified: I63.9

## 2021-11-18 ENCOUNTER — Ambulatory Visit: Payer: BC Managed Care – PPO | Admitting: Internal Medicine

## 2021-12-17 ENCOUNTER — Encounter: Payer: Self-pay | Admitting: Internal Medicine

## 2021-12-17 ENCOUNTER — Ambulatory Visit: Payer: BC Managed Care – PPO | Admitting: Internal Medicine

## 2021-12-17 VITALS — BP 100/80 | HR 68 | Ht 63.0 in | Wt 128.0 lb

## 2021-12-17 DIAGNOSIS — I059 Rheumatic mitral valve disease, unspecified: Secondary | ICD-10-CM

## 2021-12-17 DIAGNOSIS — R002 Palpitations: Secondary | ICD-10-CM | POA: Diagnosis not present

## 2021-12-17 DIAGNOSIS — G909 Disorder of the autonomic nervous system, unspecified: Secondary | ICD-10-CM

## 2021-12-17 NOTE — Patient Instructions (Addendum)
Medication Instructions:  Your physician recommends that you continue on your current medications as directed. Please refer to the Current Medication list given to you today.  *If you need a refill on your cardiac medications before your next appointment, please call your pharmacy*   Lab Work: None ordered.  If you have labs (blood work) drawn today and your tests are completely normal, you will receive your results only by: MyChart Message (if you have MyChart) OR A paper copy in the mail If you have any lab test that is abnormal or we need to change your treatment, we will call you to review the results.   Testing/Procedures: Dr Graciela Husbands would like for you to have an echo in 6 months prior to your next appointment with him.  Your physician has requested that you have an echocardiogram. Echocardiography is a painless test that uses sound waves to create images of your heart. It provides your doctor with information about the size and shape of your heart and how well your heart's chambers and valves are working. This procedure takes approximately one hour. There are no restrictions for this procedure.     Follow-Up: At [facility], you and your health needs are our priority.  As part of our continuing mission to provide you with exceptional heart care, we have created designated Provider Care Teams.  These Care Teams include your primary Cardiologist (physician) and Advanced Practice Providers (APPs -  Physician Assistants and Nurse Practitioners) who all work together to provide you with the care you need, when you need it.  We recommend signing up for the patient portal called "MyChart".  Sign up information is provided on this After Visit Summary.  MyChart is used to connect with patients for Virtual Visits (Telemedicine).  Patients are able to view lab/test results, encounter notes, upcoming appointments, etc.  Non-urgent messages can be sent to your provider as well.   To learn more  about what you can do with MyChart, go to [email or web address].    Your next appointment:    6 months with Dr Graciela Husbands  Discussed salt substitutes with a  goal of 2-3 gms of Sodium or 5-7 gm of sodium Chloride daily.  Rehydration solutions include Liquid IV, NUUN, TriOral, Normralyte pedialyte advanced care and Banana Bags.  Salt tablets include plain salt tablets, SaltStick Vitassium, thermatabs amongst others.   The higher sodium concentration per serving on this list is normalyte about 800 mg of sodium per serving and LMNT1000 mg    Salt supplements are best used with adjunctive sugar    Important Information About Sugar

## 2021-12-17 NOTE — Progress Notes (Signed)
Patient Care Team: Pllc, [facility] as PCP - General Deboraha Sprang, MD as PCP - Electrophysiology (Cardiology) Harl Bowie Alphonse Guild, MD as Consulting Physician (Cardiology) Evans Lance, MD as Consulting Physician (Cardiology)   HPI  Tara Mahoney is a 36 y.o. 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  Reviewed salt intake only about 200 mg/day  Event Recorder  reviewed   Patient triggered events appear to be mostly sinus rhythm with some variation in heart rate         DATE TEST EF    8/17 cMRI 48 % ?  Noncompaction  9/17 Echo  50-55 %    3/18 Echo  55 %     3/23 Echo   75% MR-moderate-eccentric    Date Cr K Hgb  1/23 0.82 3.3 14.1                 Records and Results Reviewed   Losing weight since the beginning of lost about 10 pounds which NOAC  Past Medical History:  Diagnosis Date   Blood transfusion without reported diagnosis    Complication of anesthesia    Dysautonomia ([place])    sinus arrythmia   Family history of anesthesia complication    PONV   [id number])    Heart murmur    MVP (mitral valve prolapse)    PONV (postoperative nausea and vomiting)    Ventricular tachycardia, nonsustained [facility])     Past Surgical History:  Procedure Laterality Date   ABDOMINAL HYSTERECTOMY     CERVICAL CERCLAGE  2008   CERVICAL CERCLAGE  09/08/2011   Procedure: CERCLAGE CERVICAL;  Surgeon: Florian Buff, MD;  Location: [facility];  Service: Gynecology;  Laterality: N/A;  [name],CST-scrubbed; doctor scrubbed in @ [place]  08/09/2012   Procedure: LAPAROSCOPIC LYSIS OF ADHESIONS;  Surgeon: Florian Buff, MD;  Location: [facility];  Service: Gynecology;  Laterality: N/A;   LAPAROSCOPY  08/09/2012   Procedure: LAPAROSCOPY DIAGNOSTIC;  Surgeon: Florian Buff, MD;  Location: [facility];  Service: Gynecology;  Laterality: N/A;  started at  1208-Vaginal trachelectomy (vaginal removal of cervical stump)    MYRINGOTOMY     SUPRACERVICAL ABDOMINAL HYSTERECTOMY  01/26/2012   Procedure: HYSTERECTOMY SUPRACERVICAL ABDOMINAL;  Surgeon: Florian Buff, MD;  Location: [facility];  Service: Gynecology;;  converted [phone]    Current Meds  Medication Sig   cloNIDine (CATAPRES - DOSED IN MG/24 HR) 0.1 mg/24hr patch Place 1 patch (0.1 mg total) onto the skin once a week.   flecainide (TAMBOCOR) 50 MG tablet Take 1 tablet (50 mg total) by mouth 2 (two) times daily.   FLUoxetine (PROZAC) 20 MG capsule TAKE 1 CAPSULE BY MOUTH EVERY DAY   Magnesium Oxide 400 MG CAPS Take 1 capsule (400 mg total) by mouth daily.   Multiple Vitamin (MULTIVITAMIN WITH MINERALS) TABS tablet Take 1 tablet by mouth daily.   promethazine (PHENERGAN) 25 MG tablet Take  1 tablet (25 mg total) by mouth every 6 (six) hours as needed for nausea or vomiting.   simvastatin (ZOCOR) 10 MG tablet Take 10 mg by mouth at bedtime.   SUMAtriptan (IMITREX) 50 MG tablet Take 2 tablets (100 mg total) by mouth every 2 (two) hours as needed for migraine. May repeat in 2 hours if headache persists or recurs.  Max dose 200 mg/day   topiramate (TOPAMAX) 100 MG tablet Take 1 tablet (100 mg total) by mouth 2 (two) times daily.    Allergies  Allergen Reactions   Carvedilol Hives and Itching   Definity [Perflutren Lipid Microsphere] Other (See Comments)    Headache   Hydrocodone-Acetaminophen Itching and Nausea And Vomiting   Morphine And Related Nausea And  Vomiting   Verapamil     Racing heart, dizzyness, headache,shaking all over her body    Atenolol Rash and Other (See Comments)   Cardizem Cd [Diltiazem Hcl Er Beads] Rash   Metoprolol Rash    Headaches, dizzyness   Nebivolol Rash      Review of Systems negative except from HPI and PMH  Physical Exam BP 100/80 (BP Location: Left Arm, Patient Position: Sitting, Cuff Size: Normal)   Pulse 68   Ht 5\' 3"  (1.6 m)   Wt 128 lb (58.1 kg)   LMP 05/04/2011   BMI 22.67 kg/m  Well developed and nourished in no acute distress HENT normal Neck supple with JVP-  flat   Clear Regular rate and rhythm, no murmurs or gallops Abd-soft with active BS No Clubbing cyanosis edema Skin-warm and dry A & Oriented  Grossly normal sensory and motor function  ECG sinus @ 68    ECG   Event recorder reviewed with the patient sinus rhythm with intermittent tachycardia.  CrCl cannot be calculated (Patient's most recent lab result is older than the maximum 21 days allowed.).   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  We will continue.  0.1 mg TTS once a week.  Also will continue the flecainide 50 mg twice daily.    Still with some lightheadedness.  Reviewed salt intake, only about 200 mg/day, will increase it to 2 g/day.  Will repeat echo at 6 month followup mitral regurg

## 2021-12-30 ENCOUNTER — Observation Stay (HOSPITAL_COMMUNITY)
Admission: EM | Admit: 2021-12-30 | Discharge: 2021-12-31 | Disposition: A | Discharge status: Home / Self Care | Payer: BC Managed Care – PPO | Attending: Internal Medicine | Admitting: Internal Medicine

## 2021-12-30 ENCOUNTER — Other Ambulatory Visit: Payer: Self-pay

## 2021-12-30 ENCOUNTER — Other Ambulatory Visit: Payer: Self-pay | Admitting: Cardiology

## 2021-12-30 ENCOUNTER — Encounter (HOSPITAL_COMMUNITY): Payer: Self-pay

## 2021-12-30 ENCOUNTER — Observation Stay (HOSPITAL_COMMUNITY): Payer: BC Managed Care – PPO

## 2021-12-30 DIAGNOSIS — D696 Thrombocytopenia, unspecified: Secondary | ICD-10-CM

## 2021-12-30 DIAGNOSIS — R55 Syncope and collapse: Secondary | ICD-10-CM | POA: Diagnosis not present

## 2021-12-30 DIAGNOSIS — Z79899 Other long term (current) drug therapy: Secondary | ICD-10-CM | POA: Insufficient documentation

## 2021-12-30 DIAGNOSIS — Z87891 Personal history of nicotine dependence: Secondary | ICD-10-CM | POA: Insufficient documentation

## 2021-12-30 DIAGNOSIS — G901 Familial dysautonomia [Riley-Day]: Secondary | ICD-10-CM

## 2021-12-30 DIAGNOSIS — G909 Disorder of the autonomic nervous system, unspecified: Secondary | ICD-10-CM | POA: Diagnosis not present

## 2021-12-30 DIAGNOSIS — Z8673 Personal history of transient ischemic attack (TIA), and cerebral infarction without residual deficits: Secondary | ICD-10-CM | POA: Diagnosis not present

## 2021-12-30 DIAGNOSIS — I4729 Other ventricular tachycardia: Secondary | ICD-10-CM | POA: Diagnosis present

## 2021-12-30 LAB — COMPREHENSIVE METABOLIC PANEL
ALT: 14 U/L (ref 0–44)
AST: 16 U/L (ref 15–41)
Albumin: 4.1 g/dL (ref 3.5–5.0)
Alkaline Phosphatase: 33 U/L — ABNORMAL LOW (ref 38–126)
Anion gap: 5 (ref 5–15)
BUN: 17 mg/dL (ref 6–20)
CO2: 24 mmol/L (ref 22–32)
Calcium: 9.1 mg/dL (ref 8.9–10.3)
Chloride: 109 mmol/L (ref 98–111)
Creatinine, Ser: 0.97 mg/dL (ref 0.44–1.00)
GFR, Estimated: >60 mL/min (ref >60)
Glucose, Bld: 95 mg/dL (ref 70–99)
Potassium: 4 mmol/L (ref 3.5–5.1)
Sodium: 138 mmol/L (ref 135–145)
Total Bilirubin: 0.5 mg/dL (ref 0.3–1.2)
Total Protein: 7 g/dL (ref 6.5–8.1)

## 2021-12-30 LAB — TROPONIN I (HIGH SENSITIVITY)
Troponin I (High Sensitivity): 4 ng/L (ref <18)
Troponin I (High Sensitivity): 10 ng/L (ref <18)

## 2021-12-30 LAB — CBC WITH DIFFERENTIAL/PLATELET
Abs Immature Granulocytes: 0.04 10*3/uL (ref 0.00–0.07)
Basophils Absolute: 0 10*3/uL (ref 0.0–0.1)
Basophils Relative: 1 %
Eosinophils Absolute: 0.3 10*3/uL (ref 0.0–0.5)
Eosinophils Relative: 5 %
HCT: 39.3 % (ref 36.0–46.0)
Hemoglobin: 13.2 g/dL (ref 12.0–15.0)
Immature Granulocytes: 1 %
Lymphocytes Relative: 19 %
Lymphs Abs: 1.2 10*3/uL (ref 0.7–4.0)
MCH: 31.8 pg (ref 26.0–34.0)
MCHC: 33.6 g/dL (ref 30.0–36.0)
MCV: 94.7 fL (ref 80.0–100.0)
Monocytes Absolute: 0.4 10*3/uL (ref 0.1–1.0)
Monocytes Relative: 6 %
Neutro Abs: 4.5 10*3/uL (ref 1.7–7.7)
Neutrophils Relative %: 68 %
Platelets: 132 10*3/uL — ABNORMAL LOW (ref 150–400)
RBC: 4.15 MIL/uL (ref 3.87–5.11)
RDW: 12.5 % (ref 11.5–15.5)
WBC: 6.5 10*3/uL (ref 4.0–10.5)
nRBC: 0 % (ref 0.0–0.2)

## 2021-12-30 LAB — MAGNESIUM: Magnesium: 2.1 mg/dL (ref 1.7–2.4)

## 2021-12-30 LAB — TSH: TSH: 2.187 u[IU]/mL (ref 0.350–4.500)

## 2021-12-30 LAB — ETHANOL: Alcohol, Ethyl (B): <10 mg/dL (ref <10)

## 2021-12-30 MED ORDER — TOPIRAMATE 100 MG PO TABS
100.0000 mg | ORAL_TABLET | Freq: Two times a day (BID) | ORAL | Status: DC
  Administered 2021-12-30 – 2021-12-31 (×2): 100 mg via ORAL
  Filled 2021-12-30 (×2): qty 1

## 2021-12-30 MED ORDER — FLECAINIDE ACETATE 50 MG PO TABS
50.0000 mg | ORAL_TABLET | Freq: Two times a day (BID) | ORAL | Status: DC
  Administered 2021-12-30 – 2021-12-31 (×2): 50 mg via ORAL
  Filled 2021-12-30 (×2): qty 1

## 2021-12-30 MED ORDER — SIMVASTATIN 20 MG PO TABS
10.0000 mg | ORAL_TABLET | Freq: Every day | ORAL | Status: DC
  Administered 2021-12-30: 10 mg via ORAL
  Filled 2021-12-30: qty 1

## 2021-12-30 MED ORDER — ACETAMINOPHEN 325 MG PO TABS: 650.0000 mg | ORAL_TABLET | Freq: Four times a day (QID) | ORAL | Status: DC | PRN

## 2021-12-30 MED ORDER — ENSURE ENLIVE PO LIQD
237.0000 mL | Freq: Two times a day (BID) | ORAL | Status: DC
  Administered 2021-12-31 (×2): 237 mL via ORAL

## 2021-12-30 MED ORDER — ENOXAPARIN SODIUM 40 MG/0.4ML IJ SOSY
40.0000 mg | PREFILLED_SYRINGE | INTRAMUSCULAR | Status: DC
  Administered 2021-12-30: 40 mg via SUBCUTANEOUS
  Filled 2021-12-30: qty 0.4

## 2021-12-30 MED ORDER — SUMATRIPTAN SUCCINATE 50 MG PO TABS: 100.0000 mg | ORAL_TABLET | ORAL | Status: DC | PRN

## 2021-12-30 MED ORDER — ONDANSETRON HCL 4 MG PO TABS: 4.0000 mg | ORAL_TABLET | Freq: Four times a day (QID) | ORAL | Status: DC | PRN

## 2021-12-30 MED ORDER — ACETAMINOPHEN 650 MG RE SUPP: 650.0000 mg | Freq: Four times a day (QID) | RECTAL | Status: DC | PRN

## 2021-12-30 MED ORDER — LORAZEPAM 2 MG/ML IJ SOLN: 1.0000 mg | Freq: Once | INTRAMUSCULAR | Status: DC | PRN

## 2021-12-30 MED ORDER — FLUOXETINE HCL 20 MG PO CAPS
20.0000 mg | ORAL_CAPSULE | Freq: Every day | ORAL | Status: DC
  Administered 2021-12-30 – 2021-12-31 (×2): 20 mg via ORAL
  Filled 2021-12-30 (×2): qty 1

## 2021-12-30 MED ORDER — LACTATED RINGERS IV BOLUS
1000.0000 mL | Freq: Once | INTRAVENOUS | Status: AC
  Administered 2021-12-30: 1000 mL via INTRAVENOUS

## 2021-12-30 MED ORDER — IBUPROFEN 600 MG PO TABS
600.0000 mg | ORAL_TABLET | Freq: Once | ORAL | Status: AC
Start: 2021-12-30 — End: 2021-12-30
  Administered 2021-12-30: 600 mg via ORAL
  Filled 2021-12-30: qty 1

## 2021-12-30 MED ORDER — ONDANSETRON HCL 4 MG/2ML IJ SOLN: 4.0000 mg | Freq: Four times a day (QID) | INTRAMUSCULAR | Status: DC | PRN

## 2021-12-30 NOTE — ED Triage Notes (Signed)
EMS reports that they were called to home for +LOC. When EMS arrived, patient was alert and oriented with normal VS. States that she started to panic and cry and heart rate increased to 140's. Patient states that she does not remember events of this am. CBG was possibly low earlier per husband. Currently 164 per EMS.

## 2021-12-30 NOTE — ED Provider Notes (Signed)
[facility] MEDICAL SURGICAL UNIT Provider Note  CSN: [id number] Arrival date & time: 12/30/21 1023  Chief Complaint(s) Loss of Consciousness  HPI Tara Mahoney is a 36 y.o. 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  Patient states that she was out by the pool when she felt abnormal and asked her husband to make her an egg salad sandwich.  She then remembers feeling fluttering in her chest and states that she then woke up in the emergency department.  She states she has no recollection of any events from her syncopal episode to presenting to the emergency department.  She currently denies chest pain, shortness of breath, abdominal pain, nausea, vomiting, palpitations, leg swelling or other systemic symptoms.  Denies illicit substance use.   Past Medical History Past Medical History:  Diagnosis Date   Blood transfusion without reported diagnosis    Complication of anesthesia    Dysautonomia (HCC)    sinus arrythmia   Family history of anesthesia complication    PONV   Headache(784.0)    Heart murmur    MVP (mitral valve prolapse)    PONV (postoperative nausea and vomiting)    Ventricular tachycardia, nonsustained (HCC)    Patient Active Problem List   Diagnosis Date Noted   Syncope 12/30/2021   Intractable chronic migraine without aura and without status migrainosus 09/09/2020   Concussion with no loss of consciousness 09/09/2020   Autonomic dysfunction 03/20/2015   Chest pain 02/27/2014   Sinus tachycardia 02/06/2014   PVC (premature ventricular contraction) 05/09/2013   PAC (premature atrial contraction) 05/09/2013   Nonsustained ventricular tachycardia (HCC) 05/09/2013   Abdominal pain, left lower quadrant 02/20/2013   Mitral valve disorder 09/23/2010   PALPITATIONS 09/23/2010   Home Medication(s) Prior to Admission medications   Medication Sig Start Date End Date Taking?  Authorizing Provider  cloNIDine (CATAPRES - DOSED IN MG/24 HR) 0.1 mg/24hr patch Place 1 patch (0.1 mg total) onto the skin once a week. 10/14/21  Yes Marinus Maw, MD  flecainide (TAMBOCOR) 50 MG tablet Take 1 tablet (50 mg total) by mouth 2 (two) times daily. 09/22/21  Yes Duke Salvia, MD  FLUoxetine (PROZAC) 20 MG capsule TAKE 1 CAPSULE BY MOUTH EVERY DAY 10/15/21  Yes Rodolph Bong, MD  Magnesium Oxide 400 MG CAPS Take 1 capsule (400 mg total) by mouth daily. 08/21/21  Yes Duke Salvia, MD  Multiple Vitamin (MULTIVITAMIN WITH MINERALS) TABS tablet Take 1 tablet by mouth daily.   Yes [provider]  NON FORMULARY Take 1 tablet by mouth daily. OTC Salt Tablet   Yes [provider]  simvastatin (ZOCOR) 10 MG tablet Take 10 mg by mouth at bedtime. 10/01/21  Yes [provider]  SUMAtriptan (IMITREX) 50 MG tablet Take 2 tablets (100 mg total) by mouth every 2 (two) hours as needed for migraine. May repeat in 2 hours if headache persists or recurs.  Max dose 200 mg/day 09/16/21  Yes Rodolph Bong, MD  topiramate (TOPAMAX) 100 MG tablet Take 1 tablet (100 mg total) by mouth 2 (two) times daily. 09/16/21  Yes Rodolph Bong, MD  Past Surgical History Past Surgical History:  Procedure Laterality Date   ABDOMINAL HYSTERECTOMY     CERVICAL CERCLAGE  2008   CERVICAL CERCLAGE  09/08/2011   Procedure: CERCLAGE CERVICAL;  Surgeon: Lazaro Arms, MD;  Location: [facility];  Service: Gynecology;  Laterality: N/A;  [name],CST-scrubbed; doctor scrubbed in @ [phone]   LAPAROSCOPIC LYSIS OF ADHESIONS  08/09/2012   Procedure: LAPAROSCOPIC LYSIS OF ADHESIONS;  Surgeon: Lazaro Arms, MD;  Location: [facility];  Service: Gynecology;  Laterality: N/A;   LAPAROSCOPY  08/09/2012   Procedure: LAPAROSCOPY DIAGNOSTIC;  Surgeon: Lazaro Arms, MD;  Location: [facility];   Service: Gynecology;  Laterality: N/A;  started at  1208-Vaginal trachelectomy (vaginal removal of cervical stump)    MYRINGOTOMY     SUPRACERVICAL ABDOMINAL HYSTERECTOMY  01/26/2012   Procedure: HYSTERECTOMY SUPRACERVICAL ABDOMINAL;  Surgeon: Lazaro Arms, MD;  Location: [facility];  Service: Gynecology;;  converted [phone]   Family History Family History  Problem Relation Age of Onset   Hypertension Brother    Heart disease Brother        hole in heart   Ovarian cancer Mother    Anesthesia problems Neg Hx    Hypotension Neg Hx    Pseudochol deficiency Neg Hx    Malignant hyperthermia Neg Hx    Other Neg Hx     Social History Social History   Tobacco Use   Smoking status: Former    Years: 1.00    Types: Cigarettes    Start date: 10/01/2001    Quit date: 09/01/2009    Years since quitting: 12.3    Passive exposure: Never   Smokeless tobacco: Never   Tobacco comments:    smokes 1 pack a week  Vaping Use   Vaping Use: Never used  Substance Use Topics   Alcohol use: No    Alcohol/week: 0.0 standard drinks of alcohol   Drug use: No   Allergies Carvedilol, Definity [perflutren lipid microsphere], Hydrocodone-acetaminophen, Morphine and related, Verapamil, Atenolol, Cardizem cd [diltiazem hcl er beads], Metoprolol, and Nebivolol  Review of Systems Review of Systems  Neurological:  Positive for syncope.    Physical Exam Vital Signs  I have reviewed the triage vital signs BP 114/68 (BP Location: Right Arm)   Pulse 73   Temp 98.6 F (37 C) (Oral)   Resp 18   Ht  (1.6 m)   Wt 58.3 kg   LMP 05/04/2011   SpO2 99%   BMI 22.78 kg/m   Physical Exam Vitals and nursing note reviewed.  Constitutional:      General: She is not in acute distress.    Appearance: She is well-developed.  HENT:     Head: Normocephalic and atraumatic.  Eyes:     Conjunctiva/sclera: Conjunctivae normal.  Cardiovascular:     Rate and Rhythm: Normal rate and regular rhythm.     Heart  sounds: No murmur heard. Pulmonary:     Effort: Pulmonary effort is normal. No respiratory distress.     Breath sounds: Normal breath sounds.  Abdominal:     Palpations: Abdomen is soft.     Tenderness: There is no abdominal tenderness.  Musculoskeletal:        General: No swelling.     Cervical back: Neck supple.  Skin:    General: Skin is warm and dry.     Capillary Refill: Capillary refill takes less than 2 seconds.  Neurological:     Mental Status: She is alert.  Psychiatric:        Mood and Affect: Mood normal.     ED Results and Treatments Labs (all labs ordered are listed, but only abnormal results are displayed) Labs Reviewed  COMPREHENSIVE METABOLIC PANEL - Abnormal; Notable for the following components:      Result Value   Alkaline Phosphatase 33 (*)    All other components within normal limits  CBC WITH DIFFERENTIAL/PLATELET - Abnormal; Notable for the following components:   Platelets 132 (*)    All other components within normal limits  TSH  ETHANOL  TROPONIN I (HIGH SENSITIVITY)  TROPONIN I (HIGH SENSITIVITY)                                                                                                                          Radiology MR BRAIN WO CONTRAST  Result Date: 12/30/2021 CLINICAL DATA:  Provided history: Seizure, new onset, no history of trauma. Head CT 08/13/2020. EXAM: MRI HEAD WITHOUT CONTRAST TECHNIQUE: Multiplanar, multiecho pulse sequences of the brain and surrounding structures were obtained without intravenous contrast. COMPARISON:  None Available. FINDINGS: Brain: Cerebral volume is normal. 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 No cortical encephalomalacia is identified. No significant cerebral white matter disease. No appreciable hippocampal size or signal asymmetry. There is no acute infarct. No evidence of an intracranial mass. No chronic intracranial blood  products. No extra-axial fluid collection. No midline shift. Vascular: Maintained flow voids within the proximal large arterial vessels. Skull and upper cervical spine: No focal suspicious marrow lesion. Sinuses/Orbits: No mass or acute finding within the imaged orbits. Mild mucosal thickening within the bilateral ethmoid and maxillary sinuses. Other: Trace fluid within the left mastoid air cells. IMPRESSION: 1. No evidence of acute intracranial abnormality. 2. No specific seizure focus is identified. 3. 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 4. Otherwise unremarkable non-contrast MRI appearance of the brain. 5. Mild mucosal thickening within the bilateral ethmoid and maxillary sinuses. 6. Trace fluid within the left mastoid air cells. Electronically Signed   By: Jackey Loge D.O.   On: 12/30/2021 17:04    Pertinent labs & imaging results that were available during my care of the patient were reviewed by me and considered in my medical decision making (see MDM for details).  Medications Ordered in ED Medications  LORazepam (ATIVAN) injection 1 mg (has no administration in time range)  ibuprofen (ADVIL) tablet 600 mg (has no administration in time range)  lactated ringers bolus 1,000 mL (0 mLs Intravenous Stopped 12/30/21 1337)  Procedures Procedures  (including critical care time)  Medical Decision Making / ED Course   This patient presents to the ED for concern of syncope, this involves an extensive number of treatment options, and is a complaint that carries with it a high risk of complications and morbidity.  The differential diagnosis includes cardiogenic syncope, orthostatic syncope, vasovagal syncope, electrolyte abnormality  MDM: Patient seen emergency room for evaluation of syncope.  Physical exam is unremarkable with an  unremarkable cardiopulmonary and respiratory exam.  Neurologic exam unremarkable.  Laboratory evaluation including high-sensitivity troponin and TSH are unremarkable.  ECG with normal sinus rhythm.  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  Cardiology was consulted who recommends observation admission.  Patient then admitted to medicine for observation on a telemetry bed.   Additional history obtained:  -External records from outside source obtained and reviewed including: Chart review including previous notes, labs, imaging, consultation notes   Lab Tests: -I ordered, reviewed, and interpreted labs.   The pertinent results include:   Labs Reviewed  COMPREHENSIVE METABOLIC PANEL - Abnormal; Notable for the following components:      Result Value   Alkaline Phosphatase 33 (*)    All other components within normal limits  CBC WITH DIFFERENTIAL/PLATELET - Abnormal; Notable for the following components:   Platelets 132 (*)    All other components within normal limits  TSH  ETHANOL  TROPONIN I (HIGH SENSITIVITY)  TROPONIN I (HIGH SENSITIVITY)      EKG   EKG Interpretation  Date/Time:  Wednesday December 30 2021 10:49:38 EDT Ventricular Rate:  89 PR Interval:  163 QRS Duration: 82 QT Interval:  376 QTC Calculation: 458 R Axis:   75 Text Interpretation: Sinus rhythm Confirmed by Suzanne Garbers ([id number]) on 12/30/2021 11:45:56 AM          Medicines ordered and prescription drug management: Meds ordered this encounter  Medications   lactated ringers bolus 1,000 mL   LORazepam (ATIVAN) injection 1 mg   ibuprofen (ADVIL) tablet 600 mg    -I have reviewed the patients home medicines and have made adjustments as needed  Critical interventions none  Consultations Obtained: I requested consultation with the cardiologist,  and discussed lab and  imaging findings as well as pertinent plan - they recommend: Observation admission   Cardiac Monitoring: The patient was maintained on a cardiac monitor.  I personally viewed and interpreted the cardiac monitored which showed an underlying rhythm of: NSR  Social Determinants of Health:  Factors impacting patients care include: none   Reevaluation: After the interventions noted above, I reevaluated the patient and found that they have :improved  Co morbidities that complicate the patient evaluation  Past Medical History:  Diagnosis Date   Blood transfusion without reported diagnosis    Complication of anesthesia    Dysautonomia (HCC)    sinus arrythmia   Family history of anesthesia complication    PONV   Headache(784.0)    Heart murmur    MVP (mitral valve prolapse)    PONV (postoperative nausea and vomiting)    Ventricular tachycardia, nonsustained (HCC)       Dispostion: I considered admission for this patient, and given high concern for cardiogenic syncope, patient will require hospital admission     Final Clinical Impression(s) / ED Diagnoses Final diagnoses:  Syncope and collapse     @PCDICTATION @  Glendora Score, MD 12/30/21 1901

## 2021-12-30 NOTE — Assessment & Plan Note (Addendum)
-  EKG, troponins, electrolytes within normal limits -Check magnesium--2.1 - TSH, troponins WNL. -6/14 Brain MRI-subcentimeter chronic infarct within the inferior left cerebellar hemisphere otherwise unremarkable. -Cardiology consult appreciated>>no changes, follow up with EP -neurology consult appreciated>>MRA brain and neck which were neg for stenosis - 1 L bolus given before orthostatic vitals were checked>>orthostatic normal -EEG--no seizure

## 2021-12-30 NOTE — Consult Note (Signed)
Cardiology Consultation:   Patient ID: Tara Mahoney MRN: [id number]; DOB: Jan 09, 1986  Admit date: 12/30/2021 Date of Consult: 12/30/2021  Primary Care Provider: Alanson Puls [facility] Cardiologist: None  [facility] Electrophysiologist:  Virl Axe, MD    Patient Profile:   Tara Mahoney is a 36 y.o. female with a hx of dysautonomia, nonsustained ventricular tachycardia and PVCs, mitral valve prolapse who is being seen today for the evaluation of syncope at the request of Teressa Lower, MD.  History of Present Illness:   Ms. Cavell is a 36 year old female with a history of dysautonomia, nonsustained VT and PVCs, MVP who is followed by Dr. Caryl Comes.  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  She has been on flecainide for suppression of her arrhythmias.    She was seen in March by Dr. Caryl Comes with atypical chest pain which she thought were related to PVCs.  2D echo done in March 2023 showed normal LV function with EF 76%, normal RV, moderate MR.  She also wore a Zio patch which demonstrated an average heart rate of 87 bpm with isolated PACs and atrial couplets, isolated PVCs less than 1% PVC load.  Her symptoms that time were chest pain occasionally associated with rapid heartbeats.  She recently saw Dr. Caryl Comes earlier this month and her lightheadedness/sinus tachycardia had improved on clonidine as well as increased sodium intake.  He ate reviewed her event monitor from March which showed patient triggered events mainly during sinus rhythm.  At that office visit he was recommended to continue clonidine 0.1 mg patch weekly as well as flecainide 50 mg twice daily.  Her salt intake was increased to 2 g daily.  Today she presented to [facility] emergency room with an episode of syncope.  EMS was called and on arrival vital signs were stable but the patient became  very panicked and started to cry and her heart rate increased to the 140s.  She does not remember any of the events from this morning.  Apparently her blood sugar was low earlier per her husband but was 164 by EMS.  In ER blood work unremarkable with potassium 4, sodium 138, creatinine 0.97, magnesium 2.3, normal LFTs, troponin 4 and 10.  Cardiology is now asked to consult    Past Medical History:  Diagnosis Date   Blood transfusion without reported diagnosis    Complication of anesthesia    Dysautonomia ([place])    sinus arrythmia   Family history of anesthesia complication    PONV   [id number])    Heart murmur    MVP (mitral valve prolapse)    PONV (postoperative nausea and vomiting)    Ventricular tachycardia, nonsustained [facility])     Past Surgical History:  Procedure Laterality Date   ABDOMINAL HYSTERECTOMY     CERVICAL CERCLAGE  2008   CERVICAL CERCLAGE  09/08/2011   Procedure: CERCLAGE CERVICAL;  Surgeon: Florian Buff, MD;  Location: [facility];  Service: Gynecology;  Laterality: N/A;  [name],CST-scrubbed; doctor scrubbed in @ [place]  08/09/2012   Procedure: LAPAROSCOPIC LYSIS OF ADHESIONS;  Surgeon: Florian Buff, MD;  Location: [facility];  Service: Gynecology;  Laterality: N/A;   LAPAROSCOPY  08/09/2012   Procedure: LAPAROSCOPY DIAGNOSTIC;  Surgeon: Florian Buff, MD;  Location: [facility];  Service: Gynecology;  Laterality: N/A;  started at  1208-Vaginal trachelectomy (vaginal  removal of cervical stump)    MYRINGOTOMY     SUPRACERVICAL ABDOMINAL HYSTERECTOMY  01/26/2012   Procedure: HYSTERECTOMY SUPRACERVICAL ABDOMINAL;  Surgeon: Lazaro Arms, MD;  Location: [facility];  Service: Gynecology;;  converted [phone]     Home Medications:  Prior to Admission medications   Medication Sig Start Date End Date Taking? Authorizing Provider  cloNIDine (CATAPRES - DOSED IN MG/24 HR) 0.1 mg/24hr patch Place 1 patch (0.1 mg total) onto the skin once a week.  10/14/21  Yes Marinus Maw, MD  flecainide (TAMBOCOR) 50 MG tablet Take 1 tablet (50 mg total) by mouth 2 (two) times daily. 09/22/21  Yes Duke Salvia, MD  FLUoxetine (PROZAC) 20 MG capsule TAKE 1 CAPSULE BY MOUTH EVERY DAY 10/15/21  Yes Rodolph Bong, MD  Magnesium Oxide 400 MG CAPS Take 1 capsule (400 mg total) by mouth daily. 08/21/21  Yes Duke Salvia, MD  Multiple Vitamin (MULTIVITAMIN WITH MINERALS) TABS tablet Take 1 tablet by mouth daily.   Yes [provider]  NON FORMULARY Take 1 tablet by mouth daily. OTC Salt Tablet   Yes [provider]  simvastatin (ZOCOR) 10 MG tablet Take 10 mg by mouth at bedtime. 10/01/21  Yes [provider]  SUMAtriptan (IMITREX) 50 MG tablet Take 2 tablets (100 mg total) by mouth every 2 (two) hours as needed for migraine. May repeat in 2 hours if headache persists or recurs.  Max dose 200 mg/day 09/16/21  Yes Rodolph Bong, MD  topiramate (TOPAMAX) 100 MG tablet Take 1 tablet (100 mg total) by mouth 2 (two) times daily. 09/16/21  Yes Rodolph Bong, MD    Inpatient Medications: Scheduled Meds:  Continuous Infusions:  PRN Meds:   Allergies:    Allergies  Allergen Reactions   Carvedilol Hives and Itching   Definity [Perflutren Lipid Microsphere] Other (See Comments)    Headache   Hydrocodone-Acetaminophen Itching and Nausea And Vomiting   Morphine And Related Nausea And Vomiting   Verapamil     Racing heart, dizzyness, headache,shaking all over her body    Atenolol Rash and Other (See Comments)   Cardizem Cd [Diltiazem Hcl Er Beads] Rash   Metoprolol Rash    Headaches, dizzyness   Nebivolol Rash    Social History:   Social History   Socioeconomic History   Marital status: Married    Spouse name: Not on file   Number of children: 1   Years of education: Not on file   Highest education level: Not on file  Occupational History   Occupation: unemployed  Tobacco Use   Smoking status: Former    Years: 1.00     Types: Cigarettes    Start date: 10/01/2001    Quit date: 09/01/2009    Years since quitting: 12.3    Passive exposure: Never   Smokeless tobacco: Never   Tobacco comments:    smokes 1 pack a week  Vaping Use   Vaping Use: Never used  Substance and Sexual Activity   Alcohol use: No    Alcohol/week: 0.0 standard drinks of alcohol   Drug use: No   Sexual activity: Yes    Partners: Male    Birth control/protection: Surgical  Other Topics Concern   Not on file  Social History Narrative   Not on file   Social Determinants of Health   Financial Resource Strain: Not on file  Food Insecurity: Not on file  Transportation Needs: Not on file  Physical  Activity: Not on file  Stress: Not on file  Social Connections: Not on file  Intimate Partner Violence: Not on file    Family History:    Family History  Problem Relation Age of Onset   Hypertension Brother    Heart disease Brother        hole in heart   Ovarian cancer Mother    Anesthesia problems Neg Hx    Hypotension Neg Hx    Pseudochol deficiency Neg Hx    Malignant hyperthermia Neg Hx    Other Neg Hx      ROS:  Please see the history of present illness.   All other ROS reviewed and negative.     Physical Exam/Data:   Vitals:   12/30/21 1215 12/30/21 1230 12/30/21 1315 12/30/21 1330  BP: 115/80 108/71 113/69 125/84  Pulse: (!) 102 80 81 79  Resp: (!) 25 17 16 18   Temp:      TempSrc:      SpO2: 100% 98% 100% 100%  Weight:      Height:        Intake/Output Summary (Last 24 hours) at 12/30/2021 1339 Last data filed at 12/30/2021 1337 Gross per 24 hour  Intake 1000 ml  Output --  Net 1000 ml      12/30/2021   10:30 AM 12/17/2021    4:43 PM 10/14/2021   11:01 AM  Last 3 Weights  Weight (lbs) 119 lb 128 lb 127 lb 9.6 oz  Weight (kg) 53.978 kg 58.06 kg 57.879 kg     Body mass index is 21.08 kg/m.  General:  Well nourished, well developed, in no acute distress HEENT: normal Lymph: no adenopathy Neck: no  JVD Endocrine:  No thryomegaly Vascular: No carotid bruits; FA pulses 2+ bilaterally without bruits  Cardiac:  normal S1, S2; RRR; no murmur  Lungs:  clear to auscultation bilaterally, no wheezing, rhonchi or rales  Abd: soft, nontender, no hepatomegaly  Ext: no edema Musculoskeletal:  No deformities, BUE and BLE strength normal and equal Skin: warm and dry  Neuro:  CNs 2-12 intact, no focal abnormalities noted Psych:  Normal affect   EKG:  The EKG was personally reviewed and demonstrates: Normal sinus rhythm with no ST changes and normal intervals Telemetry:  Telemetry was personally reviewed and demonstrates: Normal sinus rhythm  Relevant CV Studies: 2D echo 09/2021 IMPRESSIONS    1. Left ventricular ejection fraction by 3D volume is 76 %. The left  ventricle has hyperdynamic function. The left ventricle has no regional  wall motion abnormalities. Left ventricular diastolic function could not  be evaluated.   2. Right ventricular systolic function is normal. The right ventricular  size is normal. There is normal pulmonary artery systolic pressure.   3. The mitral valve is normal in structure. Moderate mitral valve  regurgitation. No evidence of mitral stenosis.   4. The aortic valve is normal in structure. Aortic valve regurgitation is  trivial. No aortic stenosis is present.   5. The inferior vena cava is normal in size with greater than 50%  respiratory variability, suggesting right atrial pressure of 3 mmHg.   Zio patch 09/2021 Study Highlights  Patch Wear Time:  13 days and 23 hours (2023-02-17T16:51:29-0500 to 2023-03-03T16:40:55-0500)   Patient had a min HR of 54 bpm, max HR of 184 bpm, and avg HR of 87 bpm. Predominant underlying rhythm was Sinus Rhythm. Isolated SVEs were rare (<1.0%), SVE Couplets were rare (<1.0%), and SVE Triplets were rare (<1.0%).  Isolated VEs were rare (<1.0%),  VE Couplets were rare (<1.0%), and no VE Triplets were present.    Triggered events  per patient report were associated with pain.  Sometimes accompanied but not always with rapid heartbeats. Laboratory Data:  High Sensitivity Troponin:   Recent Labs  Lab 12/30/21 1058  TROPONINIHS 4     Chemistry Recent Labs  Lab 12/30/21 1058  NA 138  K 4.0  CL 109  CO2 24  GLUCOSE 95  BUN 17  CREATININE 0.97  CALCIUM 9.1  GFRNONAA >60  ANIONGAP 5    Recent Labs  Lab 12/30/21 1058  PROT 7.0  ALBUMIN 4.1  AST 16  ALT 14  ALKPHOS 33*  BILITOT 0.5   Hematology Recent Labs  Lab 12/30/21 1058  WBC 6.5  RBC 4.15  HGB 13.2  HCT 39.3  MCV 94.7  MCH 31.8  MCHC 33.6  RDW 12.5  PLT 132*   BNPNo results for input(s): "BNP", "PROBNP" in the last 168 hours.  DDimer No results for input(s): "DDIMER" in the last 168 hours.   Radiology/Studies:  No results found.   Assessment and Plan:   Syncope -Unclear etiology and patient does not remember any events prior to the episode -Husband reported blood sugar was low earlier in the morning but was normal by EMS in the 160s -Vital signs were stable on arrival of EMS -She does have a history of flecainide use so need to consider for arrhythmia -Check orthostatic blood pressures since she does have a history of dysautonomia.   2.  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  Dysautonomia -This has been controlled on clonidine 0.1 mg patch weekly as well as >2 g sodium diet a day -In light of recent syncopal episode we will check orthostatic blood pressures  { For questions or updates, please contact [place] Please consult [email or web address] for contact info under    Signed, Fransico Him, MD  12/30/2021 1:39 PM

## 2021-12-30 NOTE — ED Provider Notes (Signed)
Care transferred to me. Patient describes palpitations and then syncope.  Was reportedly unconscious for at least abnormal mental status for quite some time.  She states that her daughter and husband indicate she had at least some twitching and maybe some seizure-like activity. Likely post-syncope convulsions.  I discussed with neurology, Dr.Bhagat, who recommends Dr. Hortense Ramal see patient in the morning and would be okay to get MRI given no thunderclap headache or other emergent findings on history/exam with patient.  I discussed with Dr. Denton Brick who will admit   Sherwood Gambler, MD 12/30/21 [phone]

## 2021-12-30 NOTE — H&P (Signed)
History and Physical    Tara Mahoney [id number] DOB: March 16, 1986 DOA: 12/30/2021  PCP: Alanson Puls [facility]   Patient coming from: Home  I have personally briefly reviewed patient's old medical records in [place]  Chief Complaint: Passing out  HPI: Tara Mahoney is a 36 y.o. female with medical history significant for dysautonomia, nonsustained ventricular tachycardia mitral valve disorder. Patient was brought to the ED reports of loss of consciousness.  Patient tells me and reported earlier to ED provider that she does not remember most of the events of this morning.  She tells me she remembers walking by the pool, and asking her husband to fix and egg sandwich, she remembers her heart beating fast while she was sitting down and the head slumping to the side.  The next thing she remembers is waking up in the ED.  Patient's 71-year-old daughter witnessed this and called patient's spouse that was wrong with her mother.  (ED provider reports that patient's daughter saw some jerking activity while patient was unconscious.). Patient spouse called EMS, he also checked her blood sugar and reports it was 92  (patient spouse is a diabetic ). She does not remember when EMS got there. She reports palpitations when she woke up in the ED and feeling like she could not catch her breath.  Both of these have since resolved.  Twice denies feeling any other symptoms.  She has maintained good oral intake, no vomiting no loose stools.  She denies feeling like she was having a panic attack.  She also reports having a frontal headache since waking up that has not resolved, and is not similar to her migraine headaches.  She does not know how long she was out.  No history of seizures, she has history of migraines for which she takes Topamax.  She reports compliance with her clonidine 0.1 mg patch, and flecainide.  She denies alcohol or illicit drug or any other drug use not prescribed by her  physician.  Per cardiology note, when EMS arrived, vitals were stable, patient became very panicked and started to cry, heart rate increased to 140s.  EMS reports blood sugar was 164.  ED Course: Temperature 98.6.  Heart rate mostly 70s to 80s.  Respiratory rate 17-25.  Blood pressure systolic [id number].  O2 sats greater than 98% on room air.  Troponin 4 > 10.  Cardiology evaluated patient in ED, need to consider arrhythmia as she is on flecainide, check orthostatic vitals. Telemetry neurologist recommended getting an MRI of the brain.   Review of Systems: As per HPI all other systems reviewed and negative.  Past Medical History:  Diagnosis Date   Blood transfusion without reported diagnosis    Complication of anesthesia    Dysautonomia ([place])    sinus arrythmia   Family history of anesthesia complication    PONV   [id number])    Heart murmur    MVP (mitral valve prolapse)    PONV (postoperative nausea and vomiting)    Ventricular tachycardia, nonsustained [facility])     Past Surgical History:  Procedure Laterality Date   ABDOMINAL HYSTERECTOMY     CERVICAL CERCLAGE  2008   CERVICAL CERCLAGE  09/08/2011   Procedure: CERCLAGE CERVICAL;  Surgeon: Florian Buff, MD;  Location: [facility];  Service: Gynecology;  Laterality: N/A;  [name],CST-scrubbed; doctor scrubbed in @ [place]  08/09/2012   Procedure: LAPAROSCOPIC LYSIS OF ADHESIONS;  Surgeon: Florian Buff, MD;  Location: [facility];  Service: Gynecology;  Laterality: N/A;   LAPAROSCOPY  08/09/2012   Procedure: LAPAROSCOPY DIAGNOSTIC;  Surgeon: Florian Buff, MD;  Location: [facility];  Service: Gynecology;  Laterality: N/A;  started at  1208-Vaginal trachelectomy (vaginal removal of cervical stump)    MYRINGOTOMY     SUPRACERVICAL ABDOMINAL HYSTERECTOMY  01/26/2012   Procedure: HYSTERECTOMY SUPRACERVICAL ABDOMINAL;  Surgeon: Florian Buff, MD;  Location: [facility];  Service: Gynecology;;  converted [phone]      reports that she quit smoking about 12 years ago. Her smoking use included cigarettes. She started smoking about 20 years ago. She has never been exposed to tobacco smoke. She has never used smokeless tobacco. She reports that she does not drink alcohol and does not use drugs.  Allergies  Allergen Reactions   Carvedilol Hives and Itching   Definity [Perflutren Lipid Microsphere] Other (See Comments)    Headache   Hydrocodone-Acetaminophen Itching and Nausea And Vomiting   Morphine And Related Nausea And Vomiting   Verapamil     Racing heart, dizzyness, headache,shaking all over her body    Atenolol Rash and Other (See Comments)   Cardizem Cd [Diltiazem Hcl Er Beads] Rash   Metoprolol Rash    Headaches, dizzyness   Nebivolol Rash    Family History  Problem Relation Age of Onset   Hypertension Brother    Heart disease Brother        hole in heart   Ovarian cancer Mother    Anesthesia problems Neg Hx    Hypotension Neg Hx    Pseudochol deficiency Neg Hx    Malignant hyperthermia Neg Hx    Other Neg Hx    Prior to Admission medications   Medication Sig Start Date End Date Taking? Authorizing Provider  cloNIDine (CATAPRES - DOSED IN MG/24 HR) 0.1 mg/24hr patch Place 1 patch (0.1 mg total) onto the skin once a week. 10/14/21  Yes Evans Lance, MD  flecainide (TAMBOCOR) 50 MG tablet Take 1 tablet (50 mg total) by mouth 2 (two) times daily. 09/22/21  Yes Deboraha Sprang, MD  FLUoxetine (PROZAC) 20 MG capsule TAKE 1 CAPSULE BY MOUTH EVERY DAY 10/15/21  Yes Gregor Hams, MD  Magnesium Oxide 400 MG CAPS Take 1 capsule (400 mg total) by mouth daily. 08/21/21  Yes Deboraha Sprang, MD  Multiple Vitamin (MULTIVITAMIN WITH MINERALS) TABS tablet Take 1 tablet by mouth daily.   Yes [provider]  NON FORMULARY Take 1 tablet by mouth daily. OTC Salt Tablet   Yes [provider]  simvastatin (ZOCOR) 10 MG tablet Take 10 mg by mouth at bedtime. 10/01/21  Yes [provider]   SUMAtriptan (IMITREX) 50 MG tablet Take 2 tablets (100 mg total) by mouth every 2 (two) hours as needed for migraine. May repeat in 2 hours if headache persists or recurs.  Max dose 200 mg/day 09/16/21  Yes Gregor Hams, MD  topiramate (TOPAMAX) 100 MG tablet Take 1 tablet (100 mg total) by mouth 2 (two) times daily. 09/16/21  Yes Gregor Hams, MD    Physical Exam: Vitals:   12/30/21 1400 12/30/21 1430 12/30/21 1530 12/30/21 1600  BP: 120/80 126/74 104/63 105/72  Pulse: 77 85 81 77  Resp: 18 17 17 14   Temp:      TempSrc:      SpO2: 100% 100% 100% 100%  Weight:      Height:        Constitutional: NAD,  calm, comfortable Vitals:   12/30/21 1400 12/30/21 1430 12/30/21 1530 12/30/21 1600  BP: 120/80 126/74 104/63 105/72  Pulse: 77 85 81 77  Resp: 18 17 17 14   Temp:      TempSrc:      SpO2: 100% 100% 100% 100%  Weight:      Height:       Eyes: PERRL, lids and conjunctivae normal ENMT: Mucous membranes are moist. Posterior pharynx clear of any exudate or lesions.  Neck: normal, supple, no masses, no thyromegaly Respiratory: clear to auscultation bilaterally, no wheezing, no crackles. Normal respiratory effort. No accessory muscle use.  Cardiovascular: Regular rate and rhythm, no murmurs / rubs / gallops. No extremity edema. 2+ pedal pulses.  Abdomen: no tenderness, no masses palpated. No hepatosplenomegaly. Bowel sounds positive.  Musculoskeletal: no clubbing / cyanosis. No joint deformity upper and lower extremities.  Skin: no rashes, lesions, ulcers. No induration Neurologic: No apparent cranial nerve abnormalities, moving all extremities spontaneously.Marland Kitchen  Psychiatric: Normal judgment and insight. Alert and oriented x 3. Normal mood.   Labs on Admission: I have personally reviewed following labs and imaging studies  CBC: Recent Labs  Lab 12/30/21 1058  WBC 6.5  NEUTROABS 4.5  HGB 13.2  HCT 39.3  MCV 94.7  PLT [id number]*   Basic Metabolic Panel: Recent Labs  Lab  12/30/21 1058  NA 138  K 4.0  CL 109  CO2 24  GLUCOSE 95  BUN 17  CREATININE 0.97  CALCIUM 9.1   GFR: Estimated Creatinine Clearance: 65 mL/min (by C-G formula based on SCr of 0.97 mg/dL). Liver Function Tests: Recent Labs  Lab 12/30/21 1058  AST 16  ALT 14  ALKPHOS 33*  BILITOT 0.5  PROT 7.0  ALBUMIN 4.1   Thyroid Function Tests: Recent Labs    12/30/21 1059  TSH 2.187   Radiological Exams on Admission: No results found.  EKG: Independently reviewed.  Sinus rhythm rate 89, QTc 458.  No significant abnormalities compared to prior.  Assessment/Plan Principal Problem:   Syncope Active Problems:   Nonsustained ventricular tachycardia (HCC)   Autonomic dysfunction    Assessment and Plan: * Syncope EKG, troponins, electrolytes within normal limits.  History of dysautonomia, nonsustained ventricular tachycardia.  Need to consider arrhythmia considering history and being on flecainide. Reported twitching/may be seizure-like activity likely post syncope convulsions.  No hx of seizures.  Blood sugar reported to be 66 by husband was 160 by EMS. -Check magnesium, , UDS, blood alcohol - TSH, troponins WNL. 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 -Cardiology consulted, check orthostatic vitals which were WNL. - 1 L bolus given before orthostatic vitals were checked  Autonomic dysfunction Continue clonidine  Nonsustained ventricular tachycardia ([place]) Follows with Dr. Caryl Comes.   - Will Continue flecainide for now   DVT prophylaxis: Lovenox Code Status: Full Family Communication: None at bedside Disposition Plan: ~ 1 - 2 days Consults called: Cards Admission status:  Obs tele  Author: Bethena Roys, MD 12/30/2021 4:05 PM  For on call review [email or web address].

## 2021-12-30 NOTE — Assessment & Plan Note (Addendum)
She has POTS syndrome Continue clonidine TTS-1

## 2021-12-30 NOTE — Assessment & Plan Note (Addendum)
Follows with Dr. Graciela Husbands.   - Continue flecainide  - mag 2.1 -K 2.0

## 2021-12-31 ENCOUNTER — Observation Stay (HOSPITAL_COMMUNITY)
Admit: 2021-12-31 | Discharge: 2021-12-31 | Disposition: A | Discharge status: Home / Self Care | Payer: BC Managed Care – PPO | Attending: Internal Medicine | Admitting: Internal Medicine

## 2021-12-31 ENCOUNTER — Observation Stay (HOSPITAL_COMMUNITY): Payer: BC Managed Care – PPO

## 2021-12-31 DIAGNOSIS — G909 Disorder of the autonomic nervous system, unspecified: Secondary | ICD-10-CM | POA: Diagnosis not present

## 2021-12-31 DIAGNOSIS — I6930 Unspecified sequelae of cerebral infarction: Secondary | ICD-10-CM | POA: Diagnosis not present

## 2021-12-31 DIAGNOSIS — D696 Thrombocytopenia, unspecified: Secondary | ICD-10-CM | POA: Diagnosis not present

## 2021-12-31 DIAGNOSIS — R55 Syncope and collapse: Secondary | ICD-10-CM | POA: Diagnosis not present

## 2021-12-31 DIAGNOSIS — I4729 Other ventricular tachycardia: Secondary | ICD-10-CM | POA: Diagnosis not present

## 2021-12-31 LAB — LIPID PANEL
Cholesterol: 129 mg/dL (ref 0–200)
HDL: 35 mg/dL — ABNORMAL LOW (ref >40)
LDL Cholesterol: 77 mg/dL (ref 0–99)
Total CHOL/HDL Ratio: 3.7 RATIO
Triglycerides: 85 mg/dL (ref <150)
VLDL: 17 mg/dL (ref 0–40)

## 2021-12-31 LAB — VITAMIN B12: Vitamin B-12: 332 pg/mL (ref 180–914)

## 2021-12-31 LAB — CK: Total CK: 39 U/L (ref 38–234)

## 2021-12-31 LAB — HIV ANTIBODY (ROUTINE TESTING W REFLEX): HIV Screen 4th Generation wRfx: NONREACTIVE

## 2021-12-31 LAB — FOLATE: Folate: 28.1 ng/mL (ref >5.9)

## 2021-12-31 MED ORDER — ASPIRIN 81 MG PO CHEW: 81.0000 mg | CHEWABLE_TABLET | Freq: Every day | ORAL | Status: AC

## 2021-12-31 MED ORDER — GADOBUTROL 1 MMOL/ML IV SOLN
6.0000 mL | Freq: Once | INTRAVENOUS | Status: AC | PRN
  Administered 2021-12-31: 6 mL via INTRAVENOUS

## 2021-12-31 MED ORDER — MAGNESIUM OXIDE -MG SUPPLEMENT 400 (240 MG) MG PO TABS
400.0000 mg | ORAL_TABLET | Freq: Every day | ORAL | Status: DC
  Administered 2021-12-31: 400 mg via ORAL
  Filled 2021-12-31: qty 1

## 2021-12-31 MED ORDER — ASPIRIN 81 MG PO CHEW
81.0000 mg | CHEWABLE_TABLET | Freq: Every day | ORAL | Status: DC
  Administered 2021-12-31: 81 mg via ORAL
  Filled 2021-12-31: qty 1

## 2021-12-31 NOTE — Progress Notes (Signed)
  Transition of Care [facility]) Screening Note   Patient Details  Name: Tara Mahoney Date of Birth: 1985-11-15   Transition of Care [facility]) CM/SW Contact:    Annice Needy, LCSW Phone Number: 12/31/2021, 2:54 PM    Transition of Care Department [facility]) has reviewed patient and no TOC needs have been identified at this time. We will continue to monitor patient advancement through interdisciplinary progression rounds. If new patient transition needs arise, please place a TOC consult.

## 2021-12-31 NOTE — Consult Note (Signed)
I connected with  Tara Mahoney on 12/31/21 by a video enabled telemedicine application and verified that I am speaking with the correct person using two identifiers.   I discussed the limitations of evaluation and management by telemedicine. The patient expressed understanding and agreed to proceed.  Location of patient: [facility] Location of physician: [facility]  Neurology Consultation Reason for Consult: Stroke Referring Physician: Dr. Onalee Hua Tat  CC: Syncope  History is obtained from: Patient, chart review  HPI: Tara Mahoney is a 36 y.o. 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  MRI was obtained as part of work-up and showed chronic left cerebellar infarct.  Therefore neurology was consulted.  In terms of alteration of awareness, patient was at home and reports palpitations followed by syncope.  Patient was noted to have some seizure-like activity after syncope, no incontinence, tongue bite.  Denies ever having seizure-like activity in the past.  ROS: All other systems reviewed and negative except as noted in the HPI.   Past Medical History:  Diagnosis Date   Blood transfusion without reported diagnosis    Complication of anesthesia    Dysautonomia (HCC)    sinus arrythmia   Family history of anesthesia complication    PONV   Headache(784.0)    Heart murmur    MVP (mitral valve prolapse)    PONV (postoperative nausea and vomiting)    Ventricular tachycardia, nonsustained (HCC)     Family History  Problem Relation Age of Onset   Hypertension Brother    Heart disease Brother        hole in heart   Ovarian cancer Mother    Anesthesia problems Neg Hx    Hypotension Neg Hx    Pseudochol deficiency Neg Hx    Malignant hyperthermia Neg Hx    Other Neg Hx     Social History:  reports that she quit smoking about 12  years ago. Her smoking use included cigarettes. She started smoking about 20 years ago. She has never been exposed to tobacco smoke. She has never used smokeless tobacco. She reports that she does not drink alcohol and does not use drugs.   Medications Prior to Admission  Medication Sig Dispense Refill Last Dose   cloNIDine (CATAPRES - DOSED IN MG/24 HR) 0.1 mg/24hr patch Place 1 patch (0.1 mg total) onto the skin once a week. 4 patch 12 Past Week   flecainide (TAMBOCOR) 50 MG tablet Take 1 tablet (50 mg total) by mouth 2 (two) times daily. 180 tablet 3 12/30/2021   FLUoxetine (PROZAC) 20 MG capsule TAKE 1 CAPSULE BY MOUTH EVERY DAY 90 capsule 1 12/29/2021   Magnesium Oxide 400 MG CAPS Take 1 capsule (400 mg total) by mouth daily. 90 capsule 0 12/30/2021   Multiple Vitamin (MULTIVITAMIN WITH MINERALS) TABS tablet Take 1 tablet by mouth daily.   12/30/2021   NON FORMULARY Take 1 tablet by mouth daily. OTC Salt Tablet   12/30/2021   simvastatin (ZOCOR) 10 MG tablet Take 10 mg by mouth at bedtime.   12/29/2021   SUMAtriptan (IMITREX) 50 MG tablet Take 2 tablets (100 mg total) by mouth every 2 (two) hours as needed for migraine. May repeat in 2 hours if headache persists or recurs.  Max dose 200 mg/day 9 tablet 12 Past Week   topiramate (TOPAMAX) 100 MG tablet Take 1 tablet (100 mg total)  by mouth 2 (two) times daily. 60 tablet 3 12/30/2021      Exam: Current vital signs: BP 104/66 (BP Location: Left Arm)   Pulse 72   Temp 98.1 F (36.7 C)   Resp 18   Ht 5\' 3"  (1.6 m)   Wt 58.3 kg   LMP 05/04/2011   SpO2 99%   BMI 22.78 kg/m  Vital signs in last 24 hours: Temp:  [97.4 F (36.3 C)-98.6 F (37 C)] 98.1 F (36.7 C) (06/15 0434) Pulse Rate:  [72-102] 72 (06/15 0434) Resp:  [14-25] 18 (06/15 0434) BP: (103-126)/(63-85) 104/66 (06/15 0434) SpO2:  [98 %-100 %] 99 % (06/15 0434) Weight:  [54 kg-58.3 kg] 58.3 kg (06/14 1810)   Physical Exam  Constitutional: Appears well-developed and  well-nourished.  Psych: Affect appropriate to situation Eyes: No scleral injection Neuro: AOx3, cranial nerves II to XII are grossly intact, antigravity strength in upper extremities with no drift, FTN intact bilaterally   I have reviewed labs in epic and the results pertinent to this consultation are: CBC:  Recent Labs  Lab 12/30/21 1058  WBC 6.5  NEUTROABS 4.5  HGB 13.2  HCT 39.3  MCV 94.7  PLT 132*    Basic Metabolic Panel:  Lab Results  Component Value Date   NA 138 12/30/2021   K 4.0 12/30/2021   CO2 24 12/30/2021   GLUCOSE 95 12/30/2021   BUN 17 12/30/2021   CREATININE 0.97 12/30/2021   CALCIUM 9.1 12/30/2021   GFRNONAA >60 12/30/2021   GFRAA >60 09/29/2016   Lipid Panel:  Lab Results  Component Value Date   LDLCALC 62 09/17/2015   HgbA1c:  Lab Results  Component Value Date   HGBA1C 5.2 09/17/2015   Urine Drug Screen: No results found for: "LABOPIA", "COCAINSCRNUR", "LABBENZ", "AMPHETMU", "THCU", "LABBARB"  Alcohol Level     Component Value Date/Time   ETH <10 12/30/2021 1617     I have reviewed the images obtained:  MRI brain without contrast 12/30/2021: No evidence of acute intracranial abnormality. No specific seizure focus is identified. 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 Otherwise unremarkable non-contrast MRI appearance of the brain.  ASSESSMENT/PLAN: 36 year old female with history of POTS presented with syncope followed by jerking.  MRI brain was obtained which showed chronic left cerebellar infarct.  Chronic infarct, left cerebellum Syncope -The semiology of the episode is most likely suggestive of convulsive syncope.   -Routine EEG ordered and pending  -If EEG does not show any evidence of epileptogenicity. This was the first episode without convincing semiology for epilepsy and absence of any other epilepsy risk factors.  Therefore do not recommend any antiseizure medications at this point -We will  order MRA head and neck to further evaluate the chronic stroke given patient's young age. -Recommend starting aspirin 81 mg daily -Lipid panel ordered, continue statin -Will defer further management of syncope to cardiologist -Discussed plan with Dr.Tat  Thank you for allowing 20 to participate in the care of this patient. If you have any further questions, please contact  me or neurohospitalist.   [place] [facility]

## 2021-12-31 NOTE — Progress Notes (Signed)
PROGRESS NOTE  Tara Mahoney JHE:[id number] DOB: [birth year] DOA: 12/30/2021 PCP: [facility]  Brief History:  36 year old female with a history of postural tachycardia syndrome (POTS), syncope, NS Vtach on flecainide, and mitral valve prolapse presenting syncope.  The patient was helping her husband clean and vacuum their swimming pool and was feeling fine.  She subsequently let her dogs into the house after which she felt some palpitations and shortness of breath.  She went to sit down on the couch after which she had a syncopal episode.  The next thing she remembered was waking up in the ambulance.  She did not bite her tongue.  There is no bowel or bladder incontinence.  Her last syncopal episode was over 1-1/2 years ago.  She was started on clonidine patch for her intermittent tachycardia and dysautonomia by her cardiologist, Dr. Graciela Husbands.  She also had a recent Zio patch back in March 2023 which showed predominantly sinus rhythm with a rare SVE and rare VE events.  She denies any new prescription or over-the-counter medications.  She does not drink or smoke.  The patient has been in her usual state of health prior to this current event.  Apparently, her 53-year-old daughter may have seen some extremity jerking during her syncopal episode.  Her husband checked her CBG earlier in the day and it was 46, but EMS checked her sugar and it was 164.  Orthostatic vital signs were negative in the ED. In the ED, the patient was afebrile hemodynamically stable with oxygen saturation 100% room air.  CBC, LFTs, and BMP were unremarkable.  Magnesium 2.1.  Chest x-ray was negative.  EKG showed normal sinus rhythm.  MRI of the brain showed a subcm chronic infarct in the inferior left cerebellar hemisphere.  Otherwise the brain was unremarkable.  Cardiology was consulted.     Assessment and Plan: * Syncope -EKG, troponins, electrolytes within normal limits -Check magnesium--2.1 - TSH, troponins  WNL. -6/14 Brain MRI-subcentimeter chronic infarct within the inferior left cerebellar hemisphere otherwise unremarkable. -Cardiology consult appreciated -neurology consult - 1 L bolus given before orthostatic vitals were checked>>orthostatic normal -EEG  Thrombocytopenia (HCC) Chronic--dating back to July 2008 -B12 -folate -TSH 2.187 -outpt hematology referral  Autonomic dysfunction She has POTS syndrome Continue clonidine TTS-1  Nonsustained ventricular tachycardia (HCC) Follows with Dr. Graciela Husbands.   - Continue flecainide  - mag 2.1 -K 2.0      Family Communication:   no Family at bedside  Consultants:  cardiology; neurology  Code Status:  FULL  DVT Prophylaxis:  [place] Lovenox   Procedures: As Listed in Progress Note Above  Antibiotics: None       Subjective: Patient denies fevers, chills, headache, chest pain, dyspnea, nausea, vomiting, diarrhea, abdominal pain, dysuria, hematuria, hematochezia, and melena.   Objective: Vitals:   12/30/21 1730 12/30/21 1810 12/30/21 2108 12/31/21 0434  BP: 119/85 114/68 103/67 104/66  Pulse: 78 73 86 72  Resp: 18 18 18 18   Temp:  98.6 F (37 C) (!) 97.4 F (36.3 C) 98.1 F (36.7 C)  TempSrc:  Oral Oral   SpO2: 100% 99% 100% 99%  Weight:  58.3 kg    Height:  5\' 3"  (1.6 m)      Intake/Output Summary (Last 24 hours) at 12/31/2021 0820 Last data filed at 12/30/2021 1337 Gross per 24 hour  Intake 1000 ml  Output --  Net 1000 ml   Weight change:  Exam:  General:  Pt is alert, follows commands appropriately, not in acute distress HEENT: No icterus, No thrush, No neck mass, [place]/AT Cardiovascular: RRR, S1/S2, no rubs, no gallops Respiratory: CTA bilaterally, no wheezing, no crackles, no rhonchi Abdomen: Soft/+BS, non tender, non distended, no guarding Extremities: No edema, No lymphangitis, No petechiae, No rashes, no synovitis Neuro:  CN II-XII intact, strength 4/5 in RUE, RLE, strength 4/5 LUE, LLE; sensation  intact bilateral; no dysmetria; babinski equivocal    Data Reviewed: I have personally reviewed following labs and imaging studies Basic Metabolic Panel: Recent Labs  Lab 12/30/21 1058 12/30/21 1924  NA 138  --   K 4.0  --   CL 109  --   CO2 24  --   GLUCOSE 95  --   BUN 17  --   CREATININE 0.97  --   CALCIUM 9.1  --   MG  --  2.1   Liver Function Tests: Recent Labs  Lab 12/30/21 1058  AST 16  ALT 14  ALKPHOS 33*  BILITOT 0.5  PROT 7.0  ALBUMIN 4.1   No results for input(s): "LIPASE", "AMYLASE" in the last 168 hours. No results for input(s): "AMMONIA" in the last 168 hours. Coagulation Profile: No results for input(s): "INR", "PROTIME" in the last 168 hours. CBC: Recent Labs  Lab 12/30/21 1058  WBC 6.5  NEUTROABS 4.5  HGB 13.2  HCT 39.3  MCV 94.7  PLT 132*   Cardiac Enzymes: No results for input(s): "CKTOTAL", "CKMB", "CKMBINDEX", "TROPONINI" in the last 168 hours. BNP: Invalid input(s): "POCBNP" CBG: No results for input(s): "GLUCAP" in the last 168 hours. HbA1C: No results for input(s): "HGBA1C" in the last 72 hours. Urine analysis:    Component Value Date/Time   COLORURINE YELLOW 01/16/2015 1409   APPEARANCEUR CLEAR 01/16/2015 1409   LABSPEC >1.030 (H) 01/16/2015 1409   PHURINE 5.0 01/16/2015 1409   GLUCOSEU NEGATIVE 01/16/2015 1409   HGBUR NEGATIVE 01/16/2015 1409   BILIRUBINUR NEGATIVE 01/16/2015 1409   KETONESUR NEGATIVE 01/16/2015 1409   PROTEINUR NEGATIVE 01/16/2015 1409   UROBILINOGEN 0.2 01/16/2015 1409   NITRITE NEGATIVE 01/16/2015 1409   LEUKOCYTESUR NEGATIVE 01/16/2015 1409   Sepsis Labs: @LABRCNTIP (procalcitonin:4,lacticidven:4) )No results found for this or any previous visit (from the past 240 hour(s)).   Scheduled Meds:  enoxaparin (LOVENOX) injection  40 mg Subcutaneous Q24H   feeding supplement  237 mL Oral BID BM   flecainide  50 mg Oral BID   FLUoxetine  20 mg Oral Daily   simvastatin  10 mg Oral QHS   topiramate   100 mg Oral BID   Continuous Infusions:  Procedures/Studies: DG Chest 2 View  Result Date: 12/30/2021 CLINICAL DATA:  Ventricular tachycardia EXAM: CHEST - 2 VIEW COMPARISON:  None Available. FINDINGS: The heart size and mediastinal contours are within normal limits. Both lungs are clear. The visualized skeletal structures are unremarkable. IMPRESSION: No active cardiopulmonary disease. Electronically Signed   By: 01/01/2022 M.D.   On: 12/30/2021 23:44   MR BRAIN WO CONTRAST  Result Date: 12/30/2021 CLINICAL DATA:  Provided history: Seizure, new onset, no history of trauma. Head CT 08/13/2020. EXAM: MRI HEAD WITHOUT CONTRAST TECHNIQUE: Multiplanar, multiecho pulse sequences of the brain and surrounding structures were obtained without intravenous contrast. COMPARISON:  None Available. FINDINGS: Brain: Cerebral volume is normal. 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 No cortical encephalomalacia is identified. No significant cerebral white matter disease. No appreciable  hippocampal size or signal asymmetry. There is no acute infarct. No evidence of an intracranial mass. No chronic intracranial blood products. No extra-axial fluid collection. No midline shift. Vascular: Maintained flow voids within the proximal large arterial vessels. Skull and upper cervical spine: No focal suspicious marrow lesion. Sinuses/Orbits: No mass or acute finding within the imaged orbits. Mild mucosal thickening within the bilateral ethmoid and maxillary sinuses. Other: Trace fluid within the left mastoid air cells. IMPRESSION: 1. No evidence of acute intracranial abnormality. 2. No specific seizure focus is identified. 3. 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 4. Otherwise unremarkable non-contrast MRI appearance of the brain. 5. Mild mucosal thickening within the bilateral ethmoid and maxillary sinuses. 6.  Trace fluid within the left mastoid air cells. Electronically Signed   By: Jackey Loge D.O.   On: 12/30/2021 17:04    Catarina Hartshorn, DO  [facility]  If 7PM-7AM, please contact night-coverage [email or web address] Password [email or web address] 12/31/2021, 8:20 AM   LOS: 0 days

## 2021-12-31 NOTE — Progress Notes (Signed)
EEG complete - results pending 

## 2021-12-31 NOTE — Discharge Summary (Addendum)
Physician Discharge Summary   Patient: Lenaya Pietsch MRN: [id number] DOB: [birth year]  Admit date:     12/30/2021  Discharge date: 12/31/21  Discharge Physician: Onalee Hua Carolanne Mercier   PCP: Ponciano Ort, [facility]   Recommendations at discharge:   Please follow up with primary care provider within 1-2 weeks  Please repeat BMP and CBC in one week    Hospital Course: 36 year old female with a history of postural tachycardia syndrome (POTS), syncope, NS Vtach on flecainide, and mitral valve prolapse presenting syncope.  The patient was helping her husband clean and vacuum their swimming pool and was feeling fine.  She subsequently let her dogs into the house after which she felt some palpitations and shortness of breath.  She went to sit down on the couch after which she had a syncopal episode.  The next thing she remembered was waking up in the ambulance.  She did not bite her tongue.  There is no bowel or bladder incontinence.  Her last syncopal episode was over 1-1/2 years ago.  She was started on clonidine patch for her intermittent tachycardia and dysautonomia by her cardiologist, Dr. Graciela Husbands.  She also had a recent Zio patch back in March 2023 which showed predominantly sinus rhythm with a rare SVE and rare VE events.  She denies any new prescription or over-the-counter medications.  She does not drink or smoke.  The patient has been in her usual state of health prior to this current event.  Apparently, her 46-year-old daughter may have seen some extremity jerking during her syncopal episode.  Her husband checked her CBG earlier in the day and it was 58, but EMS checked her sugar and it was 164.  Orthostatic vital signs were negative in the ED. In the ED, the patient was afebrile hemodynamically stable with oxygen saturation 100% room air.  CBC, LFTs, and BMP were unremarkable.  Magnesium 2.1.  Chest x-ray was negative.  EKG showed normal sinus rhythm.  MRI of the brain showed a subcm chronic infarct in the  inferior left cerebellar hemisphere.  Otherwise the brain was unremarkable.  Cardiology was consulted.  Assessment and Plan: * Syncope -EKG, troponins, electrolytes within normal limits -Check magnesium--2.1 - TSH, troponins WNL. -6/14 Brain MRI-subcentimeter chronic infarct within the inferior left cerebellar hemisphere otherwise unremarkable. -Cardiology consult appreciated>>no changes, follow up with EP -neurology consult appreciated>>MRA brain and neck which were neg for stenosis - 1 L bolus given before orthostatic vitals were checked>>orthostatic normal -EEG--no seizure  Thrombocytopenia (HCC) Chronic--dating back to July 2008 -B12 -folate -TSH 2.187 -outpt hematology referral  Autonomic dysfunction She has POTS syndrome Continue clonidine TTS-1  Nonsustained ventricular tachycardia (HCC) Follows with Dr. Graciela Husbands.   - Continue flecainide  - mag 2.1 -K 2.0  History of Stroke -incidental finding on MRI brain of left cerebellar hemisphere -MRA head and neck negative for clinically significant stenosis -start ASA 81 mg daily per neurology       Consultants: cardiology, neurology Procedures performed: EEG  Disposition: Home Diet recommendation:  Regular diet DISCHARGE MEDICATION: Allergies as of 12/31/2021       Reactions   Carvedilol Hives, Itching   Definity [perflutren Lipid Microsphere] Other (See Comments)   Headache   Hydrocodone-acetaminophen Itching, Nausea And Vomiting   Morphine And Related Nausea And Vomiting   Verapamil    Racing heart, dizzyness, headache,shaking all over her body    Atenolol Rash, Other (See Comments)   Cardizem Cd [diltiazem Hcl Er Beads] Rash   Metoprolol Rash  Headaches, dizzyness   Nebivolol Rash        Medication List     TAKE these medications    aspirin 81 MG chewable tablet Chew 1 tablet (81 mg total) by mouth daily. Start taking on: January 01, 2022   cloNIDine 0.1 mg/24hr patch Commonly known as: CATAPRES -  Dosed in mg/24 hr Place 1 patch (0.1 mg total) onto the skin once a week.   flecainide 50 MG tablet Commonly known as: TAMBOCOR Take 1 tablet (50 mg total) by mouth 2 (two) times daily.   FLUoxetine 20 MG capsule Commonly known as: PROZAC TAKE 1 CAPSULE BY MOUTH EVERY DAY   Magnesium Oxide 400 MG Caps Take 1 capsule (400 mg total) by mouth daily.   multivitamin with minerals Tabs tablet Take 1 tablet by mouth daily.   NON FORMULARY Take 1 tablet by mouth daily. OTC Salt Tablet   simvastatin 10 MG tablet Commonly known as: ZOCOR Take 10 mg by mouth at bedtime.   SUMAtriptan 50 MG tablet Commonly known as: IMITREX Take 2 tablets (100 mg total) by mouth every 2 (two) hours as needed for migraine. May repeat in 2 hours if headache persists or recurs.  Max dose 200 mg/day   topiramate 100 MG tablet Commonly known as: TOPAMAX Take 1 tablet (100 mg total) by mouth 2 (two) times daily.        Follow-up Information     Deboraha Sprang, MD Follow up on 01/29/2022.   Specialty: Cardiology Why: Follow-up with Dr. Caryl Comes on 01/29/2022 at 10:45 AM. Contact information: [id number] [address] [phone]         Cameron Sprang, MD Follow up in 1 month(s).   Specialty: Neurology Contact information: [address] [place] [address] [phone]                Discharge Exam: Danley Danker Weights   12/30/21 1030 12/30/21 1810  Weight: 54 kg 58.3 kg   HEENT:  [place]/AT, No thrush, no icterus CV:  RRR, no rub, no S3, no S4 Lung:  CTA, no wheeze, no rhonchi Abd:  soft/+BS, NT Ext:  No edema, no lymphangitis, no synovitis, no rash   Condition at discharge: stable  The results of significant diagnostics from this hospitalization (including imaging, microbiology, ancillary and laboratory) are listed below for reference.   Imaging Studies: EEG adult  Result Date: 01-21-2022 Lora Havens, MD     01-21-2022  6:21 PM  Patient Name: Ardyce Irelan MRN: [id number] Epilepsy Attending: Lora Havens Referring Physician/Provider: Orson Eva, MD Date: 2022-01-21 Duration: 23.08 mins Patient history: 36yo F with transient alteration of awareness. EEG to evaluate for seizure. Level of alertness: Awake AEDs during EEG study: TPM Technical aspects: This EEG study was done with scalp electrodes positioned according to the 10-20 International system of electrode placement. Electrical activity was acquired at a sampling rate of 500Hz  and reviewed with a high frequency filter of 70Hz  and a low frequency filter of 1Hz . EEG data were recorded continuously and digitally stored. Description: The posterior dominant rhythm consists of 10 Hz activity of moderate voltage (25-35 uV) seen predominantly in posterior head regions, symmetric and reactive to eye opening and eye closing. Hyperventilation and photic stimulation were not performed.   IMPRESSION: This study is within normal limits. No seizures or epileptiform discharges were seen throughout the recording. [place]   MR ANGIO NECK W WO CONTRAST  Result  Date: 12/31/2021 CLINICAL DATA:  36 year old female with seizure, Brain MRI yesterday reveals small chronic infarct in the posterior left cerebellum. EXAM: MRA NECK WITHOUT AND WITH CONTRAST TECHNIQUE: Multiplanar and multiecho pulse sequences of the neck were obtained without and with intravenous contrast. Angiographic images of the neck were obtained using MRA technique without and with intravenous contrast. CONTRAST:  39mL GADAVIST GADOBUTROL 1 MMOL/ML IV SOLN COMPARISON:  Brain MRI yesterday.  Intracranial MRA today. FINDINGS: Precontrast time-of-flight neck MRA reveals antegrade flow in both cervical carotid and vertebral arteries. Carotid bifurcations appear normal. Post-contrast neck MRA reveals a 3 vessel arch configuration. Proximal great vessels appear normal. Bilateral Common carotid arteries and carotid bifurcations appear  normal. Bilateral cervical ICAs appear normal. And visible bilateral ICA siphons and termini are patent. Normal proximal subclavian arteries. Codominant vertebral arteries. Both vertebral artery origins appear normal. No evidence of vertebral artery stenosis in the neck or the posterior fossa. Patent vertebrobasilar junction. Visible basilar artery appears normal. IMPRESSION: Negative Neck MRA. Electronically Signed   By: Genevie Ann M.D.   On: 12/31/2021 11:16   MR ANGIO HEAD WO CONTRAST  Result Date: 12/31/2021 CLINICAL DATA:  36 year old female with seizure, Brain MRI yesterday reveals small chronic infarct in the posterior left cerebellum. EXAM: MRA HEAD WITHOUT CONTRAST TECHNIQUE: Angiographic images of the Circle of Willis were acquired using MRA technique without intravenous contrast. COMPARISON:  Brain MRI 12/30/2021. FINDINGS: Anterior circulation: Antegrade flow in both ICA siphons. No siphon stenosis. Normal ophthalmic and right posterior communicating artery origins. Patent carotid termini, MCA and ACA origins. Normal anterior communicating artery. Bilateral ACA branches are within normal limits. MCA M1 segments and MCA bifurcations are patent without stenosis. Visible bilateral MCA branches appear normal. Posterior circulation: Antegrade flow in the posterior circulation with codominant distal vertebral arteries. No distal vertebral or vertebrobasilar junction stenosis. Both PICA are patent. Patent basilar artery without stenosis. Patent AICA, SCA, and PCA origins. Fetal type right PCA origin. Left posterior communicating artery diminutive or absent. Bilateral PCA branches are within normal limits. Anatomic variants: Fetal type right PCA origin. Other: No intracranial mass effect or ventriculomegaly. IMPRESSION: Negative intracranial MRA. Electronically Signed   By: Genevie Ann M.D.   On: 12/31/2021 11:14   DG Chest 2 View  Result Date: 12/30/2021 CLINICAL DATA:  Ventricular tachycardia EXAM: CHEST - 2  VIEW COMPARISON:  None Available. FINDINGS: The heart size and mediastinal contours are within normal limits. Both lungs are clear. The visualized skeletal structures are unremarkable. IMPRESSION: No active cardiopulmonary disease. Electronically Signed   By: Fidela Salisbury M.D.   On: 12/30/2021 23:44   MR BRAIN WO CONTRAST  Result Date: 12/30/2021 CLINICAL DATA:  Provided history: Seizure, new onset, no history of trauma. Head CT 08/13/2020. EXAM: MRI HEAD WITHOUT CONTRAST TECHNIQUE: Multiplanar, multiecho pulse sequences of the brain and surrounding structures were obtained without intravenous contrast. COMPARISON:  None Available. FINDINGS: Brain: Cerebral volume is normal. 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 No cortical encephalomalacia is identified. No significant cerebral white matter disease. No appreciable hippocampal size or signal asymmetry. There is no acute infarct. No evidence of an intracranial mass. No chronic intracranial blood products. No extra-axial fluid collection. No midline shift. Vascular: Maintained flow voids within the proximal large arterial vessels. Skull and upper cervical spine: No focal suspicious marrow lesion. Sinuses/Orbits: No mass or acute finding within the imaged orbits. Mild mucosal thickening within the bilateral ethmoid and maxillary  sinuses. Other: Trace fluid within the left mastoid air cells. IMPRESSION: 1. No evidence of acute intracranial abnormality. 2. No specific seizure focus is identified. 3. 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 4. Otherwise unremarkable non-contrast MRI appearance of the brain. 5. Mild mucosal thickening within the bilateral ethmoid and maxillary sinuses. 6. Trace fluid within the left mastoid air cells. Electronically Signed   By: Jackey Loge D.O.   On: 12/30/2021 17:04    Microbiology: Results for orders placed or  performed during the hospital encounter of 08/03/12  Surgical pcr screen     Status: Abnormal   Collection Time: 08/03/12 10:50 AM  Result Value Ref Range Status   MRSA, PCR NEGATIVE NEGATIVE Final   Staphylococcus aureus POSITIVE (A) NEGATIVE Final    Comment:        The Xpert SA Assay (FDA approved for NASAL specimens in patients over 11 years of age), is one component of a comprehensive surveillance program.  Test performance has been validated by [employer] for patients greater than or equal to 61 year old. It is not intended to diagnose infection nor to guide or monitor treatment. RESULT CALLED TO, READ BACK BY AND VERIFIED WITH: CANADY,K. AT 1355 ON 08/03/2012 BY BAUGHAM,M.    Labs: CBC: Recent Labs  Lab 12/30/21 1058  WBC 6.5  NEUTROABS 4.5  HGB 13.2  HCT 39.3  MCV 94.7  PLT 132*   Basic Metabolic Panel: Recent Labs  Lab 12/30/21 1058 12/30/21 1924  NA 138  --   K 4.0  --   CL 109  --   CO2 24  --   GLUCOSE 95  --   BUN 17  --   CREATININE 0.97  --   CALCIUM 9.1  --   MG  --  2.1   Liver Function Tests: Recent Labs  Lab 12/30/21 1058  AST 16  ALT 14  ALKPHOS 33*  BILITOT 0.5  PROT 7.0  ALBUMIN 4.1   CBG: No results for input(s): "GLUCAP" in the last 168 hours.  Discharge time spent: greater than 30 minutes.  Signed: Catarina Hartshorn, MD [facility] 12/31/2021

## 2021-12-31 NOTE — Progress Notes (Signed)
Patient was given ordered Ibuprofen 600mg  for a headache. Informed patient to let staff know when she has to use the restroom. She was taken down during this shift to get a chest X-ray when She got back from x-ray, patient slept majority of the night.

## 2021-12-31 NOTE — Hospital Course (Signed)
36 year old female with a history of postural tachycardia syndrome (POTS), syncope, NS Vtach on flecainide, and mitral valve prolapse presenting syncope.  The patient was helping her husband clean and vacuum their swimming pool and was feeling fine.  She subsequently let her dogs into the house after which she felt some palpitations and shortness of breath.  She went to sit down on the couch after which she had a syncopal episode.  The next thing she remembered was waking up in the ambulance.  She did not bite her tongue.  There is no bowel or bladder incontinence.  Her last syncopal episode was over 1-1/2 years ago.  She was started on clonidine patch for her intermittent tachycardia and dysautonomia by her cardiologist, Dr. Graciela Husbands.  She also had a recent Zio patch back in March 2023 which showed predominantly sinus rhythm with a rare SVE and rare VE events.  She denies any new prescription or over-the-counter medications.  She does not drink or smoke.  The patient has been in her usual state of health prior to this current event.  Apparently, her 45-year-old daughter may have seen some extremity jerking during her syncopal episode.  Her husband checked her CBG earlier in the day and it was 35, but EMS checked her sugar and it was 164.  Orthostatic vital signs were negative in the ED. In the ED, the patient was afebrile hemodynamically stable with oxygen saturation 100% room air.  CBC, LFTs, and BMP were unremarkable.  Magnesium 2.1.  Chest x-ray was negative.  EKG showed normal sinus rhythm.  MRI of the brain showed a subcm chronic infarct in the inferior left cerebellar hemisphere.  Otherwise the brain was unremarkable.  Cardiology was consulted.

## 2021-12-31 NOTE — Assessment & Plan Note (Signed)
Chronic--dating back to July 2008 -B12 -folate -TSH 2.187 -outpt hematology referral

## 2021-12-31 NOTE — Procedures (Signed)
Patient Name: Tara Mahoney  MRN: [id number]  Epilepsy Attending: Charlsie Quest  Referring Physician/Provider: Catarina Hartshorn, MD  Date: 12/31/2021 Duration: 23.08 mins  Patient history: 36yo F with transient alteration of awareness. EEG to evaluate for seizure.  Level of alertness: Awake  AEDs during EEG study: TPM  Technical aspects: This EEG study was done with scalp electrodes positioned according to the 10-20 International system of electrode placement. Electrical activity was acquired at a sampling rate of 500Hz  and reviewed with a high frequency filter of 70Hz  and a low frequency filter of 1Hz . EEG data were recorded continuously and digitally stored.   Description: The posterior dominant rhythm consists of 10 Hz activity of moderate voltage (25-35 uV) seen predominantly in posterior head regions, symmetric and reactive to eye opening and eye closing.  Hyperventilation and photic stimulation were not performed.     IMPRESSION: This study is within normal limits. No seizures or epileptiform discharges were seen throughout the recording.  Phelix Fudala 

## 2021-12-31 NOTE — Progress Notes (Signed)
DAILY PROGRESS NOTE   Patient Name: Tara Mahoney Date of Encounter: 12/31/2021 Cardiologist: None  Chief Complaint   No complaints  Patient Profile   Tara Mahoney is a 36 y.o. female with a hx of dysautonomia, nonsustained ventricular tachycardia and PVCs, mitral valve prolapse who is being seen today for the evaluation of syncope at the request of Kommor Madison, MD.  Subjective   No issues overnight- orthostatics were negative. No arrhythmias. QTC was normal on EKG at admission, therefore, suspicion of flecainide-related VT is low. Suspect this was likely more of a dysautonomic event - perhaps vasovagal syncope.  Objective   Vitals:   12/30/21 1730 12/30/21 1810 12/30/21 2108 12/31/21 0434  BP: 119/85 114/68 103/67 104/66  Pulse: 78 73 86 72  Resp: 18 18 18 18   Temp:  98.6 F (37 C) (!) 97.4 F (36.3 C) 98.1 F (36.7 C)  TempSrc:  Oral Oral   SpO2: 100% 99% 100% 99%  Weight:  58.3 kg    Height:  5\' 3"  (1.6 m)      Intake/Output Summary (Last 24 hours) at 12/31/2021 0846 Last data filed at 12/30/2021 1337 Gross per 24 hour  Intake 1000 ml  Output --  Net 1000 ml   Filed Weights   12/30/21 1030 12/30/21 1810  Weight: 54 kg 58.3 kg    Physical Exam   Deferred  Inpatient Medications    Scheduled Meds:  enoxaparin (LOVENOX) injection  40 mg Subcutaneous Q24H   feeding supplement  237 mL Oral BID BM   flecainide  50 mg Oral BID   FLUoxetine  20 mg Oral Daily   magnesium oxide  400 mg Oral Daily   simvastatin  10 mg Oral QHS   topiramate  100 mg Oral BID    Continuous Infusions:   PRN Meds: acetaminophen **OR** acetaminophen, ondansetron **OR** ondansetron (ZOFRAN) IV, SUMAtriptan   Labs   Results for orders placed or performed during the hospital encounter of 12/30/21 (from the past 48 hour(s))  Comprehensive metabolic panel     Status: Abnormal   Collection Time: 12/30/21 10:58 AM  Result Value Ref Range   Sodium 138 135 - 145 mmol/L    Potassium 4.0 3.5 - 5.1 mmol/L   Chloride 109 98 - 111 mmol/L   CO2 24 22 - 32 mmol/L   Glucose, Bld 95 70 - 99 mg/dL    Comment: Glucose reference range applies only to samples taken after fasting for at least 8 hours.   BUN 17 6 - 20 mg/dL   Creatinine, Ser 01/01/22 0.44 - 1.00 mg/dL   Calcium 9.1 8.9 - 01/01/22 mg/dL   Total Protein 7.0 6.5 - 8.1 g/dL   Albumin 4.1 3.5 - 5.0 g/dL   AST 16 15 - 41 U/L   ALT 14 0 - 44 U/L   Alkaline Phosphatase 33 (L) 38 - 126 U/L   Total Bilirubin 0.5 0.3 - 1.2 mg/dL   GFR, Estimated 5.85 27.7 mL/min    Comment: (NOTE) Calculated using the CKD-EPI Creatinine Equation (2021)    Anion gap 5 5 - 15    Comment: Performed at [facility], [address], [place], [address] [place]  CBC with Differential     Status: Abnormal   Collection Time: 12/30/21 10:58 AM  Result Value Ref Range   WBC 6.5 4.0 - 10.5 K/uL   RBC 4.15 3.87 - 5.11 MIL/uL   Hemoglobin 13.2 12.0 - 15.0 g/dL   HCT 35361 01/01/22 -  46.0 %   MCV 94.7 80.0 - 100.0 fL   MCH 31.8 26.0 - 34.0 pg   MCHC 33.6 30.0 - 36.0 g/dL   RDW 26.9 48.5 - 46.2 %   Platelets 132 (L) 150 - 400 K/uL   nRBC 0.0 0.0 - 0.2 %   Neutrophils Relative % 68 %   Neutro Abs 4.5 1.7 - 7.7 K/uL   Lymphocytes Relative 19 %   Lymphs Abs 1.2 0.7 - 4.0 K/uL   Monocytes Relative 6 %   Monocytes Absolute 0.4 0.1 - 1.0 K/uL   Eosinophils Relative 5 %   Eosinophils Absolute 0.3 0.0 - 0.5 K/uL   Basophils Relative 1 %   Basophils Absolute 0.0 0.0 - 0.1 K/uL   Immature Granulocytes 1 %   Abs Immature Granulocytes 0.04 0.00 - 0.07 K/uL    Comment: Performed at [facility], [address] [place], [place] [address]  Troponin I (High Sensitivity)     Status: None   Collection Time: 12/30/21 10:58 AM  Result Value Ref Range   Troponin I (High Sensitivity) 4 <18 ng/L    Comment: (NOTE) Elevated high sensitivity troponin I (hsTnI) values and significant  changes across serial measurements may suggest ACS but many other  chronic and  acute conditions are known to elevate hsTnI results.  Refer to the "Links" section for chest pain algorithms and additional  guidance. Performed at [facility], [address], [place], [place] [address]   TSH     Status: None   Collection Time: 12/30/21 10:59 AM  Result Value Ref Range   TSH 2.187 0.350 - 4.500 uIU/mL    Comment: Performed by a 3rd Generation assay with a functional sensitivity of <=0.01 uIU/mL. Performed at [facility], [address] [place], [place] [address]   Troponin I (High Sensitivity)     Status: None   Collection Time: 12/30/21 12:35 PM  Result Value Ref Range   Troponin I (High Sensitivity) 10 <18 ng/L    Comment: (NOTE) Elevated high sensitivity troponin I (hsTnI) values and significant  changes across serial measurements may suggest ACS but many other  chronic and acute conditions are known to elevate hsTnI results.  Refer to the "Links" section for chest pain algorithms and additional  guidance. Performed at [facility], [address], [place], [place] [address]   Ethanol     Status: None   Collection Time: 12/30/21  4:17 PM  Result Value Ref Range   Alcohol, Ethyl (B) <10 <10 mg/dL    Comment: (NOTE) Lowest detectable limit for serum alcohol is 10 mg/dL.  For medical purposes only. Performed at [facility], [address], [place], [place] [address]   Magnesium     Status: None   Collection Time: 12/30/21  7:24 PM  Result Value Ref Range   Magnesium 2.1 1.7 - 2.4 mg/dL    Comment: Performed at [facility], [address], [place], [place] [address]    ECG   N/A  Telemetry   Normal sinus rhythm - Personally Reviewed  Radiology    DG Chest 2 View  Result Date: 12/30/2021 CLINICAL DATA:  Ventricular tachycardia EXAM: CHEST - 2 VIEW COMPARISON:  None Available. FINDINGS: The heart size and mediastinal contours are within normal limits. Both lungs are clear. The visualized skeletal structures are unremarkable. IMPRESSION: No active  cardiopulmonary disease. Electronically Signed   By: Helyn Numbers M.D.   On: 12/30/2021 23:44   MR BRAIN WO CONTRAST  Result Date: 12/30/2021 CLINICAL DATA:  Provided history: Seizure, new onset, no history of trauma. Head CT 08/13/2020. EXAM: MRI HEAD WITHOUT CONTRAST TECHNIQUE: Multiplanar, multiecho pulse sequences of the brain and surrounding structures were obtained without intravenous contrast. COMPARISON:  None Available. FINDINGS: Brain: Cerebral volume is normal. 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 No cortical encephalomalacia is identified. No significant cerebral white matter disease. No appreciable hippocampal size or signal asymmetry. There is no acute infarct. No evidence of an intracranial mass. No chronic intracranial blood products. No extra-axial fluid collection. No midline shift. Vascular: Maintained flow voids within the proximal large arterial vessels. Skull and upper cervical spine: No focal suspicious marrow lesion. Sinuses/Orbits: No mass or acute finding within the imaged orbits. Mild mucosal thickening within the bilateral ethmoid and maxillary sinuses. Other: Trace fluid within the left mastoid air cells. IMPRESSION: 1. No evidence of acute intracranial abnormality. 2. No specific seizure focus is identified. 3. 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 4. Otherwise unremarkable non-contrast MRI appearance of the brain. 5. Mild mucosal thickening within the bilateral ethmoid and maxillary sinuses. 6. Trace fluid within the left mastoid air cells. Electronically Signed   By: Jackey Loge D.O.   On: 12/30/2021 17:04    Cardiac Studies   N/A  Assessment   Principal Problem:   Syncope Active Problems:   Nonsustained ventricular tachycardia (HCC)   Autonomic dysfunction   Thrombocytopenia (HCC)   Plan   Syncope - suspect related to dysautonomia,  possibly vasovagal. QTc normal on flecainide, which is a low dose. She cannot tolerate BB. She will need to follow-up sooner with EP - may be a candidate for implanted loop recorder. They had previously entertained ablation, but she says Dr. Ladona Ridgel recommended against it. No further suggestions at this time. Suspect she can be discharged.  [facility] will sign off.   Medication Recommendations:  no changes Other recommendations (labs, testing, etc):  none Follow up as an outpatient:  Dr. Synthia Innocent   Time Spent Directly with Patient:  I have spent a total of 25 minutes with the patient reviewing hospital notes, telemetry, EKGs, labs and examining the patient as well as establishing an assessment and plan that was discussed personally with the patient.  > 50% of time was spent in direct patient care.  Length of Stay:  LOS: 0 days   Chrystie Nose, MD, [facility], FACP  [facility]  Medical Director of the [facility] Lipid [facility] &  Cardiovascular Risk Reduction Clinic Diplomate of the American Board of Clinical Lipidology Attending Cardiologist  Direct Dial: [phone]  Fax: [phone]  Website:  [email or web address] 12/31/2021, 8:46 AM

## 2022-01-01 ENCOUNTER — Encounter: Payer: Self-pay | Admitting: Neurology

## 2022-01-02 ENCOUNTER — Encounter (HOSPITAL_COMMUNITY): Payer: Self-pay | Admitting: Emergency Medicine

## 2022-01-02 ENCOUNTER — Other Ambulatory Visit: Payer: Self-pay

## 2022-01-02 ENCOUNTER — Inpatient Hospital Stay (HOSPITAL_COMMUNITY)
Admission: EM | Admit: 2022-01-02 | Discharge: 2022-01-06 | DRG: 880 | Disposition: A | Discharge status: Home / Self Care | Payer: BC Managed Care – PPO | Attending: Family Medicine | Admitting: Family Medicine

## 2022-01-02 ENCOUNTER — Observation Stay (HOSPITAL_COMMUNITY): Payer: BC Managed Care – PPO

## 2022-01-02 ENCOUNTER — Emergency Department (HOSPITAL_COMMUNITY): Payer: BC Managed Care – PPO

## 2022-01-02 DIAGNOSIS — Z888 Allergy status to other drugs, medicaments and biological substances status: Secondary | ICD-10-CM

## 2022-01-02 DIAGNOSIS — G90A Postural orthostatic tachycardia syndrome (POTS): Secondary | ICD-10-CM | POA: Diagnosis not present

## 2022-01-02 DIAGNOSIS — F4321 Adjustment disorder with depressed mood: Secondary | ICD-10-CM | POA: Diagnosis not present

## 2022-01-02 DIAGNOSIS — Z6822 Body mass index (BMI) 22.0-22.9, adult: Secondary | ICD-10-CM | POA: Diagnosis not present

## 2022-01-02 DIAGNOSIS — G43709 Chronic migraine without aura, not intractable, without status migrainosus: Secondary | ICD-10-CM | POA: Diagnosis present

## 2022-01-02 DIAGNOSIS — F419 Anxiety disorder, unspecified: Secondary | ICD-10-CM | POA: Diagnosis not present

## 2022-01-02 DIAGNOSIS — Z8249 Family history of ischemic heart disease and other diseases of the circulatory system: Secondary | ICD-10-CM | POA: Diagnosis not present

## 2022-01-02 DIAGNOSIS — Z87891 Personal history of nicotine dependence: Secondary | ICD-10-CM

## 2022-01-02 DIAGNOSIS — F445 Conversion disorder with seizures or convulsions: Secondary | ICD-10-CM | POA: Diagnosis not present

## 2022-01-02 DIAGNOSIS — Z79899 Other long term (current) drug therapy: Secondary | ICD-10-CM | POA: Diagnosis not present

## 2022-01-02 DIAGNOSIS — Z8673 Personal history of transient ischemic attack (TIA), and cerebral infarction without residual deficits: Secondary | ICD-10-CM

## 2022-01-02 DIAGNOSIS — I472 Ventricular tachycardia, unspecified: Secondary | ICD-10-CM | POA: Diagnosis not present

## 2022-01-02 DIAGNOSIS — Z885 Allergy status to narcotic agent status: Secondary | ICD-10-CM

## 2022-01-02 DIAGNOSIS — Z7982 Long term (current) use of aspirin: Secondary | ICD-10-CM

## 2022-01-02 DIAGNOSIS — E440 Moderate protein-calorie malnutrition: Secondary | ICD-10-CM | POA: Diagnosis present

## 2022-01-02 DIAGNOSIS — R569 Unspecified convulsions: Secondary | ICD-10-CM

## 2022-01-02 DIAGNOSIS — I471 Supraventricular tachycardia: Principal | ICD-10-CM

## 2022-01-02 DIAGNOSIS — I4729 Other ventricular tachycardia: Secondary | ICD-10-CM | POA: Diagnosis present

## 2022-01-02 DIAGNOSIS — R404 Transient alteration of awareness: Secondary | ICD-10-CM

## 2022-01-02 DIAGNOSIS — G43719 Chronic migraine without aura, intractable, without status migrainosus: Secondary | ICD-10-CM | POA: Diagnosis present

## 2022-01-02 DIAGNOSIS — Z90710 Acquired absence of both cervix and uterus: Secondary | ICD-10-CM | POA: Diagnosis not present

## 2022-01-02 DIAGNOSIS — E785 Hyperlipidemia, unspecified: Secondary | ICD-10-CM | POA: Diagnosis not present

## 2022-01-02 LAB — COMPREHENSIVE METABOLIC PANEL
ALT: 13 U/L (ref 0–44)
AST: 16 U/L (ref 15–41)
Albumin: 4.1 g/dL (ref 3.5–5.0)
Alkaline Phosphatase: 34 U/L — ABNORMAL LOW (ref 38–126)
Anion gap: 6 (ref 5–15)
BUN: 18 mg/dL (ref 6–20)
CO2: 21 mmol/L — ABNORMAL LOW (ref 22–32)
Calcium: 9.1 mg/dL (ref 8.9–10.3)
Chloride: 108 mmol/L (ref 98–111)
Creatinine, Ser: 0.94 mg/dL (ref 0.44–1.00)
GFR, Estimated: >60 mL/min (ref >60)
Glucose, Bld: 95 mg/dL (ref 70–99)
Potassium: 3.5 mmol/L (ref 3.5–5.1)
Sodium: 135 mmol/L (ref 135–145)
Total Bilirubin: 0.6 mg/dL (ref 0.3–1.2)
Total Protein: 7.2 g/dL (ref 6.5–8.1)

## 2022-01-02 LAB — URINALYSIS, ROUTINE W REFLEX MICROSCOPIC
Bilirubin Urine: NEGATIVE
Glucose, UA: NEGATIVE mg/dL
Hgb urine dipstick: NEGATIVE
Ketones, ur: 5 mg/dL — AB
Leukocytes,Ua: NEGATIVE
Nitrite: NEGATIVE
Protein, ur: NEGATIVE mg/dL
Specific Gravity, Urine: >1.046 — ABNORMAL HIGH (ref 1.005–1.030)
pH: 8 (ref 5.0–8.0)

## 2022-01-02 LAB — CBC WITH DIFFERENTIAL/PLATELET
Abs Immature Granulocytes: 0.02 10*3/uL (ref 0.00–0.07)
Basophils Absolute: 0 10*3/uL (ref 0.0–0.1)
Basophils Relative: 0 %
Eosinophils Absolute: 0.2 10*3/uL (ref 0.0–0.5)
Eosinophils Relative: 2 %
HCT: 41.5 % (ref 36.0–46.0)
Hemoglobin: 14.3 g/dL (ref 12.0–15.0)
Immature Granulocytes: 0 %
Lymphocytes Relative: 14 %
Lymphs Abs: 1.1 10*3/uL (ref 0.7–4.0)
MCH: 31.6 pg (ref 26.0–34.0)
MCHC: 34.5 g/dL (ref 30.0–36.0)
MCV: 91.6 fL (ref 80.0–100.0)
Monocytes Absolute: 0.3 10*3/uL (ref 0.1–1.0)
Monocytes Relative: 4 %
Neutro Abs: 6.6 10*3/uL (ref 1.7–7.7)
Neutrophils Relative %: 80 %
Platelets: 164 10*3/uL (ref 150–400)
RBC: 4.53 MIL/uL (ref 3.87–5.11)
RDW: 12.1 % (ref 11.5–15.5)
WBC: 8.3 10*3/uL (ref 4.0–10.5)
nRBC: 0 % (ref 0.0–0.2)

## 2022-01-02 LAB — RAPID URINE DRUG SCREEN, HOSP PERFORMED
Amphetamines: NOT DETECTED
Barbiturates: NOT DETECTED
Benzodiazepines: NOT DETECTED
Cocaine: NOT DETECTED
Opiates: NOT DETECTED
Tetrahydrocannabinol: NOT DETECTED

## 2022-01-02 LAB — MAGNESIUM: Magnesium: 2 mg/dL (ref 1.7–2.4)

## 2022-01-02 LAB — CBG MONITORING, ED: Glucose-Capillary: 89 mg/dL (ref 70–99)

## 2022-01-02 MED ORDER — FLUOXETINE HCL 20 MG PO CAPS
20.0000 mg | ORAL_CAPSULE | Freq: Every day | ORAL | Status: DC
  Administered 2022-01-03 – 2022-01-06 (×4): 20 mg via ORAL
  Filled 2022-01-02 (×4): qty 1

## 2022-01-02 MED ORDER — SODIUM CHLORIDE 0.9 % IV SOLN
75.0000 mL/h | INTRAVENOUS | Status: DC
  Administered 2022-01-02 – 2022-01-04 (×5): 75 mL/h via INTRAVENOUS

## 2022-01-02 MED ORDER — IOHEXOL 350 MG/ML SOLN
75.0000 mL | Freq: Once | INTRAVENOUS | Status: AC | PRN
  Administered 2022-01-02 (×2): 75 mL via INTRAVENOUS

## 2022-01-02 MED ORDER — CLONIDINE HCL 0.1 MG/24HR TD PTWK
0.1000 mg | MEDICATED_PATCH | TRANSDERMAL | Status: DC
  Administered 2022-01-03: 0.1 mg via TRANSDERMAL
  Filled 2022-01-02 (×3): qty 1

## 2022-01-02 MED ORDER — LORAZEPAM 2 MG/ML IJ SOLN: 1.0000 mg | Freq: Once | INTRAMUSCULAR | Status: DC

## 2022-01-02 MED ORDER — IOHEXOL 350 MG/ML SOLN: 100.0000 mL | Freq: Once | INTRAVENOUS | Status: DC | PRN

## 2022-01-02 MED ORDER — LORAZEPAM 2 MG/ML IJ SOLN
INTRAMUSCULAR | Status: AC
  Filled 2022-01-02: qty 1

## 2022-01-02 MED ORDER — LORAZEPAM 2 MG/ML IJ SOLN
4.0000 mg | INTRAMUSCULAR | Status: DC | PRN
  Administered 2022-01-04: 4 mg via INTRAVENOUS
  Filled 2022-01-02 (×3): qty 2

## 2022-01-02 MED ORDER — SUMATRIPTAN SUCCINATE 100 MG PO TABS
100.0000 mg | ORAL_TABLET | ORAL | Status: DC | PRN
  Administered 2022-01-03 – 2022-01-06 (×2): 100 mg via ORAL
  Filled 2022-01-02 (×4): qty 1

## 2022-01-02 MED ORDER — TOPIRAMATE 25 MG PO TABS
100.0000 mg | ORAL_TABLET | Freq: Two times a day (BID) | ORAL | Status: DC
  Administered 2022-01-02 – 2022-01-06 (×7): 100 mg via ORAL
  Filled 2022-01-02 (×7): qty 4

## 2022-01-02 MED ORDER — ORAL CARE MOUTH RINSE: 15.0000 mL | OROMUCOSAL | Status: DC

## 2022-01-02 MED ORDER — ENOXAPARIN SODIUM 40 MG/0.4ML IJ SOSY
40.0000 mg | PREFILLED_SYRINGE | INTRAMUSCULAR | Status: DC
Start: 2022-01-02 — End: 2022-01-06
  Administered 2022-01-02 – 2022-01-05 (×4): 40 mg via SUBCUTANEOUS
  Filled 2022-01-02 (×4): qty 0.4

## 2022-01-02 MED ORDER — ASPIRIN 81 MG PO CHEW
81.0000 mg | CHEWABLE_TABLET | Freq: Every day | ORAL | Status: DC
Start: 2022-01-02 — End: 2022-01-03
  Administered 2022-01-02: 81 mg via ORAL
  Filled 2022-01-02: qty 1

## 2022-01-02 MED ORDER — ORAL CARE MOUTH RINSE
15.0000 mL | OROMUCOSAL | Status: DC | PRN
Start: 2022-01-02 — End: 2022-01-02

## 2022-01-02 MED ORDER — SIMVASTATIN 20 MG PO TABS
10.0000 mg | ORAL_TABLET | Freq: Every day | ORAL | Status: DC
  Administered 2022-01-02 – 2022-01-05 (×4): 10 mg via ORAL
  Filled 2022-01-02 (×4): qty 1

## 2022-01-02 MED ORDER — CLONIDINE HCL 0.1 MG/24HR TD PTWK: 0.1000 mg | MEDICATED_PATCH | TRANSDERMAL | Status: DC

## 2022-01-02 MED ORDER — MAGNESIUM OXIDE -MG SUPPLEMENT 400 (240 MG) MG PO TABS
400.0000 mg | ORAL_TABLET | Freq: Every day | ORAL | Status: DC
  Administered 2022-01-02 – 2022-01-06 (×4): 400 mg via ORAL
  Filled 2022-01-02 (×4): qty 1

## 2022-01-02 MED ORDER — LEVETIRACETAM IN NACL 1500 MG/100ML IV SOLN
1500.0000 mg | Freq: Once | INTRAVENOUS | Status: AC
  Administered 2022-01-02: 1500 mg via INTRAVENOUS
  Filled 2022-01-02: qty 100

## 2022-01-02 MED ORDER — FLECAINIDE ACETATE 50 MG PO TABS
50.0000 mg | ORAL_TABLET | Freq: Two times a day (BID) | ORAL | Status: DC
  Administered 2022-01-02 – 2022-01-06 (×8): 50 mg via ORAL
  Filled 2022-01-02 (×9): qty 1

## 2022-01-02 NOTE — ED Notes (Signed)
Purple man not placed I called the nurse on 4e they were busy will get to it as soon as  he can

## 2022-01-02 NOTE — ED Triage Notes (Signed)
Per EMS around 830 Pt was talking to husband when all of a sudden started shaking and not talking. Pt was seen here yesterday for same. Pt HR was 130-150 by EMS. Pt is not speaking but answering questing by nodding head. Pt has history of SVT and afib.

## 2022-01-02 NOTE — ED Notes (Signed)
Pure wick placed.

## 2022-01-02 NOTE — ED Notes (Signed)
Did ekg shown to er provider patient is resting with call bell in reach 

## 2022-01-02 NOTE — ED Notes (Signed)
Report given to Michael at [facility] 

## 2022-01-02 NOTE — ED Notes (Signed)
The pts husband has arrived the pts has started talking she has has simiilar episodes of not being able to speak for 3-4 days  she was at [employer] [facility] this am and was transferred here  from [place] iv in her lt arm initially the pt made eye contact but did not talk followed later by a whispering when she talked

## 2022-01-02 NOTE — ED Notes (Signed)
[facility] onsite to transport to [facility]

## 2022-01-02 NOTE — H&P (Signed)
History and Physical    Tara Mahoney [id number] DOB: 01/16/1986 DOA: 01/02/2022  PCP: Alanson Puls, [facility] (Confirm with patient/family/NH records and if not entered, this has to be entered at [facility] point of entry) Patient coming from: Home  I have personally briefly reviewed patient's old medical records in [place]  Chief Complaint: Sleepy  HPI: Tara Mahoney is a 36 y.o. female with medical history significant of PSVT, intractable migraines, HLD, presented with new onset of seizure.  Patient not talking but only answer questions with nodding or shaking head.  This morning, family member found patient suddenly became unresponsive along with whole body shaking and EMS called.  EMS arrived found patient very tachycardia heart rate 130s.  And patient was shifted to [facility], ED.  There was a witnessed tonic-clonic seizure in the [facility] ED, Ativan x1 was given and seizure broke.  Patient remained awake however not talking.  Brain MRI negative for acute findings.  Patient denies any tongue biting or urine stool incontinence.   Review of Systems: Unable to perform, patient not talking.  Past Medical History:  Diagnosis Date   Blood transfusion without reported diagnosis    Complication of anesthesia    Dysautonomia ([place])    sinus arrythmia   Family history of anesthesia complication    PONV   [id number])    Heart murmur    MVP (mitral valve prolapse)    PONV (postoperative nausea and vomiting)    Ventricular tachycardia, nonsustained [facility])     Past Surgical History:  Procedure Laterality Date   ABDOMINAL HYSTERECTOMY     CERVICAL CERCLAGE  2008   CERVICAL CERCLAGE  09/08/2011   Procedure: CERCLAGE CERVICAL;  Surgeon: Florian Buff, MD;  Location: [facility];  Service: Gynecology;  Laterality: N/A;  [name],CST-scrubbed; doctor scrubbed in @ [place]  08/09/2012   Procedure: LAPAROSCOPIC LYSIS OF ADHESIONS;  Surgeon: Florian Buff, MD;  Location: [facility];  Service: Gynecology;  Laterality: N/A;   LAPAROSCOPY  08/09/2012   Procedure: LAPAROSCOPY DIAGNOSTIC;  Surgeon: Florian Buff, MD;  Location: [facility];  Service: Gynecology;  Laterality: N/A;  started at  1208-Vaginal trachelectomy (vaginal removal of cervical stump)    MYRINGOTOMY     SUPRACERVICAL ABDOMINAL HYSTERECTOMY  01/26/2012   Procedure: HYSTERECTOMY SUPRACERVICAL ABDOMINAL;  Surgeon: Florian Buff, MD;  Location: [place] [facility];  Service: Gynecology;;  converted [phone]     reports that she quit smoking about 12 years ago. Her smoking use included cigarettes. She started smoking about 20 years ago. She has never been exposed to tobacco smoke. She has never used smokeless tobacco. She reports that she does not drink alcohol and does not use drugs.  Allergies  Allergen Reactions   Carvedilol Hives and Itching   Definity [Perflutren Lipid Microsphere] Other (See Comments)    Headache   Hydrocodone-Acetaminophen Itching and Nausea And Vomiting   Morphine And Related Nausea And Vomiting   Verapamil     Racing heart, dizzyness, headache,shaking all over her body    Atenolol Rash and Other (See Comments)   Cardizem Cd [Diltiazem Hcl Er Beads] Rash   Metoprolol Rash    Headaches, dizzyness   Nebivolol Rash    Family History  Problem Relation Age of Onset   Hypertension Brother    Heart disease Brother        hole in heart   Ovarian cancer Mother    Anesthesia problems Neg Hx  Hypotension Neg Hx    Pseudochol deficiency Neg Hx    Malignant hyperthermia Neg Hx    Other Neg Hx      Prior to Admission medications   Medication Sig Start Date End Date Taking? Authorizing Provider  aspirin 81 MG chewable tablet Chew 1 tablet (81 mg total) by mouth daily. 01/01/22   Catarina Hartshorn, MD  cloNIDine (CATAPRES - DOSED IN MG/24 HR) 0.1 mg/24hr patch Place 1 patch (0.1 mg total) onto the skin once a week. 10/14/21   Marinus Maw, MD  flecainide (TAMBOCOR) 50 MG tablet  Take 1 tablet (50 mg total) by mouth 2 (two) times daily. 09/22/21   Duke Salvia, MD  FLUoxetine (PROZAC) 20 MG capsule TAKE 1 CAPSULE BY MOUTH EVERY DAY 10/15/21   Rodolph Bong, MD  Magnesium Oxide 400 MG CAPS Take 1 capsule (400 mg total) by mouth daily. 08/21/21   Duke Salvia, MD  Multiple Vitamin (MULTIVITAMIN WITH MINERALS) TABS tablet Take 1 tablet by mouth daily.    [provider]  NON FORMULARY Take 1 tablet by mouth daily. OTC Salt Tablet    [provider]  simvastatin (ZOCOR) 10 MG tablet Take 10 mg by mouth at bedtime. 10/01/21   [provider]  SUMAtriptan (IMITREX) 50 MG tablet Take 2 tablets (100 mg total) by mouth every 2 (two) hours as needed for migraine. May repeat in 2 hours if headache persists or recurs.  Max dose 200 mg/day 09/16/21   Rodolph Bong, MD  topiramate (TOPAMAX) 100 MG tablet Take 1 tablet (100 mg total) by mouth 2 (two) times daily. 09/16/21   Rodolph Bong, MD    Physical Exam: Vitals:   01/02/22 1330 01/02/22 1345 01/02/22 1439 01/02/22 1530  BP: 120/76  115/74 120/89  Pulse: (!) 119 (!) 111 (!) 105 (!) 113  Resp: 18  18 15   Temp:   97.9 F (36.6 C)   TempSrc:   Oral   SpO2: 100% 100% 100% 100%  Weight:      Height:        Constitutional: NAD, calm, comfortable Vitals:   01/02/22 1330 01/02/22 1345 01/02/22 1439 01/02/22 1530  BP: 120/76  115/74 120/89  Pulse: (!) 119 (!) 111 (!) 105 (!) 113  Resp: 18  18 15   Temp:   97.9 F (36.6 C)   TempSrc:   Oral   SpO2: 100% 100% 100% 100%  Weight:      Height:       Eyes: PERRL, lids and conjunctivae normal ENMT: Mucous membranes are moist. Posterior pharynx clear of any exudate or lesions.Normal dentition.  Neck: normal, supple, no masses, no thyromegaly Respiratory: clear to auscultation bilaterally, no wheezing, no crackles. Normal respiratory effort. No accessory muscle use.  Cardiovascular: Regular rate and rhythm, no murmurs / rubs / gallops. No extremity edema. 2+  pedal pulses. No carotid bruits.  Abdomen: no tenderness, no masses palpated. No hepatosplenomegaly. Bowel sounds positive.  Musculoskeletal: no clubbing / cyanosis. No joint deformity upper and lower extremities. Good ROM, no contractures. Normal muscle tone.  Skin: no rashes, lesions, ulcers. No induration Neurologic: No facial droops, moving all limbs, following simple commands Psychiatric: Awake, not talking    Labs on Admission: I have personally reviewed following labs and imaging studies  CBC: Recent Labs  Lab 12/30/21 1058 01/02/22 1056  WBC 6.5 8.3  NEUTROABS 4.5 6.6  HGB 13.2 14.3  HCT 39.3 41.5  MCV 94.7 91.6  PLT 132* 164   Basic Metabolic Panel: Recent Labs  Lab 12/30/21 1058 12/30/21 1924 01/02/22 1056  NA 138  --  135  K 4.0  --  3.5  CL 109  --  108  CO2 24  --  21*  GLUCOSE 95  --  95  BUN 17  --  18  CREATININE 0.97  --  0.94  CALCIUM 9.1  --  9.1  MG  --  2.1 2.0   GFR: Estimated Creatinine Clearance: 67.1 mL/min (by C-G formula based on SCr of 0.94 mg/dL). Liver Function Tests: Recent Labs  Lab 12/30/21 1058 01/02/22 1056  AST 16 16  ALT 14 13  ALKPHOS 33* 34*  BILITOT 0.5 0.6  PROT 7.0 7.2  ALBUMIN 4.1 4.1   No results for input(s): "LIPASE", "AMYLASE" in the last 168 hours. No results for input(s): "AMMONIA" in the last 168 hours. Coagulation Profile: No results for input(s): "INR", "PROTIME" in the last 168 hours. Cardiac Enzymes: Recent Labs  Lab 12/31/21 0837  CKTOTAL 39   BNP (last 3 results) No results for input(s): "PROBNP" in the last 8760 hours. HbA1C: No results for input(s): "HGBA1C" in the last 72 hours. CBG: Recent Labs  Lab 01/02/22 1029  GLUCAP 89   Lipid Profile: Recent Labs    12/31/21 0837  CHOL 129  HDL 35*  LDLCALC 77  TRIG 85  CHOLHDL 3.7   Thyroid Function Tests: No results for input(s): "TSH", "T4TOTAL", "FREET4", "T3FREE", "THYROIDAB" in the last 72 hours. Anemia Panel: Recent Labs     12/31/21 0837  VITAMINB12 332  FOLATE 28.1   Urine analysis:    Component Value Date/Time   COLORURINE YELLOW 01/02/2022 1321   APPEARANCEUR HAZY (A) 01/02/2022 1321   LABSPEC >1.046 (H) 01/02/2022 1321   PHURINE 8.0 01/02/2022 1321   GLUCOSEU NEGATIVE 01/02/2022 1321   HGBUR NEGATIVE 01/02/2022 1321   BILIRUBINUR NEGATIVE 01/02/2022 1321   KETONESUR 5 (A) 01/02/2022 1321   PROTEINUR NEGATIVE 01/02/2022 1321   UROBILINOGEN 0.2 01/16/2015 1409   NITRITE NEGATIVE 01/02/2022 1321   LEUKOCYTESUR NEGATIVE 01/02/2022 1321    Radiological Exams on Admission: CT ANGIO HEAD NECK W WO CM  Result Date: 01/02/2022 CLINICAL DATA:  36 year old female with recent seizure. 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 Unrevealing MRA. EXAM: CT ANGIOGRAPHY HEAD AND NECK TECHNIQUE: Multidetector CT imaging of the head and neck was performed using the standard protocol during bolus administration of intravenous contrast. Multiplanar CT image reconstructions and MIPs were obtained to evaluate the vascular anatomy. Carotid stenosis measurements (when applicable) are obtained utilizing NASCET criteria, using the distal internal carotid diameter as the denominator. RADIATION DOSE REDUCTION: This exam was performed according to the departmental dose-optimization program which includes automated exposure control, adjustment of the mA and/or kV according to patient size and/or use of iterative reconstruction technique. CONTRAST:  21mL OMNIPAQUE IOHEXOL 350 MG/ML SOLN COMPARISON:  MRA head and neck 12/31/2021. Head CT 08/13/2020. FINDINGS: CT HEAD Brain: Stable since January and negative noncontrast CT appearance of the brain. Small chronic left posterior cerebellar infarct essentially occult by CT (series 5, image 26). Calvarium and skull base: Negative. Paranasal sinuses: Visualized paranasal sinuses and mastoids are stable and well aerated. Orbits: Leftward gaze, otherwise negative orbits and scalp. CTA NECK  Skeleton: Reversal of the normal cervical lordosis, but otherwise negative. No acute osseous abnormality identified. Upper chest: Negative. Other neck: Negative. Aortic arch: Negative. Three vessel arch configuration. Right carotid system: Negative. Left carotid  system: Proximal left CCA partially obscured by dense adjacent venous contrast streak artifact. Otherwise negative. Vertebral arteries: Negative; some proximal vertebral artery detail mildly obscured by dense adjacent venous contrast reflux. CTA HEAD Posterior circulation: Codominant distal vertebral arteries appear normal to the vertebrobasilar junction. Patent PICA origins. Patent basilar artery without stenosis. Fetal type right PCA origin. Patent basilar tip, SCA and left PCA origins. Bilateral PCA branches are within normal limits. Anterior circulation: Both ICA siphons are patent without stenosis. Normal right posterior communicating artery origin. Patent carotid termini, MCA and ACA origins. Diminutive or absent anterior communicating artery. Bilateral ACA branches are within normal limits. Left MCA M1 segment and bifurcation are patent without stenosis. Right MCA M1 segment and bifurcation are patent without stenosis. Bilateral MCA branches are within normal limits. Venous sinuses: Patent. Anatomic variants: Fetal type right PCA origin. Review of the MIP images confirms the above findings IMPRESSION: 1. Normal CTA Head and Neck. 2.  No acute intracranial abnormality. Electronically Signed   By: Genevie Ann M.D.   On: 01/02/2022 12:19   EEG adult  Result Date: 12/31/2021 Lora Havens, MD     12/31/2021  6:21 PM Patient Name: Tara Mahoney MRN: [id number] Epilepsy Attending: Lora Havens Referring Physician/Provider: Orson Eva, MD Date: 12/31/2021 Duration: 23.08 mins Patient history: 36yo F with transient alteration of awareness. EEG to evaluate for seizure. Level of alertness: Awake AEDs during EEG study: TPM Technical aspects: This EEG study  was done with scalp electrodes positioned according to the 10-20 International system of electrode placement. Electrical activity was acquired at a sampling rate of 500Hz  and reviewed with a high frequency filter of 70Hz  and a low frequency filter of 1Hz . EEG data were recorded continuously and digitally stored. Description: The posterior dominant rhythm consists of 10 Hz activity of moderate voltage (25-35 uV) seen predominantly in posterior head regions, symmetric and reactive to eye opening and eye closing. Hyperventilation and photic stimulation were not performed.   IMPRESSION: This study is within normal limits. No seizures or epileptiform discharges were seen throughout the recording. Tara Mahoney    EKG: Independently reviewed.  Sinus tachycardia  Assessment/Plan Principal Problem:   Seizure (HCC) Active Problems:   Intractable chronic migraine without aura and without status migrainosus  (please populate well all problems here in Problem List. (For example, if patient is on BP meds at home and you resume or decide to hold them, it is a problem that needs to be her. Same for CAD, COPD, HLD and so on)  Seizure, tonic-clonic -Neurology consultation appreciated -As per neurology recommendation we will hold off Keppra further doses -As needed Ativan -EEG underway -Other Ddx, patient was recently hospitalized for syncope episode, patient denied any prodromes of lightheadedness blurry vision at this time.  History of PSVT -Fairly controlled, patient does have extensive allergy to beta-blocker and calcium channel blocker and has been on a regimen of flecainide and clonidine regimen for rate control. -ASA  Migraines -Continue Topamax and as needed sumatriptan  DVT prophylaxis: Lovenox Code Status: Full code Family Communication: None at bedside Disposition Plan: Expect less than 2 midnight hospital stay Consults called: Neurology Admission status: Telemetry observation   Lequita Halt MD [facility] Pager [phone]  01/02/2022, 4:24 PM

## 2022-01-02 NOTE — ED Notes (Signed)
Admitting doctor at bedside  the pt will not talk to him either

## 2022-01-02 NOTE — ED Provider Notes (Signed)
Patient presents from [facility] emergency department.  See previous provider note note for further details. Briefly, 36 year old female with a past medical history of NSVT, autonomic dysfunction, migraine headaches, PVCs, and mitral valve disorder presented to the emergency department with a chief complaint of suspected seizure and tachycardia.  At approximately 830 patient was at home speaking with her husband when she had a episode of shaking and stopped talking.  Upon EMS arrival patient had tachycardia with heart rate in the 130s to 150s.  Patient was not conversive but able to answer questions with head shakes.  Upon initial assessment by previous provider patient was not responsive and was clenching her jaw and squeezing her father's hand who is at bedside.  A few minutes into the episode she started shaking her arms and not responding.  Patient had tachycardia with heart rate into the 140s after this episode.  Patient received Ativan after which she awoke was able to give further history.   Physical Exam  BP 115/74 (BP Location: Right Arm)   Pulse (!) 105   Temp 97.9 F (36.6 C) (Oral)   Resp 18   Ht 5\' 3"  (1.6 m)   Wt 58.3 kg   LMP 05/04/2011   SpO2 100%   BMI 22.77 kg/m   Physical Exam Vitals and nursing note reviewed.  Constitutional:      General: She is not in acute distress.    Appearance: She is not ill-appearing, toxic-appearing or diaphoretic.  HENT:     Head: Normocephalic.  Eyes:     General: No scleral icterus.       Right eye: No discharge.        Left eye: No discharge.  Cardiovascular:     Rate and Rhythm: Normal rate.  Pulmonary:     Effort: Pulmonary effort is normal.  Skin:    General: Skin is warm and dry.  Neurological:     General: No focal deficit present.     Mental Status: She is alert.     GCS: GCS eye subscore is 2. GCS verbal subscore is 5. GCS motor subscore is 6.  Psychiatric:        Behavior: Behavior is cooperative.     Procedures   Procedures  ED Course / MDM   Clinical Course as of 01/02/22 1444  Sat Jan 02, 2022  1054 Called back into the room at which time has been states that she can currently speak although she is awake and is responding to questions with head nodding.  She has had no further seizure-like activity. [JI]    Clinical Course User Index [JI] Jan 04, 2022, PA-C   Medical Decision Making Amount and/or Complexity of Data Reviewed Labs: ordered. Radiology: ordered.  Risk Prescription drug management. Decision regarding hospitalization.  Previous provider consulted neurology and spoke with Dr. Burgess Amor who recommended CT head and neck after which patient was to be transferred to [facility] ED for neurology assessment and 24-hour EEG follow-up.  On my exam patient is resting comfortably with her eyes closed.  Patient answers questions with yes or no head shakes.  He is able to follow simple commands.  Patient is not conversive at this time however alert  1445 I spoke with on-call neurologist Dr. [facility].  Who will come to evaluate the patient at bedside.  Requested patient be admitted to hospitalist service.  I spoke with Dr. Thomasena Edis who will see the patient for admission.    Chipper Herb, Haskel Schroeder 01/02/22 1558  Arby Barrette, MD 01/15/22 [phone]

## 2022-01-02 NOTE — Plan of Care (Signed)
Neurology Plan of Care  Tara Mahoney MR# [id number] 01/02/2022   Attempted to see patient however patient was awake but uncooperative with history and exam.    Please see Dr. Rollene Fare Neurology Brief no charge phone consultation 01/02/2022 10:48 AM  Taken from note:  "...36 year old woman with complex medical history as above presenting with recurrent episodes of shaking followed by persistent speech arrest but able to follow commands and appropriately answer yes/no questions.  Events as described do not clearly sound epileptic to me.  However given her frequent presentations and significant distress, recommend transfer to [facility] for in person neurological evaluation, MRI brain (which is not available at [facility] today), as well as long-term EEG for spell capture.  Given her cardiac history and possible left cerebellar stroke versus artifact, will also obtain CTA while at [facility] to rule out an acute process that could require intervention"   Recommendations: - Discontinue Keppra due to low concern for ongoing seizure activity based on spell description and to allow diagnostic certainty of EEG monitoring, as well as given patient's stability - If there are further events, attempts to abort events with salient stimuli such as saline flush applied to the eyes and assess for patient's responsiveness. - CTA head and neck stat - Ordered. - MRI brain without contrast - Ordered. - cEEG for spell capture and characterization -If patient begins to experience significant hemodynamic instability with further episodes such as hypotension or severe hypertension, desaturation, administer Ativan 1 mg and reach out to neurology   Electronically signed by:  Marisue Humble, MD Page: [phone] 01/02/2022, 3:54 PM

## 2022-01-02 NOTE — ED Provider Notes (Signed)
[facility] EMERGENCY DEPARTMENT Provider Note   CSN: [id number] Arrival date & time: 01/02/22  8841     History  Chief Complaint  Patient presents with   Tachycardia    Tara Mahoney is a 36 y.o. female with a history significant for NSVT, autonomic dysfunction, history of migraine headaches, PVCs and mitral valve disorder presenting for evaluation of suspected seizure events in association with tachycardia.  Around 830 this morning she was at home, simply speaking with her husband when she suddenly started staring,  shaking and stopped talking.  Per EMS her heart rate was in the 130s to 150s.  Upon arrival she was awake but not conversive, was nodding her head to answer questions per triage RN.  Upon my first arrival in the room she was not responsive but was clenching her jaw and squeezing her father's hand who is standing at the bedside.  A few minutes into her visit she had a brief episode where she was shaking her arms and again not responding.  During this episode her heart rate escalated to 140, after this event which lasted less than 2 minutes she was once again not responsive, suspected postictal, her heart rate returned to 100-110 range.  Pt was given IV ativan after which she woke and was able to give further history, stating she was feeling well this am, sitting on the sofa when her heart started racing after which she could not respond and lost consciousness.  She denies having chest pain but did feel sob.   The history is provided by medical records and a relative.       Home Medications Prior to Admission medications   Medication Sig Start Date End Date Taking? Authorizing Provider  aspirin 81 MG chewable tablet Chew 1 tablet (81 mg total) by mouth daily. 01/01/22   Catarina Hartshorn, MD  cloNIDine (CATAPRES - DOSED IN MG/24 HR) 0.1 mg/24hr patch Place 1 patch (0.1 mg total) onto the skin once a week. 10/14/21   Marinus Maw, MD  flecainide (TAMBOCOR) 50 MG tablet Take 1 tablet  (50 mg total) by mouth 2 (two) times daily. 09/22/21   Duke Salvia, MD  FLUoxetine (PROZAC) 20 MG capsule TAKE 1 CAPSULE BY MOUTH EVERY DAY 10/15/21   Rodolph Bong, MD  Magnesium Oxide 400 MG CAPS Take 1 capsule (400 mg total) by mouth daily. 08/21/21   Duke Salvia, MD  Multiple Vitamin (MULTIVITAMIN WITH MINERALS) TABS tablet Take 1 tablet by mouth daily.    [provider]  NON FORMULARY Take 1 tablet by mouth daily. OTC Salt Tablet    [provider]  simvastatin (ZOCOR) 10 MG tablet Take 10 mg by mouth at bedtime. 10/01/21   [provider]  SUMAtriptan (IMITREX) 50 MG tablet Take 2 tablets (100 mg total) by mouth every 2 (two) hours as needed for migraine. May repeat in 2 hours if headache persists or recurs.  Max dose 200 mg/day 09/16/21   Rodolph Bong, MD  topiramate (TOPAMAX) 100 MG tablet Take 1 tablet (100 mg total) by mouth 2 (two) times daily. 09/16/21   Rodolph Bong, MD      Allergies    Carvedilol, Definity [perflutren lipid microsphere], Hydrocodone-acetaminophen, Morphine and related, Verapamil, Atenolol, Cardizem cd [diltiazem hcl er beads], Metoprolol, and Nebivolol    Review of Systems   Review of Systems  Unable to perform ROS: Patient nonverbal    Physical Exam Updated Vital Signs BP 119/72  Pulse (!) 115   Temp 98.5 F (36.9 C) (Oral)   Resp 19   Ht 5\' 3"  (1.6 m)   Wt 58.3 kg   LMP 05/04/2011   SpO2 100%   BMI 22.77 kg/m  Physical Exam Vitals and nursing note reviewed.  Constitutional:      Appearance: She is well-developed.  HENT:     Head: Normocephalic and atraumatic.  Eyes:     Conjunctiva/sclera: Conjunctivae normal.  Cardiovascular:     Rate and Rhythm: Normal rate and regular rhythm.     Heart sounds: Normal heart sounds.  Pulmonary:     Effort: Pulmonary effort is normal.     Breath sounds: Normal breath sounds. No wheezing.  Abdominal:     General: Bowel sounds are normal.     Palpations: Abdomen is soft.      Tenderness: There is no abdominal tenderness.  Musculoskeletal:        General: Normal range of motion.     Cervical back: Normal range of motion.  Skin:    General: Skin is warm and dry.  Neurological:     Motor: Motor function is intact.     Comments: Is able to move all extremities.  No pronator drift, equal grip strength.  No numbness in her extremities.  No facial droop.     ED Results / Procedures / Treatments   Labs (all labs ordered are listed, but only abnormal results are displayed) Labs Reviewed  COMPREHENSIVE METABOLIC PANEL - Abnormal; Notable for the following components:      Result Value   CO2 21 (*)    Alkaline Phosphatase 34 (*)    All other components within normal limits  CBC WITH DIFFERENTIAL/PLATELET  MAGNESIUM  RAPID URINE DRUG SCREEN, HOSP PERFORMED  URINALYSIS, ROUTINE W REFLEX MICROSCOPIC  CBG MONITORING, ED  CBG MONITORING, ED  POC URINE PREG, ED    EKG None  Radiology CT ANGIO HEAD NECK W WO CM  Result Date: 01/02/2022 CLINICAL DATA:  36 year old female with recent seizure. 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 Unrevealing MRA. EXAM: CT ANGIOGRAPHY HEAD AND NECK TECHNIQUE: Multidetector CT imaging of the head and neck was performed using the standard protocol during bolus administration of intravenous contrast. Multiplanar CT image reconstructions and MIPs were obtained to evaluate the vascular anatomy. Carotid stenosis measurements (when applicable) are obtained utilizing NASCET criteria, using the distal internal carotid diameter as the denominator. RADIATION DOSE REDUCTION: This exam was performed according to the departmental dose-optimization program which includes automated exposure control, adjustment of the mA and/or kV according to patient size and/or use of iterative reconstruction technique. CONTRAST:  82mL OMNIPAQUE IOHEXOL 350 MG/ML SOLN COMPARISON:  MRA head and neck 12/31/2021. Head CT 08/13/2020. FINDINGS: CT HEAD Brain:  Stable since January and negative noncontrast CT appearance of the brain. Small chronic left posterior cerebellar infarct essentially occult by CT (series 5, image 26). Calvarium and skull base: Negative. Paranasal sinuses: Visualized paranasal sinuses and mastoids are stable and well aerated. Orbits: Leftward gaze, otherwise negative orbits and scalp. CTA NECK Skeleton: Reversal of the normal cervical lordosis, but otherwise negative. No acute osseous abnormality identified. Upper chest: Negative. Other neck: Negative. Aortic arch: Negative. Three vessel arch configuration. Right carotid system: Negative. Left carotid system: Proximal left CCA partially obscured by dense adjacent venous contrast streak artifact. Otherwise negative. Vertebral arteries: Negative; some proximal vertebral artery detail mildly obscured by dense adjacent venous contrast reflux. CTA HEAD Posterior circulation: Codominant distal vertebral arteries  appear normal to the vertebrobasilar junction. Patent PICA origins. Patent basilar artery without stenosis. Fetal type right PCA origin. Patent basilar tip, SCA and left PCA origins. Bilateral PCA branches are within normal limits. Anterior circulation: Both ICA siphons are patent without stenosis. Normal right posterior communicating artery origin. Patent carotid termini, MCA and ACA origins. Diminutive or absent anterior communicating artery. Bilateral ACA branches are within normal limits. Left MCA M1 segment and bifurcation are patent without stenosis. Right MCA M1 segment and bifurcation are patent without stenosis. Bilateral MCA branches are within normal limits. Venous sinuses: Patent. Anatomic variants: Fetal type right PCA origin. Review of the MIP images confirms the above findings IMPRESSION: 1. Normal CTA Head and Neck. 2.  No acute intracranial abnormality. Electronically Signed   By: Odessa Fleming M.D.   On: 01/02/2022 12:19   EEG adult  Result Date: 12/31/2021 Charlsie Quest, MD      12/31/2021  6:21 PM Patient Name: Kimberl Vig MRN: [id number] Epilepsy Attending: Charlsie Quest Referring Physician/Provider: Catarina Hartshorn, MD Date: 12/31/2021 Duration: 23.08 mins Patient history: 36yo F with transient alteration of awareness. EEG to evaluate for seizure. Level of alertness: Awake AEDs during EEG study: TPM Technical aspects: This EEG study was done with scalp electrodes positioned according to the 10-20 International system of electrode placement. Electrical activity was acquired at a sampling rate of 500Hz  and reviewed with a high frequency filter of 70Hz  and a low frequency filter of 1Hz . EEG data were recorded continuously and digitally stored. Description: The posterior dominant rhythm consists of 10 Hz activity of moderate voltage (25-35 uV) seen predominantly in posterior head regions, symmetric and reactive to eye opening and eye closing. Hyperventilation and photic stimulation were not performed.   IMPRESSION: This study is within normal limits. No seizures or epileptiform discharges were seen throughout the recording. Priyanka    Procedures Procedures    Medications Ordered in ED Medications  LORazepam (ATIVAN) injection 1 mg ( Intravenous Not Given 01/02/22 1130)  levETIRAcetam (KEPPRA) IVPB 1500 mg/ 100 mL premix (0 mg Intravenous Stopped 01/02/22 1051)  iohexol (OMNIPAQUE) 350 MG/ML injection 75 mL (75 mLs Intravenous Contrast Given 01/02/22 1157)    ED Course/ Medical Decision Making/ A&P Clinical Course as of 01/02/22 1247  Sat Jan 02, 2022  1054 Called back into the room at which time has been states that she can currently speak although she is awake and is responding to questions with head nodding.  She has had no further seizure-like activity. [JI]    Clinical Course User Index [JI] 01/04/22, PA-C                           Medical Decision Making Patient with a known history of NSVT, also has POTS syndrome, mitral valve disorder, history of  migraine headaches having altered mental status intermittently which appears to be associated with episodes of SVT, she has had seizure-like activity which is transient with these episodes.  She is currently at baseline although fatigued.  Amount and/or Complexity of Data Reviewed Labs: ordered. Radiology: ordered.    Details: CTA head and neck negative for LVO. Discussion of management or test interpretation with external provider(s): Patient's current presentation and her recent hospitalization and past history discussed with Dr. 01/04/22 of neurology.  She has requested a CT a head and neck here after which she is asked in ED to ED transfer to [facility] where neurology can further  assess this patient and plan for 24-hour EEG.  She is also asked to discontinue the Keppra as at this point seizure as primary diagnosis is questionable, infusion has been stopped.  Advise Dr. Audley Hose of the ED of need for ED to ED transfer, he accepts patient.    Risk Prescription drug management.           Final Clinical Impression(s) / ED Diagnoses Final diagnoses:  SVT (supraventricular tachycardia) (HCC)  Transient alteration of awareness    Rx / DC Orders ED Discharge Orders     None         Victoriano Lain 01/02/22 1247    Benjiman Core, MD 01/03/22 1527

## 2022-01-02 NOTE — ED Notes (Signed)
Mother came to nurses station and stated that pt was having a seizure. Upon entering room pts hands and arms were shaking and heart rate was in 140s. Shaking lasted approx 2.5 minutes and when shaking stopped HR decreased. Pt would not open eyes but answered questions appropriately by nodding and would grip hand. EDP made aware.

## 2022-01-02 NOTE — Consult Note (Addendum)
Neurology Brief no charge phone consultation Reason for Consult: Spells of altered mental status Requesting Physician: PA Burgess Amor working with Dr. Benjiman Core  CC: Shaking episodes  History is obtained from: Burgess Amor and chart review  HPI: Tara Mahoney is a 36 y.o. female presenting with shaking spells and altered ability to speak, with a past medical history of palpitations followed by cardiology, mild to moderate mitral regurgitation, concern for POTS syndrome, chart reported atrial fibrillation but not documented in cardiology notes, remote subcentimeter asymptomatic left inferior cerebral stroke seen on MRI brain  Today she was speaking to her husband at 8:30 AM when she had behavioral arrest, started shaking, and stopped talking with heart rates in the 130s to 150s, and subsequently she is appropriately answering questions by shaking and nodding her head but not otherwise speaking.  Subsequently she had additional episodes in the ED where her jaw was clenched, she was gripping her family members and hand and she had a lip tremor with nonresponsiveness to stroking of her face, with heart rates from the 100s to 150s, blood pressure not recorded.  After 10 minutes of this activity she was given 1 mg of Ativan which resolved her symptoms and afterwards she had no postevent confusion.  But subsequently she began to have another event.  ED provider was in the process of loading with Keppra and called me for further recommendations.  She recently presented on 6/14 with similar symptoms.  At that time there was reported loss of consciousness with post loss of consciousness shaking, patient was unable to remember details of the event.  However she was able to report to EMS that she started to panic and cry, at which time her heart rate was in the 140s.  She was evaluated by cardiology, orthostatic vitals were negative, there were no arrhythmias on monitoring, QTc was normal on EKG and therefore  suspicion of flecainide related ventricular tachycardia was low and their primary consideration was for a dysautonomic event such as vasovagal syncope.  There is a recommendation for follow-up with EP and consideration of implanted loop recorder with a note that ablation had been previously entertained but recommended against.  She was additionally evaluated by Dr. Melynda Ripple via telemedicine.  Neurological work-up included MRI brain that demonstrated a possible chronic left inferior cerebellar infarct.  Her neurological exam was unremarkable.  A1c is 5.2%, LDL 62.  Routine EEG was normal.  Given the chronic left cerebellar infarct, MRA was also obtained of the head and neck, which demonstrated a fetal right PCA (normal anatomic variant) and was otherwise unremarkable.  Due to the cerebellar finding, she was started on aspirin 81 mg daily; her statin was continued.  Convulsive syncope was felt to be the most likely etiology of her shaking.  She follows with Dr. Hurman Horn of cardiology who last saw the patient on 12/17/2021 notes that the patient has been previously seen for PVCs and PACs with nonsustained ventricular tachycardia in the setting of a mild cardiomyopathy (resolved) with cardiac MRI concerned about noncompaction (not confirmed), modest benefit with flecainide.  She is also seen him for atypical chest pain for which she had an echocardiogram and event recorder (a Zio patch 09/2021) which revealed patient triggered events that were sinus rhythm associated with pain with some variation in heart rate.  She has had a history of mitral regurgitation mild to moderate noted to be eccentric.  Her last echocardiogram in March 2023 demonstrated normal LV function and moderate mitral valve regurgitation,  otherwise unremarkable  She has had lightheadedness which improved on clonidine and was noted to have low salt intake of only 200 mg/day, and encouraged to increase her salt intake to 2 g/day  She is scheduled  for outpatient neurology follow-up with Dr. Patrcia Dolly at [facility] neurology on 01/22/2022 at 9 AM  I have reviewed labs in epic and the results pertinent to this consultation are: Glucose 89 Remainder of her labs (CBC, CMP, magnesium, UA, UDS) pending at this time  I have reviewed the images obtained:  MRI brain without contrast 6/14 personally reviewed  1. No evidence of acute intracranial abnormality. 2. No specific seizure focus is identified. 3. 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 [On my review query whether this could be an artifactual finding] 4. Otherwise unremarkable non-contrast MRI appearance of the brain. 5. Mild mucosal thickening within the bilateral ethmoid and maxillary sinuses. 6. Trace fluid within the left mastoid air cells.   MRA head and neck 12/31/2021 personally reviewed Agree with radiology, these are both negative studies  Impression: 36 year old woman with complex medical history as above presenting with recurrent episodes of shaking followed by persistent speech arrest but able to follow commands and appropriately answer yes/no questions.  Events as described do not clearly sound epileptic to me.  However given her frequent presentations and significant distress, recommend transfer to [facility] for in person neurological evaluation, MRI brain (which is not available at [facility] today), as well as long-term EEG for spell capture.  Given her cardiac history and possible left cerebellar stroke versus artifact, will also obtain CTA while at [facility] to rule out an acute process that could require intervention  Recommendations: -Discontinue Keppra due to low concern for ongoing seizure activity based on spell description and to allow diagnostic certainty of EEG monitoring, as well as given patient's stability -If there are further events, attempts to abort events with salient stimuli such as saline flush applied to  the eyes and assess for patient's responsiveness -If patient begins to experience significant hemodynamic instability with further episodes such as hypotension or severe hypertension, desaturation, administer Ativan 1 mg and reach out to neurology -CTA head and neck stat -ED to ED transfer to [facility] -MRI brain without contrast at [facility] -EEG, LTM hookup for spell capture and characterization -Inpatient neurology to follow at [facility] -Discussed with Dr. Thomasena Edis (neurology at [facility]), please notify neurology when patient arrives  [facility] MD-PhD [facility] [phone]

## 2022-01-02 NOTE — ED Notes (Signed)
The  pt is suppose to  go to mri  she has a patch on her lt shoulder that is unknown notmarked she cannot go until that has been verified  husbnad is supposed to be on the way here

## 2022-01-02 NOTE — ED Notes (Signed)
Patient transported to CT 

## 2022-01-02 NOTE — ED Notes (Signed)
Pt transported by [facility] from [facility] for a cardiac workup and a neuro workup. Pt needs an 24hr EEG. Pt has intermittent LOC and SVT. Tara Mahoney unsure what to do for the patient so they sent her here for evaluation.

## 2022-01-02 NOTE — ED Notes (Signed)
Attempted report to ED charge nurse

## 2022-01-02 NOTE — ED Notes (Signed)
The pt has been seen by the neuro doctor just now.  The pt will not open her eyes or answer any questions asked by the nurse

## 2022-01-02 NOTE — ED Notes (Signed)
Multiple meds placed around 1630  not verified unable to give

## 2022-01-02 NOTE — ED Notes (Signed)
Pt is alert and oriented eyes opened and speaking to family at bedside.

## 2022-01-02 NOTE — ED Notes (Signed)
The pt shakes her head when questions  asked  when iasked her if she could talk she shook her head no  the husband is supposed to be on the way here

## 2022-01-03 ENCOUNTER — Observation Stay (HOSPITAL_COMMUNITY): Payer: BC Managed Care – PPO

## 2022-01-03 ENCOUNTER — Encounter (HOSPITAL_COMMUNITY): Payer: BC Managed Care – PPO

## 2022-01-03 DIAGNOSIS — I471 Supraventricular tachycardia: Secondary | ICD-10-CM | POA: Diagnosis not present

## 2022-01-03 DIAGNOSIS — R404 Transient alteration of awareness: Secondary | ICD-10-CM

## 2022-01-03 DIAGNOSIS — R569 Unspecified convulsions: Secondary | ICD-10-CM | POA: Diagnosis not present

## 2022-01-03 MED ORDER — ORAL CARE MOUTH RINSE: 15.0000 mL | OROMUCOSAL | Status: DC | PRN

## 2022-01-03 MED ORDER — LORAZEPAM 2 MG/ML IJ SOLN
2.0000 mg | Freq: Once | INTRAMUSCULAR | Status: AC
  Administered 2022-01-03: 2 mg via INTRAVENOUS

## 2022-01-03 MED ORDER — ASPIRIN 300 MG RE SUPP
300.0000 mg | Freq: Every day | RECTAL | Status: DC
  Filled 2022-01-03 (×3): qty 1

## 2022-01-03 NOTE — Progress Notes (Signed)
LTM EEG hooked up and running - no initial skin breakdown - push button tested - neuro notified. [facility] monitoring.  

## 2022-01-03 NOTE — Progress Notes (Signed)
Patient has had seizure x2. MD and PA notified. PA came and assessed patient. 2 mg IV ativan administered. EEG performed. VS stable. Patient currently sleeping in no acute distress. Brynda Rim, RN

## 2022-01-03 NOTE — Progress Notes (Signed)
Neurology Progress Note  Brief HPI:   Tara Mahoney is a 36 y.o. female presenting with shaking spells and altered ability to speak, with a past medical history of palpitations followed by cardiology, mild to moderate mitral regurgitation, concern for POTS syndrome, chart reported atrial fibrillation but not documented in cardiology notes, remote subcentimeter asymptomatic left inferior cerebral stroke seen on MRI brain.   6/17- she was speaking to her husband at 8:30 AM when she had behavioral arrest, started shaking, and stopped talking with heart rates in the 130s to 150s, and subsequently she is appropriately answering questions by shaking and nodding her head but not otherwise speaking.  Subsequently she had additional episodes in the ED where her jaw was clenched, she was gripping her family members and hand and she had a lip tremor with nonresponsiveness to stroking of her face, with heart rates from the 100s to 150s, blood pressure not recorded.  After 10 minutes of this activity she was given 1 mg of Ativan which resolved her symptoms and afterwards she had no postevent confusion.     Subjective: Patient seen in room with husband at the bedside.  6/18- Examiner was called to the bedside for seizure like episode in which she had upper extremity tremors and roving eye movements, tachycardia, with no respiratory compromise. 2mg  of ativan was given and stat EEG was order and then subsequently changed to LTM.   Exam: Vitals:   01/03/22 0339 01/03/22 0840  BP: 106/69 109/66  Pulse: 90 90  Resp: 18 20  Temp: 97.9 F (36.6 C) 98.9 F (37.2 C)  SpO2: 99% 99%   Gen: In bed, NAD Resp: non-labored breathing, no acute distress Abd: soft, nt  Neuro: Mental Status: Patient is unresponsive to verbal stimuli, but she will move all extremities spontaneously. She will not answer orientation questions or open her eyes Cranial Nerves: II: PERRL.  42mm and brisk III,IV, VI: gaze is midline V:  Facial sensation is not testable VII: Facial movement is symmetric resting and smiling VIII: Hearing is intact to voice X: Palate elevates symmetrically XI: Shoulder shrug is symmetric. XII: Tongue protrudes midline without atrophy or fasciculations.  Motor: Tone is normal. Bulk is normal. All extremities move antigravity Sensory: Gross sensation intact DTRs: 2+ and symmetric in the biceps and patellae.  Plantars: Toes are downgoing bilaterally.  Cerebellar: Not cooperative, no gross ataxia noted in spontaneous movements Gait: deferred for safety    Pertinent Labs: CMP and CBC unremarkable UDS negative Elevated specific gravity urine- >1.046 Na 135 K 3.5  Imaging Reviewed: MR brain wo contrast- Subcentimeter chronic infarct within the inferior left cerebellar hemisphere.  MR Brain w/ wo contrast- pending  EEG- pending- read with Dr. 11m- correlation on EEG to events.   Assessment:  36 y.o. female with a PMH of palpitations , mild to moderate mitral regurgitation, concern for POTS syndrome, remote subcentimeter asymptomatic left inferior cerebral stroke seen on MRI brain presenting with shaking spells and AMS. EEG LTM pending, she has received ativan to abort the shaking spells with some success. On exam she appears post ictal and is nonverbal with no response to verbal stimuli.   Impression:  Convulsive syncope vs seizure   Recommendations: - LTM ordered - MRI head pending - Close neuro monitoring -  Seizure precautions: Discussed [facility] statutes, patients with seizures are not allowed to drive until they have been seizure-free for six months Use caution when using heavy equipment or power tools. Avoid working on [facility]  or at heights. Take showers instead of baths. Ensure the water temperature is not too high on the home water heater. Do not go swimming alone. Do not lock yourself in a room alone (i.e. bathroom). When caring for infants or small  children, sit down when holding, feeding, or changing them to minimize risk of injury to the child in the event you have a seizure. Maintain good sleep hygiene. Avoid alcohol.     Patient seen and examined by NP/APP with MD. MD to update note as needed.   Elmer Picker, DNP, FNP-BC [facility] Pager: [phone]

## 2022-01-03 NOTE — Progress Notes (Signed)
PROGRESS NOTE    Laekyn Rayos  VHQ:[id number] DOB: 06-Jun-1986 DOA: 01/02/2022 PCP: Ponciano Ort [facility]    Chief Complaint  Patient presents with   Tachycardia    Brief Narrative:    Ardyn Forge is a 36 y.o. female with medical history significant of PSVT, intractable migraines, HLD, presented with new onset of seizure.   Patient not talking but only answer questions with nodding or shaking head.  This morning, family member found patient suddenly became unresponsive along with whole body shaking and EMS called.  EMS arrived found patient very tachycardia heart rate 130s.  And patient was shifted to [facility], ED.  There was a witnessed tonic-clonic seizure in the [facility] ED, Ativan x1 was given and seizure broke.  Patient remained awake however not talking.  Brain MRI negative for acute findings.  Patient denies any tongue biting or urine stool incontinence.  Assessment & Plan:   Principal Problem:   Seizure (HCC) Active Problems:   Intractable chronic migraine without aura and without status migrainosus  Seizure -Neurology consultation appreciated -As per neurology recommendation we will hold off Keppra further doses -As needed Ativan -EEG underway -Patient had few episodes of the seizures today, which resolved without intervention, as discussed with neurology, they will arrange for LTM EEG to try to capture these events.   History of PSVT -Fairly controlled, patient does have extensive allergy to beta-blocker and calcium channel blocker and has been on a regimen of flecainide and clonidine regimen for rate control. -Continue with ASA   Migraines -Continue Topamax and as needed sumatriptan     DVT prophylaxis: Lovenox Code Status: Full Family Communication: None at bedside Disposition:   Status is: Observation Still need more    Consultants:  neurology   Subjective:  Patient reports she had a syncopal episode earlier today, she did not notify the  staff  Objective: Vitals:   01/02/22 2242 01/02/22 2245 01/03/22 0339 01/03/22 0840  BP:  107/75 106/69 109/66  Pulse:  79 90 90  Resp:  15 18 20   Temp:  98.1 F (36.7 C) 97.9 F (36.6 C) 98.9 F (37.2 C)  TempSrc:  Oral Oral Oral  SpO2:  100% 99% 99%  Weight: 58 kg     Height:        Intake/Output Summary (Last 24 hours) at 01/03/2022 1253 Last data filed at 01/03/2022 0300 Gross per 24 hour  Intake 591.25 ml  Output --  Net 591.25 ml   Filed Weights   01/02/22 0939 01/02/22 2242  Weight: 58.3 kg 58 kg    Examination:  Awake Alert, Oriented X 3, No new F.N deficits, Normal affect Symmetrical Chest wall movement, Good air movement bilaterally, CTAB RRR,No Gallops,Rubs or new Murmurs, No Parasternal Heave +ve B.Sounds, Abd Soft, No tenderness, No rebound - guarding or rigidity. No Cyanosis, Clubbing or edema, No new Rash or bruise     Patient was seen with female chaperone her RN 01/04/22 at bedside  Data Reviewed: I have personally reviewed following labs and imaging studies  CBC: Recent Labs  Lab 12/30/21 1058 01/02/22 1056  WBC 6.5 8.3  NEUTROABS 4.5 6.6  HGB 13.2 14.3  HCT 39.3 41.5  MCV 94.7 91.6  PLT 132* 164    Basic Metabolic Panel: Recent Labs  Lab 12/30/21 1058 12/30/21 1924 01/02/22 1056  NA 138  --  135  K 4.0  --  3.5  CL 109  --  108  CO2 24  --  21*  GLUCOSE 95  --  95  BUN 17  --  18  CREATININE 0.97  --  0.94  CALCIUM 9.1  --  9.1  MG  --  2.1 2.0    GFR: Estimated Creatinine Clearance: 67.1 mL/min (by C-G formula based on SCr of 0.94 mg/dL).  Liver Function Tests: Recent Labs  Lab 12/30/21 1058 01/02/22 1056  AST 16 16  ALT 14 13  ALKPHOS 33* 34*  BILITOT 0.5 0.6  PROT 7.0 7.2  ALBUMIN 4.1 4.1    CBG: Recent Labs  Lab 01/02/22 1029  GLUCAP 89     No results found for this or any previous visit (from the past 240 hour(s)).       Radiology Studies: CT ANGIO HEAD NECK W WO CM  Result Date:  01/02/2022 CLINICAL DATA:  Neuro deficit, acute, stroke suspected. Patient is nonresponsive. EXAM: CT ANGIOGRAPHY HEAD AND NECK TECHNIQUE: Multidetector CT imaging of the head and neck was performed using the standard protocol during bolus administration of intravenous contrast. Multiplanar CT image reconstructions and MIPs were obtained to evaluate the vascular anatomy. Carotid stenosis measurements (when applicable) are obtained utilizing NASCET criteria, using the distal internal carotid diameter as the denominator. RADIATION DOSE REDUCTION: This exam was performed according to the departmental dose-optimization program which includes automated exposure control, adjustment of the mA and/or kV according to patient size and/or use of iterative reconstruction technique. CONTRAST:  <See Chart> OMNIPAQUE IOHEXOL 350 MG/ML SOLN COMPARISON:  CTA head and neck 01/02/2022 at 12 o'clock p.m. MRA head and neck 12/31/2021. FINDINGS: CT HEAD FINDINGS Brain: No acute infarct, hemorrhage, or mass lesion is present. No significant white matter lesions are present. The ventricles are of normal size. No significant extraaxial fluid collection is present. The brainstem and cerebellum are within normal limits. Vascular: No hyperdense vessel or unexpected calcification. Skull: Calvarium is intact. No focal lytic or blastic lesions are present. No significant extracranial soft tissue lesion is present. Sinuses: The paranasal sinuses and mastoid air cells are clear. Orbits: The globes and orbits are within normal limits. Review of the MIP images confirms the above findings CTA NECK FINDINGS Aortic arch: 3 vessel arch configuration is present. No significant stenosis or aneurysm. Right carotid system: The right common carotid artery is within normal limits. Bifurcation is unremarkable. Cervical right ICA is normal. Left carotid system: The left common carotid artery is within normal limits. Bifurcation is unremarkable. Cervical left ICA  is normal. Vertebral arteries: Vertebral arteries are codominant. Both vertebral arteries originate from the subclavian arteries without significant stenosis. Skeleton: No focal osseous lesions. Other neck: Soft tissues the neck are otherwise unremarkable. Salivary glands are within normal limits. Thyroid is normal. No significant adenopathy is present. No focal mucosal or submucosal lesions are present. Upper chest: Lung apices are clear. Thoracic inlet is within normal limits. Review of the MIP images confirms the above findings CTA HEAD FINDINGS Anterior circulation: The internal carotid arteries are within normal limits from the skull base through the ICA termini. The A1 and M1 segments are normal. The anterior communicating artery is patent. MCA bifurcations are intact. The ACA and MCA branch vessels are normal. Posterior circulation: The vertebral arteries are codominant. PICA origins are visualized and normal. The left posterior cerebral artery originates from basilar tip. The right posterior cerebral artery receives scratched at the right posterior cerebral artery is of fetal type with contribution from a small right P1 segment. PCA branch vessels are within normal limits bilaterally. Venous sinuses: The  dural sinuses are patent. The straight sinus deep cerebral veins are intact. Cortical veins are within normal limits. No significant vascular malformation is evident. Anatomic variants: None Review of the MIP images confirms the above findings IMPRESSION: 1. Normal variant CTA Circle of Willis without significant proximal stenosis, aneurysm, or branch vessel occlusion. 2. Normal CTA of the neck. 3. Normal noncontrast CT of the head. No acute or focal lesion to explain the patient's symptoms. Electronically Signed   By: San Morelle M.D.   On: 01/02/2022 18:45   CT ANGIO HEAD NECK W WO CM  Result Date: 01/02/2022 CLINICAL DATA:  36 year old female with recent seizure. MRI reveals small chronic  left cerebellar infarct. Unrevealing MRA. EXAM: CT ANGIOGRAPHY HEAD AND NECK TECHNIQUE: Multidetector CT imaging of the head and neck was performed using the standard protocol during bolus administration of intravenous contrast. Multiplanar CT image reconstructions and MIPs were obtained to evaluate the vascular anatomy. Carotid stenosis measurements (when applicable) are obtained utilizing NASCET criteria, using the distal internal carotid diameter as the denominator. RADIATION DOSE REDUCTION: This exam was performed according to the departmental dose-optimization program which includes automated exposure control, adjustment of the mA and/or kV according to patient size and/or use of iterative reconstruction technique. CONTRAST:  73mL OMNIPAQUE IOHEXOL 350 MG/ML SOLN COMPARISON:  MRA head and neck 12/31/2021. Head CT 08/13/2020. FINDINGS: CT HEAD Brain: Stable since January and negative noncontrast CT appearance of the brain. Small chronic left posterior cerebellar infarct essentially occult by CT (series 5, image 26). Calvarium and skull base: Negative. Paranasal sinuses: Visualized paranasal sinuses and mastoids are stable and well aerated. Orbits: Leftward gaze, otherwise negative orbits and scalp. CTA NECK Skeleton: Reversal of the normal cervical lordosis, but otherwise negative. No acute osseous abnormality identified. Upper chest: Negative. Other neck: Negative. Aortic arch: Negative. Three vessel arch configuration. Right carotid system: Negative. Left carotid system: Proximal left CCA partially obscured by dense adjacent venous contrast streak artifact. Otherwise negative. Vertebral arteries: Negative; some proximal vertebral artery detail mildly obscured by dense adjacent venous contrast reflux. CTA HEAD Posterior circulation: Codominant distal vertebral arteries appear normal to the vertebrobasilar junction. Patent PICA origins. Patent basilar artery without stenosis. Fetal type right PCA origin. Patent  basilar tip, SCA and left PCA origins. Bilateral PCA branches are within normal limits. Anterior circulation: Both ICA siphons are patent without stenosis. Normal right posterior communicating artery origin. Patent carotid termini, MCA and ACA origins. Diminutive or absent anterior communicating artery. Bilateral ACA branches are within normal limits. Left MCA M1 segment and bifurcation are patent without stenosis. Right MCA M1 segment and bifurcation are patent without stenosis. Bilateral MCA branches are within normal limits. Venous sinuses: Patent. Anatomic variants: Fetal type right PCA origin. Review of the MIP images confirms the above findings IMPRESSION: 1. Normal CTA Head and Neck. 2.  No acute intracranial abnormality. Electronically Signed   By: Genevie Ann M.D.   On: 01/02/2022 12:19        Scheduled Meds:  aspirin  300 mg Rectal Daily   cloNIDine  0.1 mg Transdermal Q Fri   enoxaparin (LOVENOX) injection  40 mg Subcutaneous Q24H   flecainide  50 mg Oral BID   FLUoxetine  20 mg Oral Daily   LORazepam  1 mg Intravenous Once   magnesium oxide  400 mg Oral Daily   simvastatin  10 mg Oral QHS   topiramate  100 mg Oral BID   Continuous Infusions:  sodium chloride 75 mL/hr (01/03/22 0722)  LOS: 0 days      Huey Bienenstock, MD [facility]   To contact the attending provider between 7A-7P or the covering provider during after hours 7P-7A, please log into the web site [email or web address] and access using universal [place] password for that web site. If you do not have the password, please call the hospital operator.  01/03/2022, 12:53 PM

## 2022-01-03 NOTE — Procedures (Signed)
Patient Name: Tara Mahoney  MRN: [id number]  Epilepsy Attending: Charlsie Quest  Referring Physician/Provider: Elmer Picker, NP  Date: 01/03/2022 Duration: 23.15 mins  Patient history: 36yo F with seizure liek activity. EEG to evaluate for seizure.  Level of alertness: Awake, asleep  AEDs during EEG study: None  Technical aspects: This EEG study was done with scalp electrodes positioned according to the 10-20 International system of electrode placement. Electrical activity was acquired at a sampling rate of 500Hz  and reviewed with a high frequency filter of 70Hz  and a low frequency filter of 1Hz . EEG data were recorded continuously and digitally stored.   Description: The posterior dominant rhythm consists of 10 Hz activity of moderate voltage (25-35 uV) seen predominantly in posterior head regions, symmetric and reactive to eye opening and eye closing. Sleep was characterized by vertex waves, sleep spindles (12 to 14 Hz), maximal frontocentral region.  There is an excessive amount of 15 to 18 Hz beta activity distributed symmetrically and diffusely.  Hyperventilation and photic stimulation were not performed.     ABNORMALITY - Excessive beta, generalized  IMPRESSION: This study is within normal limits. The excessive beta activity seen in the background is most likely due to the effect of benzodiazepine and is a benign EEG pattern. No seizures or epileptiform discharges were seen throughout the recording.   Tyge Somers 

## 2022-01-03 NOTE — Progress Notes (Signed)
EEG complete - results pending 

## 2022-01-04 ENCOUNTER — Encounter: Payer: Self-pay | Admitting: Internal Medicine

## 2022-01-04 ENCOUNTER — Inpatient Hospital Stay (HOSPITAL_COMMUNITY): Payer: BC Managed Care – PPO | Admitting: Hematology

## 2022-01-04 DIAGNOSIS — R569 Unspecified convulsions: Secondary | ICD-10-CM | POA: Diagnosis not present

## 2022-01-04 DIAGNOSIS — Z885 Allergy status to narcotic agent status: Secondary | ICD-10-CM | POA: Diagnosis not present

## 2022-01-04 DIAGNOSIS — I472 Ventricular tachycardia, unspecified: Secondary | ICD-10-CM | POA: Diagnosis present

## 2022-01-04 DIAGNOSIS — Z888 Allergy status to other drugs, medicaments and biological substances status: Secondary | ICD-10-CM | POA: Diagnosis not present

## 2022-01-04 DIAGNOSIS — Z79899 Other long term (current) drug therapy: Secondary | ICD-10-CM | POA: Diagnosis not present

## 2022-01-04 DIAGNOSIS — F445 Conversion disorder with seizures or convulsions: Secondary | ICD-10-CM | POA: Diagnosis present

## 2022-01-04 DIAGNOSIS — G90A Postural orthostatic tachycardia syndrome (POTS): Secondary | ICD-10-CM | POA: Diagnosis present

## 2022-01-04 DIAGNOSIS — Z6822 Body mass index (BMI) 22.0-22.9, adult: Secondary | ICD-10-CM | POA: Diagnosis not present

## 2022-01-04 DIAGNOSIS — Z8673 Personal history of transient ischemic attack (TIA), and cerebral infarction without residual deficits: Secondary | ICD-10-CM | POA: Diagnosis not present

## 2022-01-04 DIAGNOSIS — F419 Anxiety disorder, unspecified: Secondary | ICD-10-CM | POA: Diagnosis present

## 2022-01-04 DIAGNOSIS — F4321 Adjustment disorder with depressed mood: Secondary | ICD-10-CM | POA: Diagnosis present

## 2022-01-04 DIAGNOSIS — E785 Hyperlipidemia, unspecified: Secondary | ICD-10-CM | POA: Diagnosis present

## 2022-01-04 DIAGNOSIS — Z7982 Long term (current) use of aspirin: Secondary | ICD-10-CM | POA: Diagnosis not present

## 2022-01-04 DIAGNOSIS — Z8249 Family history of ischemic heart disease and other diseases of the circulatory system: Secondary | ICD-10-CM | POA: Diagnosis not present

## 2022-01-04 DIAGNOSIS — G43709 Chronic migraine without aura, not intractable, without status migrainosus: Secondary | ICD-10-CM | POA: Diagnosis present

## 2022-01-04 DIAGNOSIS — R404 Transient alteration of awareness: Secondary | ICD-10-CM | POA: Diagnosis present

## 2022-01-04 DIAGNOSIS — I471 Supraventricular tachycardia: Secondary | ICD-10-CM | POA: Diagnosis not present

## 2022-01-04 DIAGNOSIS — Z87891 Personal history of nicotine dependence: Secondary | ICD-10-CM | POA: Diagnosis not present

## 2022-01-04 DIAGNOSIS — E440 Moderate protein-calorie malnutrition: Secondary | ICD-10-CM | POA: Insufficient documentation

## 2022-01-04 DIAGNOSIS — Z90710 Acquired absence of both cervix and uterus: Secondary | ICD-10-CM | POA: Diagnosis not present

## 2022-01-04 MED ORDER — LORAZEPAM 2 MG/ML IJ SOLN: 0.5000 mg | INTRAMUSCULAR | Status: DC | PRN

## 2022-01-04 MED ORDER — ASPIRIN 81 MG PO TBEC
81.0000 mg | DELAYED_RELEASE_TABLET | Freq: Every day | ORAL | Status: DC
  Administered 2022-01-04 – 2022-01-06 (×3): 81 mg via ORAL
  Filled 2022-01-04 (×3): qty 1

## 2022-01-04 MED ORDER — ADULT MULTIVITAMIN W/MINERALS CH
1.0000 | ORAL_TABLET | Freq: Every day | ORAL | Status: DC
  Administered 2022-01-04 – 2022-01-06 (×3): 1 via ORAL
  Filled 2022-01-04 (×3): qty 1

## 2022-01-04 MED ORDER — BOOST / RESOURCE BREEZE PO LIQD CUSTOM
1.0000 | Freq: Three times a day (TID) | ORAL | Status: DC
  Administered 2022-01-05 – 2022-01-06 (×3): 1 via ORAL

## 2022-01-04 NOTE — Procedures (Signed)
Patient Name: Tara Mahoney  MRN: [id number]  Epilepsy Attending: Charlsie Quest  Referring Physician/Provider: Elmer Picker, NP  Duration: 01/03/2022 1108 to 01/04/2022 1108   Patient history: 36yo F with seizure liek activity. EEG to evaluate for seizure.   Level of alertness: Awake, asleep   AEDs during EEG study: TPM   Technical aspects: This EEG study was done with scalp electrodes positioned according to the 10-20 International system of electrode placement. Electrical activity was acquired at a sampling rate of 500Hz  and reviewed with a high frequency filter of 70Hz  and a low frequency filter of 1Hz . EEG data were recorded continuously and digitally stored.    Description: The posterior dominant rhythm consists of 10 Hz activity of moderate voltage (25-35 uV) seen predominantly in posterior head regions, symmetric and reactive to eye opening and eye closing. Sleep was characterized by vertex waves, sleep spindles (12 to 14 Hz), maximal frontocentral region. Hyperventilation and photic stimulation were not performed.     Event button was pressed on 01/03/2022 at 1112 and on 01/04/2022 at 0814. Patient was laying in bed with eyes closed, hyperventilating, bilateral upper extremity tremor-like movements, not responding.  Concomitant EEG before, during and after the event showed normal posterior dominant rhythm.    IMPRESSION: This study is within normal limits. No seizures or epileptiform discharges were seen throughout the recording.  Two events were recorded as described above without concomitant EEG change.  These events were NON-epileptic.     Sulaiman Imbert 

## 2022-01-04 NOTE — Progress Notes (Signed)
Pt had another episode of seizure like witness by husband at the bedside with the same symptom like this am. VSS. MD notified. Will continue to monitor the pt

## 2022-01-04 NOTE — Progress Notes (Signed)
Subjective: Has had 2 seizure-like episodes overnight.  Aunt at bedside.  ROS: negative except above  Examination  Vital signs in last 24 hours: Temp:  [98 F (36.7 C)-99.7 F (37.6 C)] 98 F (36.7 C) (06/19 1212) Pulse Rate:  [69-129] 80 (06/19 0845) Resp:  [15-27] 15 (06/19 1212) BP: (95-120)/(55-91) 108/74 (06/19 1212) SpO2:  [97 %-100 %] 100 % (06/19 0845)  General: lying in bed, NAD Neuro: Awake, speaking in low tone voice but oriented, following all commands, cranial nerves II to XII grossly intact, 4/5 in upper extremities (some concern of lack of effort)  Basic Metabolic Panel: Recent Labs  Lab 12/30/21 1058 12/30/21 1924 01/02/22 1056  NA 138  --  135  K 4.0  --  3.5  CL 109  --  108  CO2 24  --  21*  GLUCOSE 95  --  95  BUN 17  --  18  CREATININE 0.97  --  0.94  CALCIUM 9.1  --  9.1  MG  --  2.1 2.0    CBC: Recent Labs  Lab 12/30/21 1058 01/02/22 1056  WBC 6.5 8.3  NEUTROABS 4.5 6.6  HGB 13.2 14.3  HCT 39.3 41.5  MCV 94.7 91.6  PLT 132* 164     Coagulation Studies: No results for input(s): "LABPROT", "INR" in the last 72 hours.  Imaging No new brain imaging overnight   ASSESSMENT AND PLAN: 36 year old female with multiple medical comorbidities presented with new onset seizure-like episodes.  (Psychogenic) nonepileptic events -LTM EEG recorded 2 events which were consistent with nonepileptic events  Recommendations -We will continue LTM EEG overnight as patient is continue to have episodes -Discussed the diagnosis of nonepileptic events and coping strategies with patient and aunt at bedside -We do not need to add any further antiseizure medication for these episodes -Avoid Ativan for these episodes unless it is affecting vital signs.  Of note patient already has intermittent tachycardia due to POTS and other cardiac issues -Please consider psychiatry consult as patient reported recently increased stress which is likely the trigger for these  events -Discussed seizure precautions including do not drive -We will likely discontinue LTM EEG tomorrow and then patient will be ready for discharge from neurology standpoint -Discussed plan with patient's RN and Dr. Randol Kern  I have spent a total of 50  minutes with the patient reviewing hospital notes,  test results, labs and examining the patient as well as establishing an assessment and plan that was discussed personally with the patient.  > 50% of time was spent in direct patient care.     Lindie Spruce Epilepsy [facility] For questions after 5pm please refer to AMION to reach the Neurologist on call

## 2022-01-04 NOTE — Significant Event (Signed)
Rapid Response Event Note   Reason for Call :  Seizure activity  Initial Focused Assessment:  On arrival , pt postictal, Pupils +4 brisk. Currently hooked up to continuous EEG.   HR 89, BP 116/91, RR 16, spO2 100% on 2L [place].    Interventions:  4mg  ativan given prior to my arrival (0820) Md made aware Neurology at bedside to review EEG and speak with family.  Plan of Care:  Continue to monitor pt for seizure activity.  RN instructed to call with any changes or concerns.   Event Summary:   MD Notified: PTA and again @ 0840 Call Time: 0833 Arrival Time: 0837 End Time: 0857  0834, RN

## 2022-01-04 NOTE — Progress Notes (Addendum)
Initial Nutrition Assessment  DOCUMENTATION CODES:   Non-severe (moderate) malnutrition in context of chronic illness  INTERVENTION:   Boost Breeze po TID, each supplement provides 250 kcal and 9 grams of protein.  Magic cup TID with meals, each supplement provides 290 kcal and 9 grams of protein.  Vital Cuisine Shake BID, each supplement provides 520 kcal and 22 grams of protein.  MVI with minerals daily.  Liberalize diet to regular to increase menu options and PO intake.   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  GOAL:   Patient will meet greater than or equal to 90% of their needs  MONITOR:   PO intake, Supplement acceptance  REASON FOR ASSESSMENT:   Malnutrition Screening Tool    ASSESSMENT:   36 yo female admitted with seizure like activity. PMH includes MVP, heart murmur, dysautonomia, V tach, former smoker. 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  Patient is currently hooked up to continuous EEG.   Per discussion with patient's husband and daughter at bedside, patient typically eats like a bird. She doesn't really eat a full meal unless they go out to eat and then she usually takes a lot of the meal home for leftovers. Patient lost ~20 lbs approximately one year ago when she had a brain injury. Since then, she has maintained weight in the 120's. Her outpatient physicians ([facility]) are concerned about her weight. She drinks water and a little coffee with medications in the mornings; no longer drinks sodas. They have Boost supplements at home, but patient does not really like them so she doesn't drink them. We discussed supplement options and they are willing to try them all to see what she will accept. Husband is willing to purchase whatever she likes for use at home. We also discussed changing diet to regular to  increase meal options and hopefully PO intake.   Patient has lost ~15% of her usual weight within the past year, which is not significant for the time frame.   Labs and medications reviewed.  Meal intakes not recorded. Currently on a heart healthy diet, but unable to choose a few items she likes d/t restricted diet.   Patient meets criteria for moderate malnutrition, given mild depletion of muscle and subcutaneous fat mass.  NUTRITION - FOCUSED PHYSICAL EXAM:  Flowsheet Row Most Recent Value  Orbital Region Mild depletion  Upper Arm Region Mild depletion  Thoracic and Lumbar Region No depletion  Buccal Region Mild depletion  Temple Region Mild depletion  Clavicle Bone Region Mild depletion  Clavicle and Acromion Bone Region No depletion  Scapular Bone Region No depletion  Dorsal Hand Mild depletion  Patellar Region No depletion  Anterior Thigh Region No depletion  Posterior Calf Region Mild depletion  Edema (RD Assessment) None  Hair Reviewed  Eyes Reviewed  Mouth Reviewed  Skin Reviewed  Nails Reviewed       Diet Order:   Diet Order             Diet Heart Room service appropriate? Yes; Fluid consistency: Thin  Diet effective now                   EDUCATION NEEDS:   Education needs have been addressed  Skin:  Skin Assessment: Reviewed RN Assessment  Last BM:  6/14  Height:   Ht Readings from Last 1 Encounters:  01/02/22  5\' 3"  (1.6 m)    Weight:   Wt Readings from Last 1 Encounters:  01/02/22 58 kg    Ideal Body Weight:  52.3 kg  BMI:  Body mass index is 22.65 kg/m.  Estimated Nutritional Needs:   Kcal:  1800-2000  Protein:  70-90 gm  Fluid:  1.8-2 L    01/04/22 RD, LDN, CNSC Please refer to Amion for contact information.

## 2022-01-04 NOTE — Progress Notes (Signed)
Approximately around 8:20. Received call bell from pt's aunt reporting pt experiencing seize like activity with shakings arms, gasping for breath, and grinding teeth. Assessment found pt lost consciousness, not respond to the call, and grinding teeth with HR=123-129. Ativan 4mg  given. Obtained VS. Called RRT, and page MD. MD present at the bedside. Will continue to monitor the pt.   , RN

## 2022-01-04 NOTE — Progress Notes (Signed)
LTM maint., all impedance below 5K no skin breakdown

## 2022-01-04 NOTE — Progress Notes (Signed)
Pt had another episode of seizure like shaking. Husband present at bedside. BP 106/70, HR 100. Lasted less than 5 minutes. Pt now awake and talking to me. Paged on call MD to notifiy of episode per instructions. Continuous EEG is in process.

## 2022-01-04 NOTE — Progress Notes (Signed)
PROGRESS NOTE    Tara Mahoney  VFI:[id number] DOB: [birth year] DOA: 01/02/2022 PCP: [facility]    Chief Complaint  Patient presents with   Tachycardia    Brief Narrative:    Tara Mahoney is a 36 y.o. female with medical history significant of PSVT, intractable migraines, HLD, presented with new onset of seizure.   Patient not talking but only answer questions with nodding or shaking head.  This morning, family member found patient suddenly became unresponsive along with whole body shaking and EMS called.  EMS arrived found patient very tachycardia heart rate 130s.  And patient was shifted to [facility] ED.  There was a witnessed tonic-clonic seizure in the [facility] ED, Ativan x1 was given and seizure broke.  Patient remained awake however not talking.  Brain MRI negative for acute findings.  Patient denies any tongue biting or urine stool incontinence.  Assessment & Plan:   Principal Problem:   Seizure (HCC) Active Problems:   Intractable chronic migraine without aura and without status migrainosus   Malnutrition of moderate degree  Seizure-like activity -Neurology consultation appreciated -As per neurology recommendation we will hold off Keppra further doses -Patient with multiple episodes over last 24 hours, described as generalized shaking, teeth grinding, and tongue growling, but these episodes did not correlate with any seizure activity on continuous EEG monitoring. -Continue with LTM EEG overnight as she continues to have these episodes. -No indication for any antiseizure medications given these are nonepileptic events. -Shunt with significant stressful events recently, psych consulted per neuro recommendations.   History of PSVT -Fairly controlled, patient does have extensive allergy to beta-blocker and calcium channel blocker and has been on a regimen of flecainide and clonidine regimen for rate control. -Continue with ASA -Continue with telemetry  monitoring, she is having short episodes of sinus tachycardia -EP cardiology has been notified about patient admission per family's request   Migraines -Continue Topamax and as needed sumatriptan     DVT prophylaxis: Lovenox Code Status: Full Family Communication: Discussed with husband at bedside in the afternoon Disposition:      Consultants:  neurology   Subjective:  She reports she had an episode earlier this morning,  Objective: Vitals:   01/04/22 0832 01/04/22 0845 01/04/22 0950 01/04/22 1212  BP: (!) 116/91 118/78 115/76 108/74  Pulse: 93 80    Resp: 17 15  15   Temp:   98.6 F (37 C) 98 F (36.7 C)  TempSrc:   Axillary Oral  SpO2: 98% 100%    Weight:      Height:        Intake/Output Summary (Last 24 hours) at 01/04/2022 1514 Last data filed at 01/04/2022 1322 Gross per 24 hour  Intake 2556.14 ml  Output 200 ml  Net 2356.14 ml   Filed Weights   01/02/22 0939 01/02/22 2242  Weight: 58.3 kg 58 kg    Examination:  Patient is sleepy, but wakes up and answer questions appropriately oriented X 3, No new F.N deficits, Normal affect she is speaking in low tone of voice Symmetrical Chest wall movement, Good air movement bilaterally, CTAB RRR,No Gallops,Rubs or new Murmurs, No Parasternal Heave +ve B.Sounds, Abd Soft, No tenderness, No rebound - guarding or rigidity. Overall no gross deficits could be found, but some inconsistent generalized weakness and due to lack of effort    Patient was seen with female chaperone her RN 01/04/22  at bedside  Data Reviewed: I have personally reviewed following labs and imaging studies  CBC: Recent Labs  Lab 12/30/21 1058 01/02/22 1056  WBC 6.5 8.3  NEUTROABS 4.5 6.6  HGB 13.2 14.3  HCT 39.3 41.5  MCV 94.7 91.6  PLT 132* [id number]    Basic Metabolic Panel: Recent Labs  Lab 12/30/21 1058 12/30/21 1924 01/02/22 1056  NA 138  --  135  K 4.0  --  3.5  CL 109  --  108  CO2 24  --  21*  GLUCOSE 95  --  95  BUN 17   --  18  CREATININE 0.97  --  0.94  CALCIUM 9.1  --  9.1  MG  --  2.1 2.0    GFR: Estimated Creatinine Clearance: 67.1 mL/min (by C-G formula based on SCr of 0.94 mg/dL).  Liver Function Tests: Recent Labs  Lab 12/30/21 1058 01/02/22 1056  AST 16 16  ALT 14 13  ALKPHOS 33* 34*  BILITOT 0.5 0.6  PROT 7.0 7.2  ALBUMIN 4.1 4.1    CBG: Recent Labs  Lab 01/02/22 1029  GLUCAP 89     No results found for this or any previous visit (from the past 240 hour(s)).       Radiology Studies: Overnight EEG with video  Result Date: 01/04/2022 Lora Havens, MD     01/04/2022 10:13 AM Patient Name: Tara Mahoney MRN: [id number] Epilepsy Attending: Lora Havens Referring Physician/Provider: Janine Ores, NP Duration: 01/03/2022 1108 to 01/04/2022 1015  Patient history: 36yo F with seizure liek activity. EEG to evaluate for seizure.  Level of alertness: Awake, asleep  AEDs during EEG study: TPM  Technical aspects: This EEG study was done with scalp electrodes positioned according to the 10-20 International system of electrode placement. Electrical activity was acquired at a sampling rate of 500Hz  and reviewed with a high frequency filter of 70Hz  and a low frequency filter of 1Hz . EEG data were recorded continuously and digitally stored.  Description: The posterior dominant rhythm consists of 10 Hz activity of moderate voltage (25-35 uV) seen predominantly in posterior head regions, symmetric and reactive to eye opening and eye closing. Sleep was characterized by vertex waves, sleep spindles (12 to 14 Hz), maximal frontocentral region. Hyperventilation and photic stimulation were not performed.   Event button was pressed on 01/03/2022 at 1112 and on 01/04/2022 at 0814. Patient was laying in bed with eyes closed, hyperventilating, bilateral upper extremity tremor-like movements, not responding.  Concomitant EEG before, during and after the event showed normal posterior dominant rhythm.   IMPRESSION: This study is within normal limits. No seizures or epileptiform discharges were seen throughout the recording. Two events were recorded as described above without concomitant EEG change.  These events were NON-epileptic.   Lora Havens    EEG adult  Result Date: 01/03/2022 Lora Havens, MD     01/03/2022  4:28 PM Patient Name: Tara Mahoney MRN: [id number] Epilepsy Attending: Lora Havens Referring Physician/Provider: Janine Ores, NP Date: 01/03/2022 Duration: 23.15 mins Patient history: 36yo F with seizure liek activity. EEG to evaluate for seizure. Level of alertness: Awake, asleep AEDs during EEG study: None Technical aspects: This EEG study was done with scalp electrodes positioned according to the 10-20 International system of electrode placement. Electrical activity was acquired at a sampling rate of 500Hz  and reviewed with a high frequency filter of 70Hz  and a low frequency filter of 1Hz . EEG data were recorded continuously and digitally stored. Description: The posterior dominant rhythm consists of 10 Hz activity of moderate voltage (25-35 uV)  seen predominantly in posterior head regions, symmetric and reactive to eye opening and eye closing. Sleep was characterized by vertex waves, sleep spindles (12 to 14 Hz), maximal frontocentral region.  There is an excessive amount of 15 to 18 Hz beta activity distributed symmetrically and diffusely.  Hyperventilation and photic stimulation were not performed.   ABNORMALITY - Excessive beta, generalized IMPRESSION: This study is within normal limits. The excessive beta activity seen in the background is most likely due to the effect of benzodiazepine and is a benign EEG pattern. No seizures or epileptiform discharges were seen throughout the recording. Charlsie Quest   CT ANGIO HEAD NECK W WO CM  Result Date: 01/02/2022 CLINICAL DATA:  Neuro deficit, acute, stroke suspected. Patient is nonresponsive. EXAM: CT ANGIOGRAPHY HEAD AND NECK  TECHNIQUE: Multidetector CT imaging of the head and neck was performed using the standard protocol during bolus administration of intravenous contrast. Multiplanar CT image reconstructions and MIPs were obtained to evaluate the vascular anatomy. Carotid stenosis measurements (when applicable) are obtained utilizing NASCET criteria, using the distal internal carotid diameter as the denominator. RADIATION DOSE REDUCTION: This exam was performed according to the departmental dose-optimization program which includes automated exposure control, adjustment of the mA and/or kV according to patient size and/or use of iterative reconstruction technique. CONTRAST:  <See Chart> OMNIPAQUE IOHEXOL 350 MG/ML SOLN COMPARISON:  CTA head and neck 01/02/2022 at 12 o'clock p.m. MRA head and neck 12/31/2021. FINDINGS: CT HEAD FINDINGS Brain: No acute infarct, hemorrhage, or mass lesion is present. No significant white matter lesions are present. The ventricles are of normal size. No significant extraaxial fluid collection is present. The brainstem and cerebellum are within normal limits. Vascular: No hyperdense vessel or unexpected calcification. Skull: Calvarium is intact. No focal lytic or blastic lesions are present. No significant extracranial soft tissue lesion is present. Sinuses: The paranasal sinuses and mastoid air cells are clear. Orbits: The globes and orbits are within normal limits. Review of the MIP images confirms the above findings CTA NECK FINDINGS Aortic arch: 3 vessel arch configuration is present. No significant stenosis or aneurysm. Right carotid system: The right common carotid artery is within normal limits. Bifurcation is unremarkable. Cervical right ICA is normal. Left carotid system: The left common carotid artery is within normal limits. Bifurcation is unremarkable. Cervical left ICA is normal. Vertebral arteries: Vertebral arteries are codominant. Both vertebral arteries originate from the subclavian  arteries without significant stenosis. Skeleton: No focal osseous lesions. Other neck: Soft tissues the neck are otherwise unremarkable. Salivary glands are within normal limits. Thyroid is normal. No significant adenopathy is present. No focal mucosal or submucosal lesions are present. Upper chest: Lung apices are clear. Thoracic inlet is within normal limits. Review of the MIP images confirms the above findings CTA HEAD FINDINGS Anterior circulation: The internal carotid arteries are within normal limits from the skull base through the ICA termini. The A1 and M1 segments are normal. The anterior communicating artery is patent. MCA bifurcations are intact. The ACA and MCA branch vessels are normal. Posterior circulation: The vertebral arteries are codominant. PICA origins are visualized and normal. The left posterior cerebral artery originates from basilar tip. The right posterior cerebral artery receives scratched at the right posterior cerebral artery is of fetal type with contribution from a small right P1 segment. PCA branch vessels are within normal limits bilaterally. Venous sinuses: The dural sinuses are patent. The straight sinus deep cerebral veins are intact. Cortical veins are within normal limits. No significant  vascular malformation is evident. Anatomic variants: None Review of the MIP images confirms the above findings IMPRESSION: 1. Normal variant CTA Circle of Willis without significant proximal stenosis, aneurysm, or branch vessel occlusion. 2. Normal CTA of the neck. 3. Normal noncontrast CT of the head. No acute or focal lesion to explain the patient's symptoms. Electronically Signed   By: San Morelle M.D.   On: 01/02/2022 18:45        Scheduled Meds:  aspirin EC  81 mg Oral Daily   cloNIDine  0.1 mg Transdermal Q Fri   enoxaparin (LOVENOX) injection  40 mg Subcutaneous Q24H   feeding supplement  1 Container Oral TID BM   flecainide  50 mg Oral BID   FLUoxetine  20 mg Oral  Daily   LORazepam  1 mg Intravenous Once   magnesium oxide  400 mg Oral Daily   multivitamin with minerals  1 tablet Oral Daily   simvastatin  10 mg Oral QHS   topiramate  100 mg Oral BID   Continuous Infusions:  sodium chloride 75 mL/hr (01/04/22 1322)     LOS: 0 days      Phillips Climes, MD [facility]   To contact the attending provider between 7A-7P or the covering provider during after hours 7P-7A, please log into the web site [email or web address] and access using universal [place] password for that web site. If you do not have the password, please call the hospital operator.  01/04/2022, 3:14 PM

## 2022-01-05 ENCOUNTER — Ambulatory Visit: Payer: BC Managed Care – PPO | Admitting: Student

## 2022-01-05 DIAGNOSIS — R569 Unspecified convulsions: Secondary | ICD-10-CM | POA: Diagnosis not present

## 2022-01-05 DIAGNOSIS — F445 Conversion disorder with seizures or convulsions: Secondary | ICD-10-CM | POA: Diagnosis not present

## 2022-01-05 DIAGNOSIS — R404 Transient alteration of awareness: Secondary | ICD-10-CM | POA: Diagnosis not present

## 2022-01-05 DIAGNOSIS — F4321 Adjustment disorder with depressed mood: Secondary | ICD-10-CM

## 2022-01-05 DIAGNOSIS — I471 Supraventricular tachycardia: Secondary | ICD-10-CM | POA: Diagnosis not present

## 2022-01-05 LAB — BLOOD GAS, ARTERIAL
Acid-base deficit: 4.7 mmol/L — ABNORMAL HIGH (ref 0.0–2.0)
Bicarbonate: 19.5 mmol/L — ABNORMAL LOW (ref 20.0–28.0)
O2 Saturation: 98.9 %
Patient temperature: 36.7
pCO2 arterial: 33 mmHg (ref 32–48)
pH, Arterial: 7.38 (ref 7.35–7.45)
pO2, Arterial: 89 mmHg (ref 83–108)

## 2022-01-05 NOTE — Social Work (Signed)
Received consult from Dr. Jerrel Ivory- request to fax clinical clinical information to [facility] @ [phone], review for  Mental Health PHP- CSW informed patient is agreeable.   Information sent to [place] at [facility] as requested.   Antony Blackbird, MSW, LCSW Clinical Social Worker
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
## 2022-01-05 NOTE — Progress Notes (Signed)
vLTM disconnect  No skin breakdown  [facility] notified

## 2022-01-05 NOTE — Progress Notes (Signed)
PROGRESS NOTE    [name]  [id number] DOB: 06/19/1986 DOA: 01/02/2022 PCP: Alanson Puls [facility]    Chief Complaint  Patient presents with   Tachycardia    Brief Narrative:    Tara Mahoney is a 36 y.o. female with medical history significant of PSVT, intractable migraines, HLD, presented with new onset of seizure.   Patient not talking but only answer questions with nodding or shaking head.  This morning, family member found patient suddenly became unresponsive along with whole body shaking and EMS called.  EMS arrived found patient very tachycardia heart rate 130s.  And patient was shifted to [facility] ED.  There was a witnessed tonic-clonic seizure in [facility]  ED, Ativan x1 was given and seizure broke.  Patient remained awake however not talking.   -Patient continued to have these events during hospital stay, she was connected to LTM EEG monitor, these events did not correlate with any seizure activities, she had no seizures or epileptiform activity is noted at all, as well these events did not correlate with any arrhythmias, sinus tachycardia events on the telemetry monitor.  Assessment & Plan:   Principal Problem:   Seizure ([place]) Active Problems:   Intractable chronic migraine without aura and without status migrainosus   Malnutrition of moderate degree  Seizure-like activity/psychogenic nonepileptic events -Neurology consultation appreciated -Patient had multiple events of tremors, grinding teeth, decreased responsiveness, LTM EEG has been connected, none of these events correlated with any seizure activities. -It was felt these episodes are related to anxiety, life stressors, catheter were consulted.Marland Kitchen    History of PSVT -Fairly controlled, patient does have extensive allergy to beta-blocker and calcium channel blocker and has been on a regimen of flecainide and clonidine regimen for rate control. -Continue with ASA -As well these events were unrelated to any  arrhythmias or sinus tachycardia. -Was noted to have some episodes of sinus arrhythmias, I have discussed with EP cardiology upon husband's request, I reviewed her telemetry, and admission, at this point no further recommendations, they will arrange for outpatient follow-up on 6/28.   Migraines -Continue Topamax and as needed sumatriptan     DVT prophylaxis: Lovenox Code Status: Full Family Communication: Discussed with husband at bedside 6/19 Disposition:      Consultants:  Neurology Palliative   Subjective:  Reports another episode in the morning, I have notified by the nurse patient had another episode where she was tachypneic, minimally responsive, she is currently back to her baseline.  Objective: Vitals:   01/05/22 0506 01/05/22 1030 01/05/22 1045 01/05/22 1100  BP: (!) 104/55 95/66 117/80 112/69  Pulse: 78  96 92  Resp: 15 (!) 33 20 17  Temp: 98.1 F (36.7 C)   98.3 F (36.8 C)  TempSrc: Oral   Oral  SpO2: 98%  97% 99%  Weight:      Height:        Intake/Output Summary (Last 24 hours) at 01/05/2022 1444 Last data filed at 01/05/2022 [id number] Gross per 24 hour  Intake 180 ml  Output 500 ml  Net -320 ml   Filed Weights   01/02/22 0939 01/02/22 2242  Weight: 58.3 kg 58 kg    Examination:  Sleeping comfortably, but wakes up, answering questions appropriately, speaking in low tone voice Symmetrical Chest wall movement, Good air movement bilaterally, CTAB RRR,No Gallops,Rubs or new Murmurs, No Parasternal Heave +ve B.Sounds, Abd Soft, No tenderness, No rebound - guarding or rigidity. No Cyanosis, Clubbing or edema, No new Rash or bruise  Patient was seen with female chaperone her RN Marchelle Folks at bedside  Data Reviewed: I have personally reviewed following labs and imaging studies  CBC: Recent Labs  Lab 12/30/21 1058 01/02/22 1056  WBC 6.5 8.3  NEUTROABS 4.5 6.6  HGB 13.2 14.3  HCT 39.3 41.5  MCV 94.7 91.6  PLT 132* 164    Basic Metabolic  Panel: Recent Labs  Lab 12/30/21 1058 12/30/21 1924 01/02/22 1056  NA 138  --  135  K 4.0  --  3.5  CL 109  --  108  CO2 24  --  21*  GLUCOSE 95  --  95  BUN 17  --  18  CREATININE 0.97  --  0.94  CALCIUM 9.1  --  9.1  MG  --  2.1 2.0    GFR: Estimated Creatinine Clearance: 67.1 mL/min (by C-G formula based on SCr of 0.94 mg/dL).  Liver Function Tests: Recent Labs  Lab 12/30/21 1058 01/02/22 1056  AST 16 16  ALT 14 13  ALKPHOS 33* 34*  BILITOT 0.5 0.6  PROT 7.0 7.2  ALBUMIN 4.1 4.1    CBG: Recent Labs  Lab 01/02/22 1029  GLUCAP 89     No results found for this or any previous visit (from the past 240 hour(s)).       Radiology Studies: Overnight EEG with video  Result Date: 01/04/2022 Charlsie Quest, MD     01/05/2022  9:26 AM Patient Name: Tara Mahoney MRN: [id number] Epilepsy Attending: Charlsie Quest Referring Physician/Provider: Elmer Picker, NP Duration: 01/03/2022 1108 to 01/04/2022 1108  Patient history: 36yo F with seizure liek activity. EEG to evaluate for seizure.  Level of alertness: Awake, asleep  AEDs during EEG study: TPM  Technical aspects: This EEG study was done with scalp electrodes positioned according to the 10-20 International system of electrode placement. Electrical activity was acquired at a sampling rate of 500Hz  and reviewed with a high frequency filter of 70Hz  and a low frequency filter of 1Hz . EEG data were recorded continuously and digitally stored.  Description: The posterior dominant rhythm consists of 10 Hz activity of moderate voltage (25-35 uV) seen predominantly in posterior head regions, symmetric and reactive to eye opening and eye closing. Sleep was characterized by vertex waves, sleep spindles (12 to 14 Hz), maximal frontocentral region. Hyperventilation and photic stimulation were not performed.   Event button was pressed on 01/03/2022 at 1112 and on 01/04/2022 at 0814. Patient was laying in bed with eyes closed,  hyperventilating, bilateral upper extremity tremor-like movements, not responding.  Concomitant EEG before, during and after the event showed normal posterior dominant rhythm.  IMPRESSION: This study is within normal limits. No seizures or epileptiform discharges were seen throughout the recording. Two events were recorded as described above without concomitant EEG change.  These events were NON-epileptic.   Priyanka         Scheduled Meds:  aspirin EC  81 mg Oral Daily   cloNIDine  0.1 mg Transdermal Q Fri   enoxaparin (LOVENOX) injection  40 mg Subcutaneous Q24H   feeding supplement  1 Container Oral TID BM   flecainide  50 mg Oral BID   FLUoxetine  20 mg Oral Daily   LORazepam  1 mg Intravenous Once   magnesium oxide  400 mg Oral Daily   multivitamin with minerals  1 tablet Oral Daily   simvastatin  10 mg Oral QHS   topiramate  100 mg Oral BID   Continuous  Infusions:  sodium chloride 75 mL/hr (01/04/22 2256)     LOS: 1 day      Huey Bienenstock, MD [facility]   To contact the attending provider between 7A-7P or the covering provider during after hours 7P-7A, please log into the web site [email or web address] and access using universal [place] password for that web site. If you do not have the password, please call the hospital operator.  01/05/2022, 2:44 PM
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
## 2022-01-05 NOTE — Procedures (Signed)
Patient Name: Tara Mahoney  MRN: [id number]  Epilepsy Attending: Charlsie Quest  Referring Physician/Provider: Elmer Picker, NP  Duration: 01/04/2022 1108 to 01/05/2022 1108   Patient history: 36yo F with seizure liek activity. EEG to evaluate for seizure.   Level of alertness: Awake, asleep   AEDs during EEG study: TPM   Technical aspects: This EEG study was done with scalp electrodes positioned according to the 10-20 International system of electrode placement. Electrical activity was acquired at a sampling rate of 500Hz  and reviewed with a high frequency filter of 70Hz  and a low frequency filter of 1Hz . EEG data were recorded continuously and digitally stored.    Description: The posterior dominant rhythm consists of 10 Hz activity of moderate voltage (25-35 uV) seen predominantly in posterior head regions, symmetric and reactive to eye opening and eye closing. Sleep was characterized by vertex waves, sleep spindles (12 to 14 Hz), maximal frontocentral region. Hyperventilation and photic stimulation were not performed.      Event button was pressed on 01/04/2022 at 1708. Patient was laying in bed with eyes closed, had side to side head movement, eyes closed, not responding to RN. Concomitant EEG before, during and after the event showed normal posterior dominant rhythm.    Event button was pressed on 01/05/2022 at 1030. Patient was laying in bed with eyes closed, not responding to RN. Concomitant EEG before, during and after the event showed normal posterior dominant rhythm.     IMPRESSION: This study is within normal limits. No seizures or epileptiform discharges were seen throughout the recording.   Two events were recorded as described above without concomitant EEG change.  These events were NON-epileptic.   Naveen Lorusso 

## 2022-01-05 NOTE — Significant Event (Signed)
Rapid Response Event Note   Reason for Call :  Seizure like activity with RR 30-50 and HR elevated. Pt not responsive  Initial Focused Assessment:  On arrival, VSS- HR 96, BP 117/80, RR 20, spO2 97% on RA. Pt not opening eyes to command but will intermittently fight to keep them closed while assessing. Pupils +4-5 brisk, able to squeeze my hand with weak grip bilaterally. Pt moving BLE while attempting to remove blanket but when asked will not move or follow commands.    Interventions:  No meds given, treatment team made aware. Md ordered ABG when RN initially called about tachypnea. Not sure this is needed at this time.   Plan of Care:  Continue to monitor pt. Maintain safety/seizure precautions.  RN instructed to call with any changes or concerns.    Event Summary:   MD Notified: 1045 Call Time: 1030 Arrival Time: 1040 End Time:1100  Mordecai Rasmussen, RN

## 2022-01-05 NOTE — Progress Notes (Signed)
[facility] night cross cover note:   I was notified by RN that the patient exhibited an episode of tonic-clonic activity shortly after 1900 on 01/04/2022.  Episode was self-limited, lasting for less than 5 minutes, terminating without prn Ativan.  Patient now back to baseline mental status.  Vital signs stable.  Of note, continuous EEG monitoring was active at the time of this episode.     Newton Pigg, DO Hospitalist

## 2022-01-05 NOTE — Discharge Instructions (Addendum)
From the psychiatry team: Please follow up with [facility] PHP program, located in [place]. I called them on your behalf and they should be reaching out to you on 6/21. Please call them at the number below if you do not get a call.  [phone] [address], [place], [place] [address]  Here are providers you can follow up with in [place] county:  [facility] at [facility] [address] [place]  [place], [place] [address] [phone]  Carlyn Reichert, MD    You are welcome to read the book "the body says no" by Gabor Mate.

## 2022-01-05 NOTE — Progress Notes (Addendum)
Subjective: Had 2 more seizure-like episodes overnight.  Was tearful this morning.  Was initially not responding to me but then started nodding appropriately and said talking to the psychiatrist helped.  ROS: negative except above  Examination  Vital signs in last 24 hours: Temp:  [98.1 F (36.7 C)-99.6 F (37.6 C)] 98.3 F (36.8 C) (06/20 1100) Pulse Rate:  [78-100] 92 (06/20 1100) Resp:  [15-33] 17 (06/20 1100) BP: (95-117)/(55-81) 112/69 (06/20 1100) SpO2:  [97 %-100 %] 99 % (06/20 1100)  General: lying in bed, NAD Neuro: MS: Alert, oriented, follows commands CN: pupils equal and reactive,  EOMI, face symmetric, tongue midline, normal sensation over face, Motor: 5/5 strength in all 4 extremities  Basic Metabolic Panel: Recent Labs  Lab 12/30/21 1058 12/30/21 1924 01/02/22 1056  NA 138  --  135  K 4.0  --  3.5  CL 109  --  108  CO2 24  --  21*  GLUCOSE 95  --  95  BUN 17  --  18  CREATININE 0.97  --  0.94  CALCIUM 9.1  --  9.1  MG  --  2.1 2.0    CBC: Recent Labs  Lab 12/30/21 1058 01/02/22 1056  WBC 6.5 8.3  NEUTROABS 4.5 6.6  HGB 13.2 14.3  HCT 39.3 41.5  MCV 94.7 91.6  PLT 132* 164     Coagulation Studies: No results for input(s): "LABPROT", "INR" in the last 72 hours.  Imaging No new brain imaging overnight  ASSESSMENT AND PLAN: 36 year old female with multiple medical comorbidities presented with new onset seizure-like episodes.   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 -Discussed the diagnosis of nonepileptic events and coping strategies with patient again today.  She expressed understanding and was in agreement with following up with psychiatry -We do not need to add any further antiseizure medication for these episodes -Avoid Ativan for these episodes  unless it is affecting vital signs.  Of note patient already has intermittent tachycardia due to POTS and other cardiac issues -Discussed seizure precautions including do not drive -Discussed plan with patient's RN   Seizure precautions: Per [facility] statutes, patients with seizures are not allowed to drive until they have been seizure-free for six months and cleared by a physician    Use caution when using heavy equipment or power tools. Avoid working on ladders or at heights. Take showers instead of baths. Ensure the water temperature is not too high on the home water heater. Do not go swimming alone. Do not lock yourself in a room alone (i.e. bathroom). When caring for infants or small children, sit down when holding, feeding, or changing them to minimize risk of injury to the child in the event you have a seizure. Maintain good sleep hygiene. Avoid alcohol.    If patient has another seizure, call 911 and bring them back to the ED if: A.  The seizure lasts longer than 5 minutes.      B.  The patient doesn't wake shortly after the seizure or has new problems such as difficulty seeing, speaking or moving following the seizure C.  The patient was injured during the seizure D.  The patient has a temperature over 102 F (39C) E.  The patient vomited during the seizure and now is having trouble breathing    During the Seizure   -  First, ensure adequate ventilation and place patients on the floor on their left side  Loosen clothing around the neck and ensure the airway is patent. If the patient is clenching the teeth, do not force the mouth open with any object as this can cause severe damage - Remove all items from the surrounding that can be hazardous. The patient may be oblivious to what's happening and may not even know what he or she is doing. If the patient is confused and wandering, either gently guide him/her away and block access to outside areas - Reassure the individual and be  comforting - Call 911. In most cases, the seizure ends before EMS arrives. However, there are cases when seizures may last over 3 to 5 minutes. Or the individual may have developed breathing difficulties or severe injuries. If a pregnant patient or a person with diabetes develops a seizure, it is prudent to call an ambulance. - Finally, if the patient does not regain full consciousness, then call EMS. Most patients will remain confused for about 45 to 90 minutes after a seizure, so you must use judgment in calling for help.    After the Seizure (Postictal Stage)   After a seizure, most patients experience confusion, fatigue, muscle pain and/or a headache. Thus, one should permit the individual to sleep. For the next few days, reassurance is essential. Being calm and helping reorient the person is also of importance.   Most seizures are painless and end spontaneously. Seizures are not harmful to others but can lead to complications such as stress on the lungs, brain and the heart. Individuals with prior lung problems may develop labored breathing and respiratory distress.      I have spent a total of 37 minutes with the patient reviewing hospital notes,  test results, labs and examining the patient as well as establishing an assessment and plan that was discussed personally with the patient.  > 50% of time was spent in direct patient care.   Lindie Spruce Epile[facility] For questions after 5pm please refer to AMION to reach the Neurologist on call

## 2022-01-05 NOTE — Progress Notes (Signed)
Rapid Response called at 10:28.  Patient was noted to have respirations 38-50 starting around 10:25. No tonic-clonic activity noted just rapid breathing and the patient was unable to rouse.  Primary physician notified and neurology notified as well.

## 2022-01-06 DIAGNOSIS — F445 Conversion disorder with seizures or convulsions: Secondary | ICD-10-CM | POA: Diagnosis not present

## 2022-01-06 NOTE — Progress Notes (Signed)
Patient given discharge instructions but still awaiting MD evaluation.

## 2022-01-06 NOTE — Plan of Care (Signed)
Came to see patient this morning accompanied by Dr. Jolene Provost to see if the patient was interested in engaging in therapy.  She reported that she was.  Therapy utilized cognitive behavioral and solution focused methods.  The patient was able to describe that 3 to 4 weeks ago she was managing her stress well.  She reports that her husband's daughter "Reagan", "messed things up".  She describes the previously documented account of [employer]PS taking Reagan's daughter away from her.  The patient is able to relate what her life would look like if it were better.  She states that she would be listening to 50s music more, spending more time with her kids, and time with her dogs.  Stress management strategies were discussed as well as positive coping skills.  Patient was able to name that one of her positive coping skills right now is going to talk to her neighbor.  She also states that she plans to "remove toxic people from my life".  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  Encouraged patient to attend follow-up appointment with [facility].   Carlyn Reichert, MD PGY-1

## 2022-01-07 NOTE — Discharge Summary (Signed)
Physician Discharge Summary   Patient: Tara Mahoney MRN: [id number] DOB: 1985/08/28  Admit date:     01/02/2022  Discharge date: 01/06/2022  Discharge Physician: Jacquelin Hawking   PCP: Ponciano Ort, [facility]   Recommendations at discharge:  Cardiology, PCP and behavioral health follow-up  Discharge Diagnoses: Principal Problem:   Psychogenic nonepileptic seizure Active Problems:   Nonsustained ventricular tachycardia (HCC)   Intractable chronic migraine without aura and without status migrainosus   Malnutrition of moderate degree  Resolved Problems:   * No resolved hospital problems. *  Hospital Course: heryl Mahoney is a 36 y.o. female with medical history significant of PSVT, intractable migraines, HLD, presented with initial concern for new onset of seizure.  Assessment and Plan:  Psychogenic nonepileptic seizure LTM EEG was performed and captured episodes of seizure-like activity; EEG negative for evidence of epileptic seizures.  Concern for psychogenic nonepileptic seizure, so psychiatry was consulted.  Psychiatry evaluated and recommended psychology follow-up.  Seizure activity likely secondary to recent stress.  Patient received psychotherapy prior to discharge.  Patient to follow-up with psychology as an outpatient.  Patient given driving action relating to driving restrictions for the next 6 months.  History of PSVT Continue flecainide and clonidine. Patient with sinus arrhythmias while inpatient. Case discussed with EP with recommendation for outpatient follow-up.  Migraines Continue Topamax and sumatriptan as needed.      Consultants: Neurology, psychiatry Procedures performed: LTM EEG Disposition: Home Diet recommendation: Regular diet  DISCHARGE MEDICATION: Allergies as of 01/06/2022       Reactions   Calan [verapamil] Other (See Comments)   Racing heart Dizzyness Headaches Body tremors   Coreg [carvedilol] Hives, Itching   Definity [perflutren Lipid  Microsphere] Other (See Comments)   Headache   Morphine And Related Nausea And Vomiting   Norco [hydrocodone-acetaminophen] Itching, Nausea And Vomiting   Beta Adrenergic Blockers Hives, Itching, Nausea And Vomiting, Rash   Pt has reactions to: Atenolol Carvedilol Metoprolol Nebivolol    Bystolic [nebivolol Hcl] Rash      Cardizem Cd [diltiazem Hcl Er Beads] Rash   Tenormin [atenolol] Itching, Rash   Toprol Xl [metoprolol] Rash   Headaches Dizziness         Medication List     STOP taking these medications    NON FORMULARY       TAKE these medications    aspirin 81 MG chewable tablet Chew 1 tablet (81 mg total) by mouth daily.   cloNIDine 0.1 mg/24hr patch Commonly known as: CATAPRES - Dosed in mg/24 hr Place 1 patch (0.1 mg total) onto the skin once a week.   flecainide 50 MG tablet Commonly known as: TAMBOCOR Take 1 tablet (50 mg total) by mouth 2 (two) times daily.   FLUoxetine 20 MG capsule Commonly known as: PROZAC TAKE 1 CAPSULE BY MOUTH EVERY DAY What changed: how much to take   Magnesium Oxide 400 MG Caps Take 1 capsule (400 mg total) by mouth daily.   multivitamin with minerals Tabs tablet Take 1 tablet by mouth daily.   simvastatin 10 MG tablet Commonly known as: ZOCOR Take 10 mg by mouth at bedtime.   SUMAtriptan 50 MG tablet Commonly known as: IMITREX Take 2 tablets (100 mg total) by mouth every 2 (two) hours as needed for migraine. May repeat in 2 hours if headache persists or recurs.  Max dose 200 mg/day   topiramate 100 MG tablet Commonly known as: TOPAMAX Take 1 tablet (100 mg total) by mouth 2 (two) times  daily.        Follow-up Information     Duke Salvia, MD Follow up.   Specialty: Cardiology Why: 01/13/22 @ 2:15PM Contact information: [address] [place] [address] [phone]                Discharge Exam: BP (!) 100/59 (BP Location: Right Arm)   Pulse 79   Temp 98.1 F (36.7 C)  (Oral)   Resp 16   Ht 5\' 3"  (1.6 m)   Wt 58 kg   LMP 05/04/2011   SpO2 96%   BMI 22.65 kg/m   General exam: Appears calm and comfortable Respiratory system: Clear to auscultation. Respiratory effort normal. Cardiovascular system: S1 & S2 heard, RRR. No murmurs, rubs, gallops or clicks. Gastrointestinal system: Abdomen is nondistended, soft and nontender. No organomegaly or masses felt. Normal bowel sounds heard. Central nervous system: Alert and oriented. No focal neurological deficits. Musculoskeletal: No edema. No calf tenderness Skin: No cyanosis. No rashes Psychiatry: Judgement and insight appear normal. Mood & affect appropriate.   Condition at discharge: stable  The results of significant diagnostics from this hospitalization (including imaging, microbiology, ancillary and laboratory) are listed below for reference.   Imaging Studies: Overnight EEG with video  Result Date: 01/04/2022 01/06/2022, MD     01/05/2022  9:26 AM Patient Name: Tara Mahoney MRN: Corliss Marcus Epilepsy Attending: [id number] Referring Physician/Provider: Charlsie Quest, NP Duration: 01/03/2022 1108 to 01/04/2022 1108  Patient history: 36yo F with seizure liek activity. EEG to evaluate for seizure.  Level of alertness: Awake, asleep  AEDs during EEG study: TPM  Technical aspects: This EEG study was done with scalp electrodes positioned according to the 10-20 International system of electrode placement. Electrical activity was acquired at a sampling rate of 500Hz  and reviewed with a high frequency filter of 70Hz  and a low frequency filter of 1Hz . EEG data were recorded continuously and digitally stored.  Description: The posterior dominant rhythm consists of 10 Hz activity of moderate voltage (25-35 uV) seen predominantly in posterior head regions, symmetric and reactive to eye opening and eye closing. Sleep was characterized by vertex waves, sleep spindles (12 to 14 Hz), maximal frontocentral region.  Hyperventilation and photic stimulation were not performed.   Event button was pressed on 01/03/2022 at 1112 and on 01/04/2022 at 0814. Patient was laying in bed with eyes closed, hyperventilating, bilateral upper extremity tremor-like movements, not responding.  Concomitant EEG before, during and after the event showed normal posterior dominant rhythm.  IMPRESSION: This study is within normal limits. No seizures or epileptiform discharges were seen throughout the recording. Two events were recorded as described above without concomitant EEG change.  These events were NON-epileptic.      EEG adult  Result Date: 01/03/2022 01/05/2022, MD     01/03/2022  4:28 PM Patient Name: Averee Harb MRN: 01/05/2022 Epilepsy Attending: Charlsie Quest Referring Physician/Provider: 01/05/2022, NP Date: 01/03/2022 Duration: 23.15 mins Patient history: 36yo F with seizure liek activity. EEG to evaluate for seizure. Level of alertness: Awake, asleep AEDs during EEG study: None Technical aspects: This EEG study was done with scalp electrodes positioned according to the 10-20 International system of electrode placement. Electrical activity was acquired at a sampling rate of 500Hz  and reviewed with a high frequency filter of 70Hz  and a low frequency filter of 1Hz . EEG data were recorded continuously and digitally stored. Description: The posterior dominant rhythm consists of  10 Hz activity of moderate voltage (25-35 uV) seen predominantly in posterior head regions, symmetric and reactive to eye opening and eye closing. Sleep was characterized by vertex waves, sleep spindles (12 to 14 Hz), maximal frontocentral region.  There is an excessive amount of 15 to 18 Hz beta activity distributed symmetrically and diffusely.  Hyperventilation and photic stimulation were not performed.   ABNORMALITY - Excessive beta, generalized IMPRESSION: This study is within normal limits. The excessive beta activity seen in the  background is most likely due to the effect of benzodiazepine and is a benign EEG pattern. No seizures or epileptiform discharges were seen throughout the recording. Charlsie Questriyanka O Yadav   CT ANGIO HEAD NECK W WO CM  Result Date: 01/02/2022 CLINICAL DATA:  Neuro deficit, acute, stroke suspected. Patient is nonresponsive. EXAM: CT ANGIOGRAPHY HEAD AND NECK TECHNIQUE: Multidetector CT imaging of the head and neck was performed using the standard protocol during bolus administration of intravenous contrast. Multiplanar CT image reconstructions and MIPs were obtained to evaluate the vascular anatomy. Carotid stenosis measurements (when applicable) are obtained utilizing NASCET criteria, using the distal internal carotid diameter as the denominator. RADIATION DOSE REDUCTION: This exam was performed according to the departmental dose-optimization program which includes automated exposure control, adjustment of the mA and/or kV according to patient size and/or use of iterative reconstruction technique. CONTRAST:  <See Chart> OMNIPAQUE IOHEXOL 350 MG/ML SOLN COMPARISON:  CTA head and neck 01/02/2022 at 12 o'clock p.m. MRA head and neck 12/31/2021. FINDINGS: CT HEAD FINDINGS Brain: No acute infarct, hemorrhage, or mass lesion is present. No significant white matter lesions are present. The ventricles are of normal size. No significant extraaxial fluid collection is present. The brainstem and cerebellum are within normal limits. Vascular: No hyperdense vessel or unexpected calcification. Skull: Calvarium is intact. No focal lytic or blastic lesions are present. No significant extracranial soft tissue lesion is present. Sinuses: The paranasal sinuses and mastoid air cells are clear. Orbits: The globes and orbits are within normal limits. Review of the MIP images confirms the above findings CTA NECK FINDINGS Aortic arch: 3 vessel arch configuration is present. No significant stenosis or aneurysm. Right carotid system: The right  common carotid artery is within normal limits. Bifurcation is unremarkable. Cervical right ICA is normal. Left carotid system: The left common carotid artery is within normal limits. Bifurcation is unremarkable. Cervical left ICA is normal. Vertebral arteries: Vertebral arteries are codominant. Both vertebral arteries originate from the subclavian arteries without significant stenosis. Skeleton: No focal osseous lesions. Other neck: Soft tissues the neck are otherwise unremarkable. Salivary glands are within normal limits. Thyroid is normal. No significant adenopathy is present. No focal mucosal or submucosal lesions are present. Upper chest: Lung apices are clear. Thoracic inlet is within normal limits. Review of the MIP images confirms the above findings CTA HEAD FINDINGS Anterior circulation: The internal carotid arteries are within normal limits from the skull base through the ICA termini. The A1 and M1 segments are normal. The anterior communicating artery is patent. MCA bifurcations are intact. The ACA and MCA branch vessels are normal. Posterior circulation: The vertebral arteries are codominant. PICA origins are visualized and normal. The left posterior cerebral artery originates from basilar tip. The right posterior cerebral artery receives scratched at the right posterior cerebral artery is of fetal type with contribution from a small right P1 segment. PCA branch vessels are within normal limits bilaterally. Venous sinuses: The dural sinuses are patent. The straight sinus deep cerebral veins are intact.  Cortical veins are within normal limits. No significant vascular malformation is evident. Anatomic variants: None Review of the MIP images confirms the above findings IMPRESSION: 1. Normal variant CTA Circle of Willis without significant proximal stenosis, aneurysm, or branch vessel occlusion. 2. Normal CTA of the neck. 3. Normal noncontrast CT of the head. No acute or focal lesion to explain the patient's  symptoms. Electronically Signed   By: Marin Roberts M.D.   On: 01/02/2022 18:45   CT ANGIO HEAD NECK W WO CM  Result Date: 01/02/2022 CLINICAL DATA:  36 year old female with recent seizure. 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 Unrevealing MRA. EXAM: CT ANGIOGRAPHY HEAD AND NECK TECHNIQUE: Multidetector CT imaging of the head and neck was performed using the standard protocol during bolus administration of intravenous contrast. Multiplanar CT image reconstructions and MIPs were obtained to evaluate the vascular anatomy. Carotid stenosis measurements (when applicable) are obtained utilizing NASCET criteria, using the distal internal carotid diameter as the denominator. RADIATION DOSE REDUCTION: This exam was performed according to the departmental dose-optimization program which includes automated exposure control, adjustment of the mA and/or kV according to patient size and/or use of iterative reconstruction technique. CONTRAST:  30mL OMNIPAQUE IOHEXOL 350 MG/ML SOLN COMPARISON:  MRA head and neck 12/31/2021. Head CT 08/13/2020. FINDINGS: CT HEAD Brain: Stable since January and negative noncontrast CT appearance of the brain. Small chronic left posterior cerebellar infarct essentially occult by CT (series 5, image 26). Calvarium and skull base: Negative. Paranasal sinuses: Visualized paranasal sinuses and mastoids are stable and well aerated. Orbits: Leftward gaze, otherwise negative orbits and scalp. CTA NECK Skeleton: Reversal of the normal cervical lordosis, but otherwise negative. No acute osseous abnormality identified. Upper chest: Negative. Other neck: Negative. Aortic arch: Negative. Three vessel arch configuration. Right carotid system: Negative. Left carotid system: Proximal left CCA partially obscured by dense adjacent venous contrast streak artifact. Otherwise negative. Vertebral arteries: Negative; some proximal vertebral artery detail mildly obscured by dense adjacent venous  contrast reflux. CTA HEAD Posterior circulation: Codominant distal vertebral arteries appear normal to the vertebrobasilar junction. Patent PICA origins. Patent basilar artery without stenosis. Fetal type right PCA origin. Patent basilar tip, SCA and left PCA origins. Bilateral PCA branches are within normal limits. Anterior circulation: Both ICA siphons are patent without stenosis. Normal right posterior communicating artery origin. Patent carotid termini, MCA and ACA origins. Diminutive or absent anterior communicating artery. Bilateral ACA branches are within normal limits. Left MCA M1 segment and bifurcation are patent without stenosis. Right MCA M1 segment and bifurcation are patent without stenosis. Bilateral MCA branches are within normal limits. Venous sinuses: Patent. Anatomic variants: Fetal type right PCA origin. Review of the MIP images confirms the above findings IMPRESSION: 1. Normal CTA Head and Neck. 2.  No acute intracranial abnormality. Electronically Signed   By: Odessa Fleming M.D.   On: 01/02/2022 12:19   EEG adult  Result Date: 12/31/2021 Charlsie Quest, MD     12/31/2021  6:21 PM Patient Name: Yanel Dombrosky MRN: [id number] Epilepsy Attending: Charlsie Quest Referring Physician/Provider: Catarina Hartshorn, MD Date: 12/31/2021 Duration: 23.08 mins Patient history: 36yo F with transient alteration of awareness. EEG to evaluate for seizure. Level of alertness: Awake AEDs during EEG study: TPM Technical aspects: This EEG study was done with scalp electrodes positioned according to the 10-20 International system of electrode placement. Electrical activity was acquired at a sampling rate of 500Hz  and reviewed with a high frequency filter of 70Hz  and a low frequency filter  of 1Hz . EEG data were recorded continuously and digitally stored. Description: The posterior dominant rhythm consists of 10 Hz activity of moderate voltage (25-35 uV) seen predominantly in posterior head regions, symmetric and reactive to  eye opening and eye closing. Hyperventilation and photic stimulation were not performed.   IMPRESSION: This study is within normal limits. No seizures or epileptiform discharges were seen throughout the recording.   MR ANGIO NECK W WO CONTRAST  Result Date: 12/31/2021 CLINICAL DATA:  36 year old female with seizure, Brain MRI yesterday reveals small chronic infarct in the posterior left cerebellum. EXAM: MRA NECK WITHOUT AND WITH CONTRAST TECHNIQUE: Multiplanar and multiecho pulse sequences of the neck were obtained without and with intravenous contrast. Angiographic images of the neck were obtained using MRA technique without and with intravenous contrast. CONTRAST:  52mL GADAVIST GADOBUTROL 1 MMOL/ML IV SOLN COMPARISON:  Brain MRI yesterday.  Intracranial MRA today. FINDINGS: Precontrast time-of-flight neck MRA reveals antegrade flow in both cervical carotid and vertebral arteries. Carotid bifurcations appear normal. Post-contrast neck MRA reveals a 3 vessel arch configuration. Proximal great vessels appear normal. Bilateral Common carotid arteries and carotid bifurcations appear normal. Bilateral cervical ICAs appear normal. And visible bilateral ICA siphons and termini are patent. Normal proximal subclavian arteries. Codominant vertebral arteries. Both vertebral artery origins appear normal. No evidence of vertebral artery stenosis in the neck or the posterior fossa. Patent vertebrobasilar junction. Visible basilar artery appears normal. IMPRESSION: Negative Neck MRA. Electronically Signed   By: 11m M.D.   On: 12/31/2021 11:16   MR ANGIO HEAD WO CONTRAST  Result Date: 12/31/2021 CLINICAL DATA:  36 year old female with seizure, Brain MRI yesterday reveals small chronic infarct in the posterior left cerebellum. EXAM: MRA HEAD WITHOUT CONTRAST TECHNIQUE: Angiographic images of the Circle of Willis were acquired using MRA technique without intravenous contrast. COMPARISON:  Brain MRI  12/30/2021. FINDINGS: Anterior circulation: Antegrade flow in both ICA siphons. No siphon stenosis. Normal ophthalmic and right posterior communicating artery origins. Patent carotid termini, MCA and ACA origins. Normal anterior communicating artery. Bilateral ACA branches are within normal limits. MCA M1 segments and MCA bifurcations are patent without stenosis. Visible bilateral MCA branches appear normal. Posterior circulation: Antegrade flow in the posterior circulation with codominant distal vertebral arteries. No distal vertebral or vertebrobasilar junction stenosis. Both PICA are patent. Patent basilar artery without stenosis. Patent AICA, SCA, and PCA origins. Fetal type right PCA origin. Left posterior communicating artery diminutive or absent. Bilateral PCA branches are within normal limits. Anatomic variants: Fetal type right PCA origin. Other: No intracranial mass effect or ventriculomegaly. IMPRESSION: Negative intracranial MRA. Electronically Signed   By: 01/01/2022 M.D.   On: 12/31/2021 11:14   DG Chest 2 View  Result Date: 12/30/2021 CLINICAL DATA:  Ventricular tachycardia EXAM: CHEST - 2 VIEW COMPARISON:  None Available. FINDINGS: The heart size and mediastinal contours are within normal limits. Both lungs are clear. The visualized skeletal structures are unremarkable. IMPRESSION: No active cardiopulmonary disease. Electronically Signed   By: 01/01/2022 M.D.   On: 12/30/2021 23:44   MR BRAIN WO CONTRAST  Result Date: 12/30/2021 CLINICAL DATA:  Provided history: Seizure, new onset, no history of trauma. Head CT 08/13/2020. EXAM: MRI HEAD WITHOUT CONTRAST TECHNIQUE: Multiplanar, multiecho pulse sequences of the brain and surrounding structures were obtained without intravenous contrast. COMPARISON:  None Available. FINDINGS: Brain: Cerebral volume is normal. 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 No  cortical  encephalomalacia is identified. No significant cerebral white matter disease. No appreciable hippocampal size or signal asymmetry. There is no acute infarct. No evidence of an intracranial mass. No chronic intracranial blood products. No extra-axial fluid collection. No midline shift. Vascular: Maintained flow voids within the proximal large arterial vessels. Skull and upper cervical spine: No focal suspicious marrow lesion. Sinuses/Orbits: No mass or acute finding within the imaged orbits. Mild mucosal thickening within the bilateral ethmoid and maxillary sinuses. Other: Trace fluid within the left mastoid air cells. IMPRESSION: 1. No evidence of acute intracranial abnormality. 2. No specific seizure focus is identified. 3. 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 4. Otherwise unremarkable non-contrast MRI appearance of the brain. 5. Mild mucosal thickening within the bilateral ethmoid and maxillary sinuses. 6. Trace fluid within the left mastoid air cells. Electronically Signed   By: Jackey Loge D.O.   On: 12/30/2021 17:04    Microbiology: Results for orders placed or performed during the hospital encounter of 08/03/12  Surgical pcr screen     Status: Abnormal   Collection Time: 08/03/12 10:50 AM  Result Value Ref Range Status   MRSA, PCR NEGATIVE NEGATIVE Final   Staphylococcus aureus POSITIVE (A) NEGATIVE Final    Comment:        The Xpert SA Assay (FDA approved for NASAL specimens in patients over 15 years of age), is one component of a comprehensive surveillance program.  Test performance has been validated by [employer] for patients greater than or equal to 69 year old. It is not intended to diagnose infection nor to guide or monitor treatment. RESULT CALLED TO, READ BACK BY AND VERIFIED WITH: CANADY,K. AT 1355 ON 08/03/2012 BY BAUGHAM,M.    Labs: CBC: Recent Labs  Lab 01/02/22 1056  WBC 8.3  NEUTROABS 6.6  HGB 14.3  HCT 41.5  MCV 91.6   PLT 164   Basic Metabolic Panel: Recent Labs  Lab 01/02/22 1056  NA 135  K 3.5  CL 108  CO2 21*  GLUCOSE 95  BUN 18  CREATININE 0.94  CALCIUM 9.1  MG 2.0   Liver Function Tests: Recent Labs  Lab 01/02/22 1056  AST 16  ALT 13  ALKPHOS 34*  BILITOT 0.6  PROT 7.2  ALBUMIN 4.1   CBG: Recent Labs  Lab 01/02/22 1029  GLUCAP 89    Discharge time spent: 35 minutes.  Signed: Jacquelin Hawking, MD [facility] 01/07/2022

## 2022-01-12 ENCOUNTER — Ambulatory Visit: Payer: BC Managed Care – PPO | Admitting: Neurology

## 2022-01-12 ENCOUNTER — Encounter: Payer: Self-pay | Admitting: Neurology

## 2022-01-12 VITALS — BP 95/63 | HR 90 | Ht 63.0 in | Wt 125.8 lb

## 2022-01-12 DIAGNOSIS — F445 Conversion disorder with seizures or convulsions: Secondary | ICD-10-CM

## 2022-01-12 MED ORDER — LORAZEPAM 0.5 MG PO TABS: ORAL_TABLET | ORAL | 5 refills | Status: AC

## 2022-01-12 MED ORDER — FLUOXETINE HCL 40 MG PO CAPS: 40.0000 mg | ORAL_CAPSULE | Freq: Every day | ORAL | 11 refills | Status: DC

## 2022-01-12 NOTE — Progress Notes (Signed)
NEUROLOGY CONSULTATION NOTE  Tara Mahoney MRN: [id number] DOB: 06-30-1986  Referring provider: Dr. Onalee Hua Tat Primary care provider: [facility]  Reason for consult:  syncope  Dear Dr Tat:  Thank you for your kind referral of Tara Mahoney for consultation of the above symptoms. Although her history is well known to you, please allow me to reiterate it for the purpose of our medical record.She is alone in the office today. Records and images were personally reviewed where available.   HISTORY OF PRESENT ILLNESS: This is a 36 year old ambidextrous woman with history of migraines, NSVT, dysautonomia, in her usual state of health until 12/30/21 when she had an episode of loss of consciousness. She was helping her husband clean and vacuum their pool then after she felt palpitations and shortness of breath. She sat down and head slumped to the side. She woke up in the ambulance, no tongue bite or incontinence. Per notes, daughter saw some jerking activity while she was unconscious. Her spouse checked her glucose level which was 66. Per EMS, her HR was stable initially then she started to become very panicked and cry, HR increased to 140s. BG 164. She reported shortness of breath and palpitations when she woke up in the ER. She had another syncopal episode 1.5 years prior. She is on clonidine for tachycardia and follows with Cardiology. She had a brain MRI without contrast which did not show any acute changes. 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 MRA head and neck no significant stenosis. Routine EEG was normal. She was started on a daily baby aspirin. She was back in the hospital on 01/02/22 after she had an episode of unresponsiveness with whole body shaking. HR 130s on EMS arrival. She had more episodes in the ER with jaw clenched and unresponsive. She remained awake but was non-verbal. MRI brain no acute changes. Prolonged  48-hour vEEG done showed normal baseline EEG. There were 4 episodes with eyes closed, hyperventilation, bilateral upper extremity tremor-like movements, with unresponsiveness captured. No EEG correlate seen. 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  As I entered the exam room, her husband reported she was having an episode and that she had several more earlier in the day. She is sitting on the chair with eyes closed, head moving side to side, unresponsive. She then started having irregular shaking of the right arm. After a few seconds, she started opening her eyes and answering questions. He reports that since hospital discharge, she has been having 7 to 11 episodes a day, making him unable to work and leave her with their children. He brings a video of an episode while she was in the hospital, she is lying on the hospital bed with eyes closed then she has right hand irregular shaking followed by deep loud breathing. Her HR on the monitor goes up to 114. She states that when she is having the episodes, she can hear people around her but she cannot move or speak. She thinks to herself, "how do I stop this?" but she is unable to. Prior to an episode, either arm would start tingling, then she feels it go up her brain and either side of her head would start hurting, then she has more headaches afterwards. The headaches are different than her typical migraines. No tongue bite or incontinence. She denies any olfactory/gustatory hallucinations, deja vu, rising epigastric sensation, myoclonic jerks. She has  neck pain. Her brother has a history of seizures. She had a concussion in March 2021 where she slipped on ice and hit a metal pole. She lost her memory for a while, for 5 months she could not remember anything. Headaches were worse and Topiramate was started. She was also being "hateful," yelling for no reason, and was started on Prozac 20mg  daily which helped  with this. She denies any prior history of abuse. She reports having a good relationship with her husband of 16 years. She works as a and reports regular stress at work.    PAST MEDICAL HISTORY: Past Medical History:  Diagnosis Date   Blood transfusion without reported diagnosis    Complication of anesthesia    Dysautonomia (HCC)    sinus arrythmia   Family history of anesthesia complication    PONV   Headache(784.0)    Heart murmur    MVP (mitral valve prolapse)    PONV (postoperative nausea and vomiting)    Ventricular tachycardia, nonsustained (HCC)     PAST SURGICAL HISTORY: Past Surgical History:  Procedure Laterality Date   ABDOMINAL HYSTERECTOMY     CERVICAL CERCLAGE  2008   CERVICAL CERCLAGE  09/08/2011   Procedure: CERCLAGE CERVICAL;  Surgeon: 09/10/2011, MD;  Location: [facility];  Service: Gynecology;  Laterality: N/A;  [name],CST-scrubbed; doctor scrubbed in @ [phone]   LAPAROSCOPIC LYSIS OF ADHESIONS  08/09/2012   Procedure: LAPAROSCOPIC LYSIS OF ADHESIONS;  Surgeon: 08/11/2012, MD;  Location: [facility];  Service: Gynecology;  Laterality: N/A;   LAPAROSCOPY  08/09/2012   Procedure: LAPAROSCOPY DIAGNOSTIC;  Surgeon: 08/11/2012, MD;  Location: [facility];  Service: Gynecology;  Laterality: N/A;  started at  1208-Vaginal trachelectomy (vaginal removal of cervical stump)    MYRINGOTOMY     SUPRACERVICAL ABDOMINAL HYSTERECTOMY  01/26/2012   Procedure: HYSTERECTOMY SUPRACERVICAL ABDOMINAL;  Surgeon: 03/28/2012, MD;  Location: [facility];  Service: Gynecology;;  converted [phone]    MEDICATIONS: Current Outpatient Medications on File Prior to Visit  Medication Sig Dispense Refill   aspirin 81 MG chewable tablet Chew 1 tablet (81 mg total) by mouth daily.     cloNIDine (CATAPRES - DOSED IN MG/24 HR) 0.1 mg/24hr patch Place 1 patch (0.1 mg total) onto the skin once a week. 4 patch 12   flecainide (TAMBOCOR) 50 MG tablet Take 1 tablet (50 mg total) by mouth 2 (two)  times daily. 180 tablet 3   FLUoxetine (PROZAC) 20 MG capsule TAKE 1 CAPSULE BY MOUTH EVERY DAY (Patient taking differently: Take 20 mg by mouth daily.) 90 capsule 1   Magnesium Oxide 400 MG CAPS Take 1 capsule (400 mg total) by mouth daily. 90 capsule 0   Multiple Vitamin (MULTIVITAMIN WITH MINERALS) TABS tablet Take 1 tablet by mouth daily.     simvastatin (ZOCOR) 10 MG tablet Take 10 mg by mouth at bedtime.     SUMAtriptan (IMITREX) 50 MG tablet Take 2 tablets (100 mg total) by mouth every 2 (two) hours as needed for migraine. May repeat in 2 hours if headache persists or recurs.  Max dose 200 mg/day 9 tablet 12   topiramate (TOPAMAX) 100 MG tablet Take 1 tablet (100 mg total) by mouth 2 (two) times daily. 60 tablet 3   No current facility-administered medications on file prior to visit.    ALLERGIES: Allergies  Allergen Reactions   Calan [Verapamil] Other (See Comments)    Racing heart Dizzyness Headaches Body tremors  Coreg [Carvedilol] Hives and Itching   Definity [Perflutren Lipid Microsphere] Other (See Comments)    Headache   Morphine And Related Nausea And Vomiting   Norco [Hydrocodone-Acetaminophen] Itching and Nausea And Vomiting   Beta Adrenergic Blockers Hives, Itching, Nausea And Vomiting and Rash    Pt has reactions to: Atenolol Carvedilol Metoprolol Nebivolol    Bystolic [Nebivolol Hcl] Rash        Cardizem Cd [Diltiazem Hcl Er Beads] Rash   Tenormin [Atenolol] Itching and Rash   Toprol Xl [Metoprolol] Rash    Headaches Dizziness     FAMILY HISTORY: Family History  Problem Relation Age of Onset   Hypertension Brother    Heart disease Brother        hole in heart   Ovarian cancer Mother    Anesthesia problems Neg Hx    Hypotension Neg Hx    Pseudochol deficiency Neg Hx    Malignant hyperthermia Neg Hx    Other Neg Hx     SOCIAL HISTORY: Social History   Socioeconomic History   Marital status: Married    Spouse name: Not on file   Number of  children: 1   Years of education: Not on file   Highest education level: Not on file  Occupational History   Occupation: unemployed  Tobacco Use   Smoking status: Former    Years: 1.00    Types: Cigarettes    Start date: 10/01/2001    Quit date: 09/01/2009    Years since quitting: 12.3    Passive exposure: Never   Smokeless tobacco: Never   Tobacco comments:    smokes 1 pack a week  Vaping Use   Vaping Use: Never used  Substance and Sexual Activity   Alcohol use: No    Alcohol/week: 0.0 standard drinks of alcohol   Drug use: No   Sexual activity: Yes    Partners: Male    Birth control/protection: Surgical  Other Topics Concern   Not on file  Social History Narrative   Right handed can use left    Social Determinants of Health   Financial Resource Strain: Not on file  Food Insecurity: Not on file  Transportation Needs: Not on file  Physical Activity: Not on file  Stress: Not on file  Social Connections: Not on file  Intimate Partner Violence: Not on file     PHYSICAL EXAM: Vitals:   01/12/22 1247  BP: 95/63  Pulse: 90  SpO2: 99%   General: No acute distress Head:  Normocephalic/atraumatic Skin/Extremities: No rash, no edema Neurological Exam: Mental status: alert and awake, no dysarthria or aphasia, Fund of knowledge is appropriate.  Attention and concentration are normal.     Cranial nerves: CN I: not tested CN II: pupils equal, round and reactive to light, visual fields intact CN III, IV, VI:  full range of motion, no nystagmus, no ptosis CN V: reports decreased cold on right V2, decreased pin on left V2 CN VII: upper and lower face symmetric CN VIII: hearing intact to conversation Bulk & Tone: normal, no fasciculations. Motor: 5/5 throughout with no pronator drift. Sensation: decreased cold and pin on left UE, otherwise intact to light touch, cold, vibration sense. Romberg test negative Deep Tendon Reflexes: brisk +2 throughout, no ankle clonus,  negative Hoffman sign Cerebellar: no incoordination on finger to nose testing Gait: narrow-based and steady, able to tandem walk adequately. Tremor: none   IMPRESSION: This is a 36 year old ambidextrous woman with history of migraines,  NSVT, dysautonomia, in her usual state of health until 12/30/21 when she had an episode of loss of consciousness. Since then she has had recurrent episodes of unresponsiveness with body shaking, she had an episode in the office today that was consistent with a psychogenic non-epileptic event. She had 4 episodes captured during video EEG monitoring earlier this month that were also non-epileptic. Her baseline EEG was normal. She unfortunately continues to have up to 11 episodes daily, affecting her ability to function effectively on a daily basis. We had an extensive discussion about the diagnosis of Psychogenic Non-epileptic events (PNES). They are concerned because of her heart condition with the tachycardia, if she is not having these episodes, her heart rate would not go so high. We discussed the nature of treatment of PNES, cognitive behavioral therapy is recommended. Resources provided. We discussed increasing Prozac to 40mg  daily. She was given a prescription for lorazepam 0.5mg  as needed for anxiety to take if she has clusters of episodes. [place] driving laws were discussed, she knows to stop driving after an episode of loss of awareness, until 6 months event-free. Follow-up in 3 months, call for any changes.    Thank you for allowing me to participate in the care of this patient. Please do not hesitate to call for any questions or concerns.   Ellouise Newer, M.D.  CC: Dr. Carles Collet, Dr. Caryl Comes

## 2022-01-13 ENCOUNTER — Encounter: Payer: Self-pay | Admitting: Internal Medicine

## 2022-01-13 ENCOUNTER — Ambulatory Visit: Payer: BC Managed Care – PPO | Admitting: Internal Medicine

## 2022-01-13 VITALS — Ht 63.0 in | Wt 125.8 lb

## 2022-01-13 DIAGNOSIS — G909 Disorder of the autonomic nervous system, unspecified: Secondary | ICD-10-CM | POA: Diagnosis not present

## 2022-01-13 DIAGNOSIS — R002 Palpitations: Secondary | ICD-10-CM

## 2022-01-13 NOTE — Progress Notes (Signed)
Patient Care Team: [facility], [facility] as PCP - General Duke Salvia, MD as PCP - Electrophysiology (Cardiology) Wyline Mood Dorothe Pea, MD as Consulting Physician (Cardiology) Marinus Maw, MD as Consulting Physician (Cardiology) Van Clines, MD as Consulting Physician (Neurology)   HPI  Tara Mahoney is a 36 y.o. 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  Multiple events occurred with negative EEGs.  She was supine for most.  Her eyes were noted to be half open at the initial event.  No other work comments were made about that.  She is seen neurology as an outpatient.  Their note is not yet complete.      DATE TEST EF    8/17 cMRI 48 % ?  Noncompaction  9/17 Echo  50-55 %    3/18 Echo  55 %     3/23 Echo   75% MR-moderate-eccentric    Date Cr K Hgb  1/23 0.82 3.3 14.1                 Records and Results Reviewed   Losing weight since the beginning of lost about 10 pounds which NOAC  Past Medical History:  Diagnosis Date   Blood transfusion without reported diagnosis    Complication of anesthesia    Dysautonomia (HCC)    sinus arrythmia   Family history of anesthesia complication    PONV   Headache(784.0)    Heart murmur    MVP (mitral valve prolapse)     PONV (postoperative nausea and vomiting)    Ventricular tachycardia, nonsustained [facility])     Past Surgical History:  Procedure Laterality Date   ABDOMINAL HYSTERECTOMY     CERVICAL CERCLAGE  2008   CERVICAL CERCLAGE  09/08/2011   Procedure: CERCLAGE CERVICAL;  Surgeon: Lazaro Arms, MD;  Location: [facility];  Service: Gynecology;  Laterality: N/A;  [name],CST-scrubbed; doctor scrubbed in @ [phone]   LAPAROSCOPIC LYSIS OF ADHESIONS  08/09/2012   Procedure: LAPAROSCOPIC LYSIS OF ADHESIONS;  Surgeon: Lazaro Arms, MD;  Location: [facility];  Service: Gynecology;  Laterality: N/A;   LAPAROSCOPY  08/09/2012   Procedure: LAPAROSCOPY DIAGNOSTIC;  Surgeon: Lazaro Arms, MD;  Location: [facility];  Service: Gynecology;  Laterality: N/A;  started at  1208-Vaginal trachelectomy (vaginal removal of cervical stump)    MYRINGOTOMY     SUPRACERVICAL ABDOMINAL HYSTERECTOMY  01/26/2012   Procedure: HYSTERECTOMY SUPRACERVICAL ABDOMINAL;  Surgeon: Lazaro Arms, MD;  Location: [facility];  Service: Gynecology;;  converted [phone]    Current Meds  Medication Sig   aspirin 81 MG chewable tablet Chew 1 tablet (81 mg total) by mouth daily.   cloNIDine (CATAPRES - DOSED IN MG/24 HR) 0.1 mg/24hr patch Place 1 patch (0.1 mg total) onto the skin once a week.   flecainide (TAMBOCOR) 50 MG  tablet Take 1 tablet (50 mg total) by mouth 2 (two) times daily.   FLUoxetine (PROZAC) 40 MG capsule Take 1 capsule (40 mg total) by mouth daily.   LORazepam (ATIVAN) 0.5 MG tablet Take 1 tablet once a day as needed for anxiety   Magnesium Oxide 400 MG CAPS Take 1 capsule (400 mg total) by mouth daily.   Multiple Vitamin (MULTIVITAMIN WITH MINERALS) TABS tablet Take 1 tablet by mouth daily.   simvastatin (ZOCOR) 10 MG tablet Take 10 mg by mouth at bedtime.   SUMAtriptan (IMITREX) 50 MG tablet Take 2 tablets (100 mg total) by mouth every 2 (two) hours as needed for migraine. May repeat in 2 hours if headache persists or recurs.  Max dose 200  mg/day   topiramate (TOPAMAX) 100 MG tablet Take 1 tablet (100 mg total) by mouth 2 (two) times daily.    Allergies  Allergen Reactions   Calan [Verapamil] Other (See Comments)    Racing heart Dizzyness Headaches Body tremors   Coreg [Carvedilol] Hives and Itching   Definity [Perflutren Lipid Microsphere] Other (See Comments)    Headache   Morphine And Related Nausea And Vomiting   Norco [Hydrocodone-Acetaminophen] Itching and Nausea And Vomiting   Beta Adrenergic Blockers Hives, Itching, Nausea And Vomiting and Rash    Pt has reactions to: Atenolol Carvedilol Metoprolol Nebivolol    Bystolic [Nebivolol Hcl] Rash        Cardizem Cd [Diltiazem Hcl Er Beads] Rash   Tenormin [Atenolol] Itching and Rash   Toprol Xl [Metoprolol] Rash    Headaches Dizziness       Review of Systems negative except from HPI and PMH  Physical Exam Ht 5\' 3"  (1.6 m)   Wt 125 lb 12.8 oz (57.1 kg)   LMP 05/04/2011   SpO2 95%   BMI 22.28 kg/m  Well developed and nourished in no acute distress HENT normal Neck supple with JVP-  flat   Clear Regular rate and rhythm, no murmurs or gallops Abd-soft with active BS No Clubbing cyanosis edema Skin-warm and dry A & Oriented  Grossly normal sensory and motor function  ECG sinus at 68 18/09/43    Estimated Creatinine Clearance: 67.1 mL/min (by C-G formula based on SCr of 0.94 mg/dL).   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  Her orthostatic vital signs are more abnormal today with the increase in heart rate and drop in blood  pressure.  Her sodium intake has been in the past has been some par, have encouraged her to target 2-3 g of sodium a day.  We also discussed abdominal binding and thigh binding as it gravitational treatments.  She is to follow-up with Dr. Martin Majestic in neurology; have suggested also that an epileptologist might be of value if the symptoms persist despite the discontinuation of the clonidine.  In the event that they do we will resume the clonidine as it has had a significant impact on heart rate.  We also will need to further clarify the mitral valve but will hold off on that for  right now.

## 2022-01-13 NOTE — Patient Instructions (Signed)
Medication Instructions:  Your physician has recommended you make the following change in your medication:   ** Stop Clonidine   *If you need a refill on your cardiac medications before your next appointment, please call your pharmacy*   Lab Work: None ordered.  If you have labs (blood work) drawn today and your tests are completely normal, you will receive your results only by: MyChart Message (if you have MyChart) OR A paper copy in the mail If you have any lab test that is abnormal or we need to change your treatment, we will call you to review the results.   Testing/Procedures: None ordered.    Follow-Up: At [facility], you and your health needs are our priority.  As part of our continuing mission to provide you with exceptional heart care, we have created designated Provider Care Teams.  These Care Teams include your primary Cardiologist (physician) and Advanced Practice Providers (APPs -  Physician Assistants and Nurse Practitioners) who all work together to provide you with the care you need, when you need it.  We recommend signing up for the patient portal called "MyChart".  Sign up information is provided on this After Visit Summary.  MyChart is used to connect with patients for Virtual Visits (Telemedicine).  Patients are able to view lab/test results, encounter notes, upcoming appointments, etc.  Non-urgent messages can be sent to your provider as well.   To learn more about what you can do with MyChart, go to [email or web address].    Your next appointment:   3 week tele-health}    Other Instructions Discussed salt substitutes with a  goal of 2-3 gms of Sodium or 5-7 gm of sodium Chloride daily.  Rehydration solutions include Liquid IV, NUUN, TriOral, Normralyte pedialyte advanced care and Banana Bags.  Salt tablets include plain salt tablets, SaltStick Vitassium, thermatabs amongst others.   The higher sodium concentration per serving on this list is normalyte  about 800 mg of sodium per serving LMNT1000 mg and most recently of worried about Within You hydration formula also at 1000 mg  Salt supplements are best used with adjunctive sugar  Also discussed the role of compression wear including thigh sleeves and abdominal binders.  Calf compression is not specifically recommended..   Important Information About Sugar

## 2022-01-22 ENCOUNTER — Inpatient Hospital Stay (HOSPITAL_COMMUNITY): Payer: BC Managed Care – PPO | Attending: Hematology | Admitting: Hematology

## 2022-01-22 ENCOUNTER — Ambulatory Visit: Payer: BC Managed Care – PPO | Admitting: Neurology

## 2022-01-22 ENCOUNTER — Inpatient Hospital Stay (HOSPITAL_COMMUNITY): Payer: BC Managed Care – PPO

## 2022-01-22 ENCOUNTER — Encounter (HOSPITAL_COMMUNITY): Payer: Self-pay | Admitting: Hematology

## 2022-01-22 VITALS — BP 109/75 | HR 98 | Temp 98.1°F | Resp 18 | Ht 64.25 in | Wt 124.8 lb

## 2022-01-22 DIAGNOSIS — Z885 Allergy status to narcotic agent status: Secondary | ICD-10-CM | POA: Diagnosis not present

## 2022-01-22 DIAGNOSIS — Z79899 Other long term (current) drug therapy: Secondary | ICD-10-CM | POA: Diagnosis not present

## 2022-01-22 DIAGNOSIS — R760 Raised antibody titer: Secondary | ICD-10-CM | POA: Diagnosis not present

## 2022-01-22 DIAGNOSIS — R519 Headache, unspecified: Secondary | ICD-10-CM | POA: Insufficient documentation

## 2022-01-22 DIAGNOSIS — D696 Thrombocytopenia, unspecified: Secondary | ICD-10-CM | POA: Insufficient documentation

## 2022-01-22 DIAGNOSIS — R002 Palpitations: Secondary | ICD-10-CM | POA: Diagnosis not present

## 2022-01-22 DIAGNOSIS — R0602 Shortness of breath: Secondary | ICD-10-CM | POA: Insufficient documentation

## 2022-01-22 DIAGNOSIS — Z8249 Family history of ischemic heart disease and other diseases of the circulatory system: Secondary | ICD-10-CM | POA: Diagnosis not present

## 2022-01-22 DIAGNOSIS — R569 Unspecified convulsions: Secondary | ICD-10-CM | POA: Diagnosis not present

## 2022-01-22 DIAGNOSIS — R590 Localized enlarged lymph nodes: Secondary | ICD-10-CM | POA: Diagnosis not present

## 2022-01-22 DIAGNOSIS — Z8041 Family history of malignant neoplasm of ovary: Secondary | ICD-10-CM | POA: Insufficient documentation

## 2022-01-22 DIAGNOSIS — G479 Sleep disorder, unspecified: Secondary | ICD-10-CM | POA: Diagnosis not present

## 2022-01-22 DIAGNOSIS — Z87891 Personal history of nicotine dependence: Secondary | ICD-10-CM | POA: Insufficient documentation

## 2022-01-22 DIAGNOSIS — Z90711 Acquired absence of uterus with remaining cervical stump: Secondary | ICD-10-CM | POA: Insufficient documentation

## 2022-01-22 DIAGNOSIS — R200 Anesthesia of skin: Secondary | ICD-10-CM | POA: Diagnosis not present

## 2022-01-22 DIAGNOSIS — R079 Chest pain, unspecified: Secondary | ICD-10-CM | POA: Diagnosis not present

## 2022-01-22 DIAGNOSIS — R5383 Other fatigue: Secondary | ICD-10-CM | POA: Diagnosis not present

## 2022-01-22 DIAGNOSIS — R61 Generalized hyperhidrosis: Secondary | ICD-10-CM | POA: Diagnosis not present

## 2022-01-22 DIAGNOSIS — R42 Dizziness and giddiness: Secondary | ICD-10-CM | POA: Insufficient documentation

## 2022-01-22 DIAGNOSIS — Z8673 Personal history of transient ischemic attack (TIA), and cerebral infarction without residual deficits: Secondary | ICD-10-CM | POA: Insufficient documentation

## 2022-01-22 DIAGNOSIS — I472 Ventricular tachycardia, unspecified: Secondary | ICD-10-CM | POA: Diagnosis not present

## 2022-01-22 LAB — CBC WITH DIFFERENTIAL/PLATELET
Abs Immature Granulocytes: 0.01 10*3/uL (ref 0.00–0.07)
Basophils Absolute: 0 10*3/uL (ref 0.0–0.1)
Basophils Relative: 1 %
Eosinophils Absolute: 0.4 10*3/uL (ref 0.0–0.5)
Eosinophils Relative: 7 %
HCT: 41.5 % (ref 36.0–46.0)
Hemoglobin: 14.2 g/dL (ref 12.0–15.0)
Immature Granulocytes: 0 %
Lymphocytes Relative: 30 %
Lymphs Abs: 1.6 10*3/uL (ref 0.7–4.0)
MCH: 31.5 pg (ref 26.0–34.0)
MCHC: 34.2 g/dL (ref 30.0–36.0)
MCV: 92 fL (ref 80.0–100.0)
Monocytes Absolute: 0.3 10*3/uL (ref 0.1–1.0)
Monocytes Relative: 5 %
Neutro Abs: 3.2 10*3/uL (ref 1.7–7.7)
Neutrophils Relative %: 57 %
Platelets: 179 10*3/uL (ref 150–400)
RBC: 4.51 MIL/uL (ref 3.87–5.11)
RDW: 12.4 % (ref 11.5–15.5)
WBC: 5.6 10*3/uL (ref 4.0–10.5)
nRBC: 0 % (ref 0.0–0.2)

## 2022-01-22 LAB — HEPATITIS C ANTIBODY: HCV Ab: NONREACTIVE

## 2022-01-22 LAB — HEPATITIS B SURFACE ANTIGEN: Hepatitis B Surface Ag: NONREACTIVE

## 2022-01-22 LAB — HEPATITIS B CORE ANTIBODY, TOTAL: Hep B Core Total Ab: NONREACTIVE

## 2022-01-22 LAB — HEPATITIS B SURFACE ANTIBODY,QUALITATIVE: Hep B S Ab: NONREACTIVE

## 2022-01-22 NOTE — Progress Notes (Signed)
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 significant stenosis or aneurysm. Right carotid system: The right common carotid artery is within normal limits. Bifurcation is unremarkable. Cervical right ICA is normal. Left carotid system: The left common carotid artery is within  normal limits. Bifurcation is unremarkable. Cervical left ICA is normal. Vertebral arteries: Vertebral arteries are codominant. Both vertebral arteries originate from the subclavian arteries without significant stenosis. Skeleton: No focal osseous lesions. Other neck: Soft tissues the neck are otherwise unremarkable. Salivary glands are within normal limits. Thyroid is normal. No significant adenopathy is present. No focal mucosal or submucosal lesions are present. Upper chest: Lung apices are clear. Thoracic inlet is within normal limits. Review of the MIP images confirms the above findings CTA HEAD FINDINGS  Anterior circulation: The internal carotid arteries are within normal limits from the skull base through the ICA termini. The A1 and M1 segments are normal. The anterior communicating artery is patent. MCA bifurcations are intact. The ACA and MCA branch vessels are normal. Posterior circulation: The vertebral arteries are codominant. PICA origins are visualized and normal. The left posterior cerebral artery originates from basilar tip. The right posterior cerebral artery receives scratched at the right posterior cerebral artery is of fetal type with contribution from a small right P1 segment. PCA branch vessels are within normal limits bilaterally. Venous sinuses: The dural sinuses are patent. The straight sinus deep cerebral veins are intact. Cortical veins are within normal limits. No significant vascular malformation is evident. Anatomic variants: None Review of the MIP images confirms the above findings IMPRESSION: 1. Normal variant CTA Circle of Willis without significant proximal stenosis, aneurysm, or branch vessel occlusion. 2. Normal CTA of the neck. 3. Normal noncontrast CT of the head. No acute or focal lesion to explain the patient's symptoms. Electronically Signed   By: Marin Roberts M.D.   On: 01/02/2022 18:45   CT ANGIO HEAD NECK W WO CM  Result Date: 01/02/2022 CLINICAL DATA:  36 year old female with recent seizure. 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 Unrevealing MRA. EXAM: CT ANGIOGRAPHY HEAD AND NECK TECHNIQUE: Multidetector CT imaging of the head and neck was performed using the standard protocol during bolus administration of intravenous contrast. Multiplanar CT image reconstructions and MIPs were obtained to evaluate the vascular anatomy. Carotid stenosis measurements (when applicable) are obtained utilizing NASCET criteria, using the distal internal carotid diameter as the denominator. RADIATION DOSE REDUCTION: This exam was performed according to the departmental  dose-optimization program which includes automated exposure control, adjustment of the mA and/or kV according to patient size and/or use of iterative reconstruction technique. CONTRAST:  63mL OMNIPAQUE IOHEXOL 350 MG/ML SOLN COMPARISON:  MRA head and neck 12/31/2021. Head CT 08/13/2020. FINDINGS: CT HEAD Brain: Stable since January and negative noncontrast CT appearance of the brain. Small chronic left posterior cerebellar infarct essentially occult by CT (series 5, image 26). Calvarium and skull base: Negative. Paranasal sinuses: Visualized paranasal sinuses and mastoids are stable and well aerated. Orbits: Leftward gaze, otherwise negative orbits and scalp. CTA NECK Skeleton: Reversal of the normal cervical lordosis, but otherwise negative. No acute osseous abnormality identified. Upper chest: Negative. Other neck: Negative. Aortic arch: Negative. Three vessel arch configuration. Right carotid system: Negative. Left carotid system: Proximal left CCA partially obscured by dense adjacent venous contrast streak artifact. Otherwise negative. Vertebral arteries: Negative; some proximal vertebral artery detail mildly obscured by dense adjacent venous contrast reflux. CTA HEAD Posterior circulation: Codominant distal vertebral arteries appear normal to the vertebrobasilar junction. Patent PICA origins. Patent basilar artery without stenosis. Fetal type right PCA origin. Patent basilar tip, SCA and left PCA origins. Bilateral PCA branches are within normal limits. Anterior circulation: Both ICA siphons are patent without stenosis. Normal right posterior communicating artery origin. Patent  carotid termini, MCA and ACA origins. Diminutive or absent anterior communicating artery. Bilateral ACA branches are within normal limits. Left MCA M1 segment and bifurcation are patent without stenosis. Right MCA M1 segment and bifurcation are patent without stenosis. Bilateral MCA branches are within normal limits. Venous sinuses:  Patent. Anatomic variants: Fetal type right PCA origin. Review of the MIP images confirms the above findings IMPRESSION: 1. Normal CTA Head and Neck. 2.  No acute intracranial abnormality. Electronically Signed   By: Odessa Fleming M.D.   On: 01/02/2022 12:19   EEG adult  Result Date: 12/31/2021 Charlsie Quest, MD     12/31/2021  6:21 PM Patient Name: Esthela Brandner MRN: [id number] Epilepsy Attending: Charlsie Quest Referring Physician/Provider: Catarina Hartshorn, MD Date: 12/31/2021 Duration: 23.08 mins Patient history: 36yo F with transient alteration of awareness. EEG to evaluate for seizure. Level of alertness: Awake AEDs during EEG study: TPM Technical aspects: This EEG study was done with scalp electrodes positioned according to the 10-20 International system of electrode placement. Electrical activity was acquired at a sampling rate of  and reviewed with a high frequency filter of  and a low frequency filter of . EEG data were recorded continuously and digitally stored. Description: The posterior dominant rhythm consists of 10 Hz activity of moderate voltage (25-35 uV) seen predominantly in posterior head regions, symmetric and reactive to eye opening and eye closing. Hyperventilation and photic stimulation were not performed.   IMPRESSION: This study is within normal limits. No seizures or epileptiform discharges were seen throughout the recording. Charlsie Quest   MR ANGIO NECK W WO CONTRAST  Result Date: 12/31/2021 CLINICAL DATA:  36 year old female with seizure, Brain MRI yesterday reveals small chronic infarct in the posterior left cerebellum. EXAM: MRA NECK WITHOUT AND WITH CONTRAST TECHNIQUE: Multiplanar and multiecho pulse sequences of the neck were obtained without and with intravenous contrast. Angiographic images of the neck were obtained using MRA technique without and with intravenous contrast. CONTRAST:  6mL GADAVIST GADOBUTROL 1 MMOL/ML IV SOLN COMPARISON:  Brain MRI yesterday.   Intracranial MRA today. FINDINGS: Precontrast time-of-flight neck MRA reveals antegrade flow in both cervical carotid and vertebral arteries. Carotid bifurcations appear normal. Post-contrast neck MRA reveals a 3 vessel arch configuration. Proximal great vessels appear normal. Bilateral Common carotid arteries and carotid bifurcations appear normal. Bilateral cervical ICAs appear normal. And visible bilateral ICA siphons and termini are patent. Normal proximal subclavian arteries. Codominant vertebral arteries. Both vertebral artery origins appear normal. No evidence of vertebral artery stenosis in the neck or the posterior fossa. Patent vertebrobasilar junction. Visible basilar artery appears normal. IMPRESSION: Negative Neck MRA. Electronically Signed   By: Odessa Fleming M.D.   On: 12/31/2021 11:16   MR ANGIO HEAD WO CONTRAST  Result Date: 12/31/2021 CLINICAL DATA:  36 year old female with seizure, Brain MRI yesterday reveals small chronic infarct in the posterior left cerebellum. EXAM: MRA HEAD WITHOUT CONTRAST TECHNIQUE: Angiographic images of the Circle of Willis were acquired using MRA technique without intravenous contrast. COMPARISON:  Brain MRI 12/30/2021. FINDINGS: Anterior circulation: Antegrade flow in both ICA siphons. No siphon stenosis. Normal ophthalmic and right posterior communicating artery origins. Patent carotid termini, MCA and ACA origins. Normal anterior communicating artery. Bilateral ACA branches are within normal limits. MCA M1 segments and MCA bifurcations are patent without stenosis. Visible bilateral MCA branches appear normal. Posterior circulation: Antegrade flow in the posterior circulation with codominant distal vertebral arteries. No distal vertebral or vertebrobasilar junction stenosis. Both PICA are patent.  Patent basilar artery without stenosis. Patent AICA, SCA, and PCA origins. Fetal type right PCA origin. Left posterior communicating artery diminutive or absent. Bilateral PCA  branches are within normal limits. Anatomic variants: Fetal type right PCA origin. Other: No intracranial mass effect or ventriculomegaly. IMPRESSION: Negative intracranial MRA. Electronically Signed   By: Odessa Fleming M.D.   On: 12/31/2021 11:14   DG Chest 2 View  Result Date: 12/30/2021 CLINICAL DATA:  Ventricular tachycardia EXAM: CHEST - 2 VIEW COMPARISON:  None Available. FINDINGS: The heart size and mediastinal contours are within normal limits. Both lungs are clear. The visualized skeletal structures are unremarkable. IMPRESSION: No active cardiopulmonary disease. Electronically Signed   By: Helyn Numbers M.D.   On: 12/30/2021 23:44   MR BRAIN WO CONTRAST  Result Date: 12/30/2021 CLINICAL DATA:  Provided history: Seizure, new onset, no history of trauma. Head CT 08/13/2020. EXAM: MRI HEAD WITHOUT CONTRAST TECHNIQUE: Multiplanar, multiecho pulse sequences of the brain and surrounding structures were obtained without intravenous contrast. COMPARISON:  None Available. FINDINGS: Brain: Cerebral volume is normal. 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 No cortical encephalomalacia is identified. No significant cerebral white matter disease. No appreciable hippocampal size or signal asymmetry. There is no acute infarct. No evidence of an intracranial mass. No chronic intracranial blood products. No extra-axial fluid collection. No midline shift. Vascular: Maintained flow voids within the proximal large arterial vessels. Skull and upper cervical spine: No focal suspicious marrow lesion. Sinuses/Orbits: No mass or acute finding within the imaged orbits. Mild mucosal thickening within the bilateral ethmoid and maxillary sinuses. Other: Trace fluid within the left mastoid air cells. IMPRESSION: 1. No evidence of acute intracranial abnormality. 2. No specific seizure focus is identified. 3. Subcentimeter chronic infarct within the  inferior left cerebellar hemisphere. 4. Otherwise unremarkable non-contrast MRI appearance of the brain. 5. Mild mucosal thickening within the bilateral ethmoid and maxillary sinuses. 6. Trace fluid within the left mastoid air cells. Electronically Signed   By: Jackey Loge D.O.   On: 12/30/2021 17:04    ASSESSMENT:  Mild thrombocytopenia: - She has a history of mild decrease in platelet count since 2008, lowest at 82.  That was during hospitalization. - She is reportedly being worked up for new onset seizures since April 2023. - Denies any bleeding or easy bruising.  She is on Topamax and Imitrex for migraines.   Social/family history: - She is married and seen with her husband today.  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  No chemicals or pesticides.  Not tobacco use. - Mother had uterine/ovarian cancer.  Paternal aunt had breast cancer.   PLAN:  Thrombocytopenia: - We discussed differential diagnosis of mild thrombocytopenia. - We will check for nutritional deficiencies, connective tissue disorders, and infectious etiologies.  We will also check for lupus anticoagulant, anticardiolipin antibodies and antibeta 2 glycoprotein 1 antibodies. - RTC 2 to 3 weeks for follow-up.   All questions were answered. The patient knows to call the clinic with any problems, questions or concerns.  Doreatha Massed, MD 01/22/22 3:51 PM  [facility] [phone]   I, Alda Ponder, am acting as a scribe for Dr. Doreatha Massed.  I, Doreatha Massed MD, have reviewed the above documentation for accuracy and completeness, and I agree with the above.

## 2022-01-22 NOTE — Patient Instructions (Addendum)
[facility] at [facility] Discharge Instructions  You were seen and examined today by Dr. Ellin Saba. Dr. Ellin Saba is a hematologist, meaning that he specializes in blood abnormalities. Dr. Ellin Saba discussed your past medical history, family history of cancers/blood conditions and the events that led to you being here today.  You were referred to Dr. Ellin Saba for low platelets. Platelets are a blood product that is responsible for clotting. Your platelets have been low off and on since 2008. It does not appear that it has ever been dangerously low. They have been as low as 82 - this was most likely related to pregnancy, and this is typical in pregnancy. The normal range for platelets is 150 to 400. At the last check, your platelets were within normal limits at 164.  You may notice bruising related to the decrease platelets, but at this point it is not spontaneous bruising. That is good!  Dr. Ellin Saba has recommended additional lab work in an attempt to identify the cause of your low platelets. Low platelets can be related to medications.  Follow-up as scheduled.   Thank you for choosing [facility] at [facility] to provide your oncology and hematology care.  To afford each patient quality time with our provider, please arrive at least 15 minutes before your scheduled appointment time.   If you have a lab appointment with the Cancer Center please come in thru the Main Entrance and check in at the main information desk.  You need to re-schedule your appointment should you arrive 10 or more minutes late.  We strive to give you quality time with our providers, and arriving late affects you and other patients whose appointments are after yours.  Also, if you no show three or more times for appointments you may be dismissed from the clinic at the providers discretion.     Again, thank you for choosing [facility].  Our hope is that  these requests will decrease the amount of time that you wait before being seen by our physicians.       _____________________________________________________________  Should you have questions after your visit to [facility], please contact our office at [phone] and follow the prompts.  Our office hours are 8:00 a.m. and 4:30 p.m. Monday - Friday.  Please note that voicemails left after 4:00 p.m. may not be returned until the following business day.  We are closed weekends and major holidays.  You do have access to a nurse 24-7, just call the main number to the clinic [phone] and do not press any options, hold on the line and a nurse will answer the phone.    For prescription refill requests, have your pharmacy contact our office and allow 72 hours.

## 2022-01-23 LAB — BETA-2-GLYCOPROTEIN I ABS, IGG/M/A
Beta-2 Glyco I IgG: <9 GPI IgG units (ref 0–20)
Beta-2-Glycoprotein I IgA: <9 GPI IgA units (ref 0–25)
Beta-2-Glycoprotein I IgM: <9 GPI IgM units (ref 0–32)

## 2022-01-23 LAB — RHEUMATOID FACTOR: Rheumatoid fact SerPl-aCnc: 10.1 IU/mL (ref <14.0)

## 2022-01-24 LAB — LUPUS ANTICOAGULANT PANEL
DRVVT: 39.3 s (ref 0.0–47.0)
PTT Lupus Anticoagulant: 31.9 s (ref 0.0–43.5)

## 2022-01-24 LAB — CARDIOLIPIN ANTIBODIES, IGG, IGM, IGA
Anticardiolipin IgA: <9 APL U/mL (ref 0–11)
Anticardiolipin IgG: <9 GPL U/mL (ref 0–14)
Anticardiolipin IgM: 15 MPL U/mL — ABNORMAL HIGH (ref 0–12)

## 2022-01-24 LAB — METHYLMALONIC ACID, SERUM: Methylmalonic Acid, Quantitative: 220 nmol/L (ref 0–378)

## 2022-01-25 ENCOUNTER — Encounter (HOSPITAL_COMMUNITY): Payer: BC Managed Care – PPO | Admitting: Hematology

## 2022-01-25 LAB — ANTINUCLEAR ANTIBODIES, IFA: ANA Ab, IFA: NEGATIVE

## 2022-01-26 ENCOUNTER — Telehealth: Payer: Self-pay | Admitting: *Deleted

## 2022-01-26 LAB — PROTEIN ELECTROPHORESIS, SERUM
A/G Ratio: 1.5 (ref 0.7–1.7)
Albumin ELP: 4.1 g/dL (ref 2.9–4.4)
Alpha-1-Globulin: 0.2 g/dL (ref 0.0–0.4)
Alpha-2-Globulin: 0.6 g/dL (ref 0.4–1.0)
Beta Globulin: 0.9 g/dL (ref 0.7–1.3)
Gamma Globulin: 1.1 g/dL (ref 0.4–1.8)
Globulin, Total: 2.8 g/dL (ref 2.2–3.9)
Total Protein ELP: 6.9 g/dL (ref 6.0–8.5)

## 2022-01-26 NOTE — Telephone Encounter (Signed)
  Patient Consent for Virtual Visit        Tara Mahoney has provided verbal consent on 01/26/2022 for a virtual visit (video or telephone).   CONSENT FOR VIRTUAL VISIT FOR:  Tara Mahoney  By participating in this virtual visit I agree to the following:  I hereby voluntarily request, consent and authorize [facility] and its employed or contracted physicians, physician assistants, nurse practitioners or other licensed health care professionals (the Practitioner), to provide me with telemedicine health care services (the "Services") as deemed necessary by the treating Practitioner. I acknowledge and consent to receive the Services by the Practitioner via telemedicine. I understand that the telemedicine visit will involve communicating with the Practitioner through live audiovisual communication technology and the disclosure of certain medical information by electronic transmission. I acknowledge that I have been given the opportunity to request an in-person assessment or other available alternative prior to the telemedicine visit and am voluntarily participating in the telemedicine visit.  I understand that I have the right to withhold or withdraw my consent to the use of telemedicine in the course of my care at any time, without affecting my right to future care or treatment, and that the Practitioner or I may terminate the telemedicine visit at any time. I understand that I have the right to inspect all information obtained and/or recorded in the course of the telemedicine visit and may receive copies of available information for a reasonable fee.  I understand that some of the potential risks of receiving the Services via telemedicine include:  Delay or interruption in medical evaluation due to technological equipment failure or disruption; Information transmitted may not be sufficient (e.g. poor resolution of images) to allow for appropriate medical decision making by the Practitioner; and/or   In rare instances, security protocols could fail, causing a breach of personal health information.  Furthermore, I acknowledge that it is my responsibility to provide information about my medical history, conditions and care that is complete and accurate to the best of my ability. I acknowledge that Practitioner's advice, recommendations, and/or decision may be based on factors not within their control, such as incomplete or inaccurate data provided by me or distortions of diagnostic images or specimens that may result from electronic transmissions. I understand that the practice of medicine is not an exact science and that Practitioner makes no warranties or guarantees regarding treatment outcomes. I acknowledge that a copy of this consent can be made available to me via my patient portal [facility] MyChart), or I can request a printed copy by calling the office of [facility].    I understand that my insurance will be billed for this visit.   I have read or had this consent read to me. I understand the contents of this consent, which adequately explains the benefits and risks of the Services being provided via telemedicine.  I have been provided ample opportunity to ask questions regarding this consent and the Services and have had my questions answered to my satisfaction. I give my informed consent for the services to be provided through the use of telemedicine in my medical care

## 2022-01-27 LAB — COPPER, SERUM: Copper: 100 ug/dL (ref 80–158)

## 2022-01-29 ENCOUNTER — Ambulatory Visit: Payer: BC Managed Care – PPO | Admitting: Internal Medicine

## 2022-02-02 ENCOUNTER — Encounter: Payer: Self-pay | Admitting: Internal Medicine

## 2022-02-02 ENCOUNTER — Ambulatory Visit (INDEPENDENT_AMBULATORY_CARE_PROVIDER_SITE_OTHER): Payer: BC Managed Care – PPO | Admitting: Internal Medicine

## 2022-02-02 VITALS — BP 109/53 | HR 94 | Ht 64.25 in | Wt 120.0 lb

## 2022-02-02 DIAGNOSIS — G909 Disorder of the autonomic nervous system, unspecified: Secondary | ICD-10-CM | POA: Diagnosis not present

## 2022-02-02 DIAGNOSIS — R002 Palpitations: Secondary | ICD-10-CM | POA: Diagnosis not present

## 2022-02-02 MED ORDER — CLONIDINE 0.1 MG/24HR TD PTWK: 0.1000 mg | MEDICATED_PATCH | TRANSDERMAL | 12 refills | Status: DC

## 2022-02-02 NOTE — Patient Instructions (Addendum)
Medication Instructions:  - Your physician has recommended you make the following change in your medication:   1) RESTART Clonidine 0.1 mg/ 24 hour patch: - place 1 patch onto the skin once a week  *If you need a refill on your cardiac medications before your next appointment, please call your pharmacy*   Lab Work: - none ordered  If you have labs (blood work) drawn today and your tests are completely normal, you will receive your results only by: MyChart Message (if you have MyChart) OR A paper copy in the mail If you have any lab test that is abnormal or we need to change your treatment, we will call you to review the results.   Testing/Procedures: - none ordered   Follow-Up: At [facility], you and your health needs are our priority.  As part of our continuing mission to provide you with exceptional heart care, we have created designated Provider Care Teams.  These Care Teams include your primary Cardiologist (physician) and Advanced Practice Providers (APPs -  Physician Assistants and Nurse Practitioners) who all work together to provide you with the care you need, when you need it.  We recommend signing up for the patient portal called "MyChart".  Sign up information is provided on this After Visit Summary.  MyChart is used to connect with patients for Virtual Visits (Telemedicine).  Patients are able to view lab/test results, encounter notes, upcoming appointments, etc.  Non-urgent messages can be sent to your provider as well.   To learn more about what you can do with MyChart, go to [email or web address].    Your next appointment:   3-4  month(s)  The format for your next appointment:   Virtual Visit   Provider:   Sherryl Manges, MD    Other Instructions N/a  Important Information About Sugar

## 2022-02-02 NOTE — Progress Notes (Signed)
Electrophysiology TeleHealth Note   Due to national recommendations of social distancing due to COVID 19, an audio/video telehealth visit is felt to be most appropriate for this patient at this time.  See MyChart message from today for the patient's consent to telehealth for [facility].   Date:  02/02/2022   ID:  Tara Mahoney, DOB 29-Feb-1986, MRN [id number]  Location: patient's home  Provider location: [address], [place]  Evaluation Performed: Follow-up visit  PCP:  [facility]  Cardiologist:    Electrophysiologist:  SK   Chief Complaint:    History of Present Illness:    Tara Mahoney is a 36 y.o. female who presents via audio/video conferencing for a telehealth visit today.  Since last being seen in our clinic for PVCs and PACs nonsustained ventricular tachycardia in the context of a mild cardiomyopathy with a cMRI concerned about noncompaction but was never confirmed.  Flecainide had been used with only modest benefit and more recently clonidine with great improvement.   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  Multiple events occurred with negative EEGs.  She was supine for most.  Her eyes were noted to be half open at the initial event.  No other work comments were made about that. 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    No better off clonidine          DATE TEST EF    8/17 cMRI 48 % ?  Noncompaction  9/17 Echo  50-55 %    3/18 Echo   55 %     3/23 Echo   75% MR-moderate-eccentric    Date Cr K Hgb  1/23 0.82 3.3 14.1   6/23 0.94 3.5 14.2        Records and Results Reviewed   Losing weight since the beginning of lost about 10 pounds which NOAC  Past Medical History:  Diagnosis Date   Blood transfusion without reported diagnosis    Complication of anesthesia    Dysautonomia (HCC)    sinus arrythmia   Family history of anesthesia complication    PONV   Headache(784.0)    Heart murmur    MVP (mitral valve prolapse)    PONV (postoperative nausea and vomiting)    Ventricular tachycardia, nonsustained [facility])     Past Surgical History:  Procedure Laterality Date   ABDOMINAL HYSTERECTOMY     CERVICAL CERCLAGE  2008   CERVICAL CERCLAGE  09/08/2011   Procedure: CERCLAGE CERVICAL;  Surgeon: Lazaro Arms, MD;  Location: [facility];  Service: Gynecology;  Laterality: N/A;  [name],CST-scrubbed; doctor scrubbed in @ [phone]   LAPAROSCOPIC LYSIS OF ADHESIONS  08/09/2012   Procedure: LAPAROSCOPIC LYSIS OF ADHESIONS;  Surgeon: Lazaro Arms, MD;  Location: [facility];  Service: Gynecology;  Laterality: N/A;   LAPAROSCOPY  08/09/2012   Procedure: LAPAROSCOPY DIAGNOSTIC;  Surgeon: Lazaro Arms, MD;  Location: [facility];  Service: Gynecology;  Laterality: N/A;  started at  1208-Vaginal trachelectomy (vaginal removal of cervical  stump)    MYRINGOTOMY     SUPRACERVICAL ABDOMINAL HYSTERECTOMY  01/26/2012   Procedure: HYSTERECTOMY SUPRACERVICAL ABDOMINAL;  Surgeon: Lazaro Arms, MD;  Location: [facility];  Service: Gynecology;;  converted [phone]    No outpatient medications have been marked as taking for the 02/02/22 encounter (Appointment) with Duke Salvia, MD.    Allergies  Allergen Reactions   Calan [Verapamil] Other (See Comments)    Racing heart Dizzyness Headaches Body tremors   Coreg [Carvedilol] Hives and Itching   Definity [Perflutren Lipid Microsphere] Other (See Comments)    Headache   Morphine And Related Nausea  And Vomiting   Norco [Hydrocodone-Acetaminophen] Itching and Nausea And Vomiting   Beta Adrenergic Blockers Hives, Itching, Nausea And Vomiting and Rash    Pt has reactions to: Atenolol Carvedilol Metoprolol Nebivolol    Bystolic [Nebivolol Hcl] Rash        Cardizem Cd [Diltiazem Hcl Er Beads] Rash   Tenormin [Atenolol] Itching and Rash   Toprol Xl [Metoprolol] Rash    Headaches Dizziness       Review of Systems negative except from HPI and PMH  Physical Exam LMP 05/04/2011       CrCl cannot be calculated (Patient's most recent lab result is older than the maximum 21 days allowed.).   Assessment and  Plan Palpitations  Autonomic dysfunction    MR eccentric  mod   Chest pain syndrome    Beta-blocker allergies with hives  Cardiomyopathy-mild-resolved   Continues to have spells although most of them apparently are being observed by her daughter or her husband at night.  During our phone call today denies any stress.  Her husband was in the background, she did all the speaking.   Heart rates remain fast.  There is no significant change since the clonidine was discontinued.  So we will resume TTS-1   COVID 19 screen The patient denies symptoms of COVID 19 at this time.  The importance of social distancing was discussed today.  Follow-up:  telehealth visit  3-4 months    Current medicines are reviewed at length with the patient today.   The patient  concerns regarding her medicines.  The following changes were made today:  resume clonidine TTS-1   Labs/ tests ordered today include:  No orders of the defined types were placed in this encounter.   Future tests ( post COVID )   in   months  Patient Risk:  after full review of this patients clinical status, I feel that they are at moderate risk at this time.  Today, I have spent 11 minutes with the patient with telehealth technology discussing the above.  Signed, Sherryl Manges, MD  02/02/2022 7:41 AM

## 2022-02-03 ENCOUNTER — Encounter (HOSPITAL_COMMUNITY): Payer: Self-pay | Admitting: Emergency Medicine

## 2022-02-03 ENCOUNTER — Other Ambulatory Visit: Payer: Self-pay | Admitting: Neurology

## 2022-02-03 ENCOUNTER — Other Ambulatory Visit: Payer: Self-pay

## 2022-02-03 ENCOUNTER — Emergency Department (HOSPITAL_COMMUNITY): Payer: BC Managed Care – PPO

## 2022-02-03 ENCOUNTER — Emergency Department (HOSPITAL_COMMUNITY)
Admission: EM | Admit: 2022-02-03 | Discharge: 2022-02-03 | Disposition: A | Discharge status: Home / Self Care | Payer: BC Managed Care – PPO | Attending: Emergency Medicine | Admitting: Emergency Medicine

## 2022-02-03 DIAGNOSIS — G43409 Hemiplegic migraine, not intractable, without status migrainosus: Secondary | ICD-10-CM | POA: Diagnosis not present

## 2022-02-03 DIAGNOSIS — R2990 Unspecified symptoms and signs involving the nervous system: Secondary | ICD-10-CM

## 2022-02-03 DIAGNOSIS — Z7982 Long term (current) use of aspirin: Secondary | ICD-10-CM | POA: Diagnosis not present

## 2022-02-03 DIAGNOSIS — R531 Weakness: Secondary | ICD-10-CM | POA: Diagnosis not present

## 2022-02-03 DIAGNOSIS — R202 Paresthesia of skin: Secondary | ICD-10-CM | POA: Diagnosis not present

## 2022-02-03 DIAGNOSIS — R519 Headache, unspecified: Secondary | ICD-10-CM | POA: Diagnosis present

## 2022-02-03 DIAGNOSIS — I639 Cerebral infarction, unspecified: Secondary | ICD-10-CM | POA: Insufficient documentation

## 2022-02-03 DIAGNOSIS — G43009 Migraine without aura, not intractable, without status migrainosus: Secondary | ICD-10-CM | POA: Diagnosis not present

## 2022-02-03 DIAGNOSIS — R569 Unspecified convulsions: Secondary | ICD-10-CM

## 2022-02-03 HISTORY — DX: Postural orthostatic tachycardia syndrome (POTS): G90.A

## 2022-02-03 HISTORY — DX: Unspecified atrial fibrillation: I48.91

## 2022-02-03 LAB — COMPREHENSIVE METABOLIC PANEL
ALT: 13 U/L (ref 0–44)
AST: 15 U/L (ref 15–41)
Albumin: 4.3 g/dL (ref 3.5–5.0)
Alkaline Phosphatase: 36 U/L — ABNORMAL LOW (ref 38–126)
Anion gap: 6 (ref 5–15)
BUN: 11 mg/dL (ref 6–20)
CO2: 24 mmol/L (ref 22–32)
Calcium: 9.5 mg/dL (ref 8.9–10.3)
Chloride: 109 mmol/L (ref 98–111)
Creatinine, Ser: 0.92 mg/dL (ref 0.44–1.00)
GFR, Estimated: >60 mL/min (ref >60)
Glucose, Bld: 88 mg/dL (ref 70–99)
Potassium: 3.5 mmol/L (ref 3.5–5.1)
Sodium: 139 mmol/L (ref 135–145)
Total Bilirubin: 0.5 mg/dL (ref 0.3–1.2)
Total Protein: 7.5 g/dL (ref 6.5–8.1)

## 2022-02-03 LAB — CBC
HCT: 38.8 % (ref 36.0–46.0)
Hemoglobin: 13.4 g/dL (ref 12.0–15.0)
MCH: 31.5 pg (ref 26.0–34.0)
MCHC: 34.5 g/dL (ref 30.0–36.0)
MCV: 91.1 fL (ref 80.0–100.0)
Platelets: 174 10*3/uL (ref 150–400)
RBC: 4.26 MIL/uL (ref 3.87–5.11)
RDW: 12.6 % (ref 11.5–15.5)
WBC: 6.2 10*3/uL (ref 4.0–10.5)
nRBC: 0 % (ref 0.0–0.2)

## 2022-02-03 LAB — DIFFERENTIAL
Abs Immature Granulocytes: 0.01 10*3/uL (ref 0.00–0.07)
Basophils Absolute: 0 10*3/uL (ref 0.0–0.1)
Basophils Relative: 0 %
Eosinophils Absolute: 0.4 10*3/uL (ref 0.0–0.5)
Eosinophils Relative: 7 %
Immature Granulocytes: 0 %
Lymphocytes Relative: 24 %
Lymphs Abs: 1.5 10*3/uL (ref 0.7–4.0)
Monocytes Absolute: 0.4 10*3/uL (ref 0.1–1.0)
Monocytes Relative: 6 %
Neutro Abs: 4 10*3/uL (ref 1.7–7.7)
Neutrophils Relative %: 63 %

## 2022-02-03 LAB — PROTIME-INR
INR: 1 (ref 0.8–1.2)
Prothrombin Time: 12.9 seconds (ref 11.4–15.2)

## 2022-02-03 LAB — CBG MONITORING, ED: Glucose-Capillary: 73 mg/dL (ref 70–99)

## 2022-02-03 LAB — APTT: aPTT: 25 seconds (ref 24–36)

## 2022-02-03 LAB — ETHANOL: Alcohol, Ethyl (B): <10 mg/dL (ref <10)

## 2022-02-03 MED ORDER — PROCHLORPERAZINE EDISYLATE 10 MG/2ML IJ SOLN: 10.0000 mg | Freq: Once | INTRAMUSCULAR | Status: DC

## 2022-02-03 MED ORDER — KETOROLAC TROMETHAMINE 30 MG/ML IJ SOLN: 15.0000 mg | Freq: Once | INTRAMUSCULAR | Status: DC

## 2022-02-03 MED ORDER — METOCLOPRAMIDE HCL 5 MG/ML IJ SOLN
10.0000 mg | Freq: Once | INTRAMUSCULAR | Status: AC
  Administered 2022-02-03: 10 mg via INTRAVENOUS
  Filled 2022-02-03: qty 2

## 2022-02-03 MED ORDER — DIPHENHYDRAMINE HCL 50 MG/ML IJ SOLN
12.5000 mg | Freq: Once | INTRAMUSCULAR | Status: AC
  Administered 2022-02-03: 12.5 mg via INTRAVENOUS
  Filled 2022-02-03: qty 1

## 2022-02-03 NOTE — ED Notes (Signed)
Back from CT at this time.

## 2022-02-03 NOTE — ED Notes (Signed)
To MRI

## 2022-02-03 NOTE — ED Provider Notes (Addendum)
  Physical Exam  BP 110/72   Pulse 72   Temp 98.8 F (37.1 C) (Oral)   Resp 16   Ht 5\' 3"  (1.6 m)   Wt 56.7 kg   LMP 05/04/2011   SpO2 100%   BMI 22.13 kg/m   Physical Exam  Procedures  Procedures  ED Course / MDM   Clinical Course as of 02/03/22 1719  Wed Feb 03, 2022  1359 Case discussed with radiology.  Head CT negative for acute findings [JK]  1440 DIscussed with Dr Feb 05, 2022.  Likely complex migraine. MRI if normal, ok for discharge [JK]  1611 CBC Normal [JK]  1611 Comprehensive metabolic panel(!) nl [JK]    Clinical Course User Index [JK] Selina Cooley, MD   Medical Decision Making Amount and/or Complexity of Data Reviewed Labs: ordered. Decision-making details documented in ED Course. Radiology: ordered.  Risk Prescription drug management.   36 y/o F with hx of headache, numbness, weakness on right side and speech disturbance. Came in as code stroke. MRI today is neg for stroke Neuro assessed. Pt has likely complex migraine. Headache cocktail ordered.    36, MD 02/03/22 1605  Results of MRI discussed with the patient and family.  She has received the medications in the ED.  Still has subtle right-sided weakness with grip and mild right-sided tongue deviation and subjective right forearm numbness.  Family and the patient indicates that she had a seizure prior to this episode.  In addition to the complex migraine, Todd's paralysis is in the differential. They are trying to get into neurology.  I will put in another referral to [facility] neurology.    [facility], MD 02/03/22 1721

## 2022-02-03 NOTE — Consult Note (Signed)
NEUROLOGY TELECONSULTATION NOTE   Date of service: February 03, 2022 Patient Name: Tara Mahoney MRN:  [id number] DOB:  1985/08/29 Reason for consult: telestroke  Requesting Provider: Dr. Linwood Dibbles Consult Participants: myself, patient, bedside RN, telestroke RN Location of the provider: [facility] Location of the patient: [facility]  This consult was provided via telemedicine with 2-way video and audio communication. The patient/family was informed that care would be provided in this way and agreed to receive care in this manner.   _ _ _   _ __   _ __ _ _  __ __   _ __   __ _  History of Present Illness   This is a 36 yo woman with pmhx dysautonomia, POTS, prior subcentimeter cerebellar infarct, PNES, migraines who presents with typical migraine headache + new sx of R sided weakness and numbness. NIHSS = 3 for sensory deficit and subtle drift in RUE and RLE. LKW 1315 today. Weakness has improved since onset. CT head NAICP, ASPECTS 10, personal review. TNK was not administered 2/2 mild and rapidly improving sx and suspicion for atypical migraine as the etiology not stroke. Exam not c/w LVO therefore CTA not performed as part of the stroke code.  Patient admitted to [facility] last month for spell characterization for episodes of bilateral arm shaking and unresponsiveness. 4 spells were captured, all of which were nonepileptic.   ROS   Per HPI; all other systems reviewed and are negative  Past History   The following was personally reviewed:  Past Medical History:  Diagnosis Date   Atrial fibrillation (HCC)    Blood transfusion without reported diagnosis    Complication of anesthesia    Dysautonomia (HCC)    sinus arrythmia   Family history of anesthesia complication    PONV   Headache(784.0)    Heart murmur    MVP (mitral valve prolapse)    PONV (postoperative nausea and vomiting)    POTS (postural orthostatic tachycardia syndrome)    Stroke (HCC) 11/08/2021   Ventricular tachycardia, nonsustained  [facility])    Past Surgical History:  Procedure Laterality Date   ABDOMINAL HYSTERECTOMY     CERVICAL CERCLAGE  2008   CERVICAL CERCLAGE  09/08/2011   Procedure: CERCLAGE CERVICAL;  Surgeon: Lazaro Arms, MD;  Location: [facility];  Service: Gynecology;  Laterality: N/A;  [name],CST-scrubbed; doctor scrubbed in @ [phone]   LAPAROSCOPIC LYSIS OF ADHESIONS  08/09/2012   Procedure: LAPAROSCOPIC LYSIS OF ADHESIONS;  Surgeon: Lazaro Arms, MD;  Location: [facility];  Service: Gynecology;  Laterality: N/A;   LAPAROSCOPY  08/09/2012   Procedure: LAPAROSCOPY DIAGNOSTIC;  Surgeon: Lazaro Arms, MD;  Location: [facility];  Service: Gynecology;  Laterality: N/A;  started at  1208-Vaginal trachelectomy (vaginal removal of cervical stump)    MYRINGOTOMY     SUPRACERVICAL ABDOMINAL HYSTERECTOMY  01/26/2012   Procedure: HYSTERECTOMY SUPRACERVICAL ABDOMINAL;  Surgeon: Lazaro Arms, MD;  Location: [facility];  Service: Gynecology;;  converted [phone]   Family History  Problem Relation Age of Onset   Hypertension Brother    Heart disease Brother        hole in heart   Ovarian cancer Mother    Anesthesia problems Neg Hx    Hypotension Neg Hx    Pseudochol deficiency Neg Hx    Malignant hyperthermia Neg Hx    Other Neg Hx    Social History   Socioeconomic History   Marital status: Married    Spouse name: Not on file  Number of children: 1   Years of education: Not on file   Highest education level: Not on file  Occupational History   Occupation: unemployed  Tobacco Use   Smoking status: Former    Years: 1.00    Types: Cigarettes    Start date: 10/01/2001    Quit date: 09/01/2009    Years since quitting: 12.4    Passive exposure: Never   Smokeless tobacco: Never  Vaping Use   Vaping Use: Never used  Substance and Sexual Activity   Alcohol use: No    Alcohol/week: 0.0 standard drinks of alcohol   Drug use: No   Sexual activity: Yes    Partners: Male    Birth control/protection: Surgical  Other  Topics Concern   Not on file  Social History Narrative   Right handed can use left    Social Determinants of Health   Financial Resource Strain: Not on file  Food Insecurity: Not on file  Transportation Needs: Not on file  Physical Activity: Not on file  Stress: Not on file  Social Connections: Not on file   Allergies  Allergen Reactions   Calan [Verapamil] Other (See Comments)    Racing heart Dizzyness Headaches Body tremors   Coreg [Carvedilol] Hives and Itching   Definity [Perflutren Lipid Microsphere] Other (See Comments)    Headache   Morphine And Related Nausea And Vomiting   Norco [Hydrocodone-Acetaminophen] Itching and Nausea And Vomiting   Beta Adrenergic Blockers Hives, Itching, Nausea And Vomiting and Rash    Pt has reactions to: Atenolol Carvedilol Metoprolol Nebivolol    Bystolic [Nebivolol Hcl] Rash        Cardizem Cd [Diltiazem Hcl Er Beads] Rash   Tenormin [Atenolol] Itching and Rash   Toprol Xl [Metoprolol] Rash    Headaches Dizziness     Medications   (Not in a hospital admission)     Current Facility-Administered Medications:    ketorolac (TORADOL) 30 MG/ML injection 15 mg, 15 mg, Intravenous, Once, Linwood Dibbles, MD   prochlorperazine (COMPAZINE) injection 10 mg, 10 mg, Intravenous, Once, Linwood Dibbles, MD  Current Outpatient Medications:    aspirin 81 MG chewable tablet, Chew 1 tablet (81 mg total) by mouth daily., Disp: , Rfl:    cloNIDine (CATAPRES - DOSED IN MG/24 HR) 0.1 mg/24hr patch, Place 1 patch (0.1 mg total) onto the skin once a week., Disp: 4 patch, Rfl: 12   flecainide (TAMBOCOR) 50 MG tablet, Take 1 tablet (50 mg total) by mouth 2 (two) times daily., Disp: 180 tablet, Rfl: 3   FLUoxetine (PROZAC) 40 MG capsule, TAKE 1 CAPSULE (40 MG TOTAL) BY MOUTH DAILY., Disp: 90 capsule, Rfl: 0   LORazepam (ATIVAN) 0.5 MG tablet, Take 1 tablet once a day as needed for anxiety, Disp: 10 tablet, Rfl: 5   Magnesium Oxide 400 MG CAPS, Take 1 capsule  (400 mg total) by mouth daily., Disp: 90 capsule, Rfl: 0   Multiple Vitamin (MULTIVITAMIN WITH MINERALS) TABS tablet, Take 1 tablet by mouth daily., Disp: , Rfl:    simvastatin (ZOCOR) 10 MG tablet, Take 10 mg by mouth at bedtime., Disp: , Rfl:    SUMAtriptan (IMITREX) 50 MG tablet, Take 2 tablets (100 mg total) by mouth every 2 (two) hours as needed for migraine. May repeat in 2 hours if headache persists or recurs.  Max dose 200 mg/day, Disp: 9 tablet, Rfl: 12   topiramate (TOPAMAX) 100 MG tablet, Take 1 tablet (100 mg total) by mouth 2 (two)  times daily., Disp: 60 tablet, Rfl: 3  Vitals   Vitals:   02/03/22 1405 02/03/22 1420 02/03/22 1430 02/03/22 1445  BP: 112/70 117/81 113/81 114/82  Pulse: 80 77 74 72  Resp: 17 15 12 16   Temp:      TempSrc:      SpO2: 99% 99% 100% 100%  Weight:      Height:         Body mass index is 22.13 kg/m.  Physical Exam   Exam performed over telemedicine with 2-way video and audio communication and with assistance of bedside RN  Physical Exam Gen: A&O x4, NAD Resp: normal WOB CV: extremities appear well-perfused  Neuro: *MS: A&O x4. Follows multi-step commands.  *Speech: nondysarthric, no aphasia, able to name and repeat *CN: PERRL 53mm, EOMI, VFF by confrontation, sensation impaired R side, smile symmetric, hearing intact to voice *Motor:   Normal bulk.  No tremor, rigidity or bradykinesia. Mild drift RUE and RLE but not to bed. LUE and LLE no drift with apparent full strength. *Sensory: R side sensory deficit to LT. No extinction to DSS. *Coordination:  Finger-to-nose, heel-to-shin, rapid alternating motions were intact. *Reflexes:  UTA 2/2 tele-exam *Gait: deferred  NIHSS = 3 for sensory deficit and drift in both RUE and RLE   Premorbid mRS = 0   Labs   CBC:  Recent Labs  Lab 02/03/22 1350  WBC 6.2  NEUTROABS 4.0  HGB 13.4  HCT 38.8  MCV 91.1  PLT 174    Basic Metabolic Panel:  Lab Results  Component Value Date   NA 139  02/03/2022   K 3.5 02/03/2022   CO2 24 02/03/2022   GLUCOSE 88 02/03/2022   BUN 11 02/03/2022   CREATININE 0.92 02/03/2022   CALCIUM 9.5 02/03/2022   GFRNONAA >60 02/03/2022   GFRAA >60 09/29/2016   Lipid Panel:  Lab Results  Component Value Date   LDLCALC 77 12/31/2021   HgbA1c:  Lab Results  Component Value Date   HGBA1C 5.2 09/17/2015   Urine Drug Screen:     Component Value Date/Time   LABOPIA NONE DETECTED 01/02/2022 1321   COCAINSCRNUR NONE DETECTED 01/02/2022 1321   LABBENZ NONE DETECTED 01/02/2022 1321   AMPHETMU NONE DETECTED 01/02/2022 1321   THCU NONE DETECTED 01/02/2022 1321   LABBARB NONE DETECTED 01/02/2022 1321    Alcohol Level     Component Value Date/Time   ETH <10 02/03/2022 1350     Impression   This is a 36 yo woman with pmhx dysautonomia, POTS, prior subcentimeter cerebellar infarct, PNES, migraines who presents with typical migraine headache + new sx of R sided weakness and numbness. NIHSS = 3 for sensory deficit and subtle drift in RUE and RLE. LKW 1315 today. Weakness has improved since onset. CT head NAICP, ASPECTS 10, personal review. TNK was not administered 2/2 mild and rapidly improving sx. Favor atypical migraine as etiology of current sx.  Recommendations   - Monitor patient with vital signs q 15 min and NIHSS q 30 min until patient is outside the window (1745). If neurologic deficits worsen re-activate telestroke - Migraine cocktail in ED - MRI brain wo contrast - if normal, patient can be discharged from ED ______________________________________________________________________   Thank you for the opportunity to take part in the care of this patient. If you have any further questions, please contact the neurology consultation attending.  Signed,  20, MD [facility] [phone]  If 7pm- 7am, please page neurology on call  as listed in AMION.

## 2022-02-03 NOTE — ED Notes (Signed)
To CT at this time.

## 2022-02-03 NOTE — ED Triage Notes (Signed)
Per husband, pt has had a stroke approx 125 pm, slurred speech and shaking.

## 2022-02-03 NOTE — ED Provider Notes (Signed)
[facility] EMERGENCY DEPARTMENT Provider Note   CSN: [id number] Arrival date & time: 02/03/22  1337  An emergency department physician performed an initial assessment on this suspected stroke patient at 1350.  History  Chief Complaint  Patient presents with  . Stroke Symptoms    Tara Mahoney is a 36 y.o. female.  HPI Patient has a history of migraines, mitral valve prolapse, and prior stroke.  Patient states she had sudden onset of slurred speech headache facial numbness on the right side that started about 1:25 PM.  Patient states she has history of migraines but this feels different.  She previously had an evaluation and was told she had a stroke in the past.  Patient feels weakness on her right arm and right leg.    Home Medications Prior to Admission medications   Medication Sig Start Date End Date Taking? Authorizing Provider  aspirin 81 MG chewable tablet Chew 1 tablet (81 mg total) by mouth daily. 01/01/22  Yes Tat, Onalee Hua, MD  cloNIDine (CATAPRES - DOSED IN MG/24 HR) 0.1 mg/24hr patch Place 1 patch (0.1 mg total) onto the skin once a week. 02/02/22  Yes Duke Salvia, MD  flecainide (TAMBOCOR) 50 MG tablet Take 1 tablet (50 mg total) by mouth 2 (two) times daily. 09/22/21  Yes Duke Salvia, MD  FLUoxetine (PROZAC) 40 MG capsule TAKE 1 CAPSULE (40 MG TOTAL) BY MOUTH DAILY. 02/03/22  Yes Van Clines, MD  LORazepam (ATIVAN) 0.5 MG tablet Take 1 tablet once a day as needed for anxiety 01/12/22  Yes Van Clines, MD  Magnesium Oxide 400 MG CAPS Take 1 capsule (400 mg total) by mouth daily. 08/21/21  Yes Duke Salvia, MD  Multiple Vitamin (MULTIVITAMIN WITH MINERALS) TABS tablet Take 1 tablet by mouth daily.   Yes [provider]  simvastatin (ZOCOR) 10 MG tablet Take 10 mg by mouth at bedtime. 10/01/21  Yes [provider]  SUMAtriptan (IMITREX) 50 MG tablet Take 2 tablets (100 mg total) by mouth every 2 (two) hours as needed for migraine. May repeat in 2  hours if headache persists or recurs.  Max dose 200 mg/day 09/16/21  Yes Rodolph Bong, MD  topiramate (TOPAMAX) 100 MG tablet Take 1 tablet (100 mg total) by mouth 2 (two) times daily. 09/16/21  Yes Rodolph Bong, MD      Allergies    Calan [verapamil], Coreg [carvedilol], Definity [perflutren lipid microsphere], Morphine and related, Norco [hydrocodone-acetaminophen], Beta adrenergic blockers, Bystolic [nebivolol hcl], Cardizem cd [diltiazem hcl er beads], Tenormin [atenolol], and Toprol xl [metoprolol]    Review of Systems   Review of Systems  Physical Exam Updated Vital Signs BP 110/72   Pulse 72   Temp 98.8 F (37.1 C) (Oral)   Resp 16   Ht 1.6 m (5\' 3" )   Wt 56.7 kg   LMP 05/04/2011   SpO2 100%   BMI 22.13 kg/m  Physical Exam Vitals and nursing note reviewed.  Constitutional:      General: She is not in acute distress.    Appearance: She is well-developed.  HENT:     Head: Normocephalic and atraumatic.     Right Ear: External ear normal.     Left Ear: External ear normal.  Eyes:     General: No scleral icterus.       Right eye: No discharge.        Left eye: No discharge.     Conjunctiva/sclera: Conjunctivae normal.  Neck:  Trachea: No tracheal deviation.  Cardiovascular:     Rate and Rhythm: Normal rate and regular rhythm.  Pulmonary:     Effort: Pulmonary effort is normal. No respiratory distress.     Breath sounds: Normal breath sounds. No stridor. No wheezing or rales.  Abdominal:     General: Bowel sounds are normal. There is no distension.     Palpations: Abdomen is soft.     Tenderness: There is no abdominal tenderness. There is no guarding or rebound.  Musculoskeletal:        General: No tenderness.     Cervical back: Neck supple.  Skin:    General: Skin is warm and dry.     Findings: No rash.  Neurological:     Mental Status: She is alert and oriented to person, place, and time.     Cranial Nerves: No cranial nerve deficit or dysarthria.      Sensory: No sensory deficit.     Motor: No abnormal muscle tone or seizure activity.     Coordination: Coordination normal.     Comments: Weakness right upper extremity, decreased grip strength, able to hold both legs off bed for 5 seconds, sensation intact in all extremities, no visual field cuts, no left or right sided neglect, normal finger-nose exam bilaterally, no nystagmus noted  No facial droop, extraocular movements intact, tongue deviates to the right  No slurred speech noted     ED Results / Procedures / Treatments   Labs (all labs ordered are listed, but only abnormal results are displayed) Labs Reviewed  COMPREHENSIVE METABOLIC PANEL - Abnormal; Notable for the following components:      Result Value   Alkaline Phosphatase 36 (*)    All other components within normal limits  PROTIME-INR  APTT  CBC  DIFFERENTIAL  ETHANOL  I-STAT CHEM 8, ED  CBG MONITORING, ED    EKG None  Radiology MR BRAIN WO CONTRAST  Result Date: 02/03/2022 CLINICAL DATA:  Neuro deficit, acute, stroke suspected. EXAM: MRI HEAD WITHOUT CONTRAST TECHNIQUE: Multiplanar, multiecho pulse sequences of the brain and surrounding structures were obtained without intravenous contrast. COMPARISON:  Head CT February 03, 2022. FINDINGS: Brain: No acute infarction, hemorrhage, hydrocephalus, extra-axial collection or mass lesion. Small remote infarct in the inferior left cerebellar hemisphere. Vascular: Normal flow voids. Skull and upper cervical spine: Normal marrow signal. Sinuses/Orbits: Negative. Other: None. IMPRESSION: 1. No acute intracranial abnormality. 2. Remote small infarct in the inferior left cerebellar hemisphere. Electronically Signed   By: Baldemar Lenis M.D.   On: 02/03/2022 15:14   CT HEAD CODE STROKE WO CONTRAST  Result Date: 02/03/2022 CLINICAL DATA:  Code stroke.  Slurred speech and shaking EXAM: CT HEAD WITHOUT CONTRAST TECHNIQUE: Contiguous axial images were obtained from the  base of the skull through the vertex without intravenous contrast. RADIATION DOSE REDUCTION: This exam was performed according to the departmental dose-optimization program which includes automated exposure control, adjustment of the mA and/or kV according to patient size and/or use of iterative reconstruction technique. COMPARISON:  01/02/2022 CTA head and neck FINDINGS: Brain: No evidence of acute infarction, hemorrhage, cerebral edema, mass, mass effect, or midline shift. No hydrocephalus or extra-axial collection. Vascular: No hyperdense vessel. Skull: Negative for fracture or focal lesion. Sinuses/Orbits: No acute finding. Other: The mastoid air cells are well aerated. ASPECTS [facility] Stroke Program Early CT Score) - Ganglionic level infarction (caudate, lentiform nuclei, internal capsule, insula, M1-M3 cortex): 7 - Supraganglionic infarction (M4-M6 cortex): 3 Total score (  0-10 with 10 being normal): 10 IMPRESSION: 1. No acute intracranial process. 2. ASPECTS is 10 Code stroke imaging results were communicated on 02/03/2022 at 1:59 pm to provider Kimyatta Lecy via telephone, who verbally acknowledged these results. Electronically Signed   By: Wiliam Ke M.D.   On: 02/03/2022 14:01    Procedures Procedures    Medications Ordered in ED Medications  ketorolac (TORADOL) 30 MG/ML injection 15 mg (15 mg Intravenous Not Given 02/03/22 1449)  metoCLOPramide (REGLAN) injection 10 mg (10 mg Intravenous Given 02/03/22 1551)  diphenhydrAMINE (BENADRYL) injection 12.5 mg (12.5 mg Intravenous Given 02/03/22 1551)    ED Course/ Medical Decision Making/ A&P Clinical Course as of 02/03/22 1612  Wed Feb 03, 2022  1359 Case discussed with radiology.  Head CT negative for acute findings [JK]  1440 DIscussed with Dr Selina Cooley.  Likely complex migraine. MRI if normal, ok for discharge [JK]  1611 CBC Normal [JK]  1611 Comprehensive metabolic panel(!) nl [JK]    Clinical Course User Index [JK] Linwood Dibbles, MD                            Medical Decision Making Frontal diagnosis includes but not limited to, subarachnoid hemorrhage, subdural hematoma, stroke, complex migraine.  Code stroke activated  Problems Addressed: Hemiplegic migraine without status migrainosus, not intractable: acute illness or injury that poses a threat to life or bodily functions  Amount and/or Complexity of Data Reviewed Labs: ordered. Radiology: ordered.  Risk Prescription drug management.   Patient presented to the ED for evaluation of cute onset of numbness weakness.  Concerning possibility of acute stroke.  CT scan did not show any acute findings.  MRI does not show any evidence of stroke.  Patient was seen by Dr. Selina Cooley of neurology on arrival.  Suspect this is a complex migraine.  Patient's laboratory tests are unremarkable .  patient will be given a migraine cocktail.  Anticipate we will be able to discharge if she has some symptomatic improvement.  Care turned over to Dr. Rhunette Croft         Final Clinical Impression(s) / ED Diagnoses Final diagnoses:  Hemiplegic migraine without status migrainosus, not intractable    Rx / DC Orders ED Discharge Orders     None         Linwood Dibbles, MD 02/03/22 1612

## 2022-02-03 NOTE — ED Notes (Signed)
Code stroke activated at 1348

## 2022-02-03 NOTE — Discharge Instructions (Addendum)
MRI is negative for any stroke.  Complex headache versus postseizure complication.  Neurology will assist further in the diagnosis and management. I have put in a referral to [facility] neurology.  [place] law prevents people with seizures or fainting from driving or operating dangerous machinery until they are free of seizures or fainting for 6 months.

## 2022-02-09 ENCOUNTER — Telehealth: Payer: Self-pay | Admitting: Neurology

## 2022-02-09 NOTE — Telephone Encounter (Signed)
With weakness on one side and not being back to baseline, would go to ER, thanks

## 2022-02-09 NOTE — Telephone Encounter (Signed)
Called pateint and she is going to [facility] in [place] as soon as she can. I did tell her the importance of getting there ASAP

## 2022-02-09 NOTE — Telephone Encounter (Signed)
Pt called in stating she has been having seizures and when she wakes up she can't move her left side of her body, her face droops on that side and she cannot open her eye.

## 2022-02-09 NOTE — Telephone Encounter (Signed)
Spoke to patient who feels she is having up to 5 seizures a day and some at night her daughter will wake her up. Left side arm weak and left leg. Arm and hand in locked position and when she try's to open her hand it shakes . She does not feel she has gotten back to baseline at all over the past 5 days

## 2022-02-13 LAB — H PYLORI, IGM, IGG, IGA AB
H Pylori IgG: 6.09 Index Value — ABNORMAL HIGH (ref 0.00–0.79)
H. Pylogi, Iga Abs: <9 units (ref 0.0–8.9)
H. Pylogi, Igm Abs: <9 units (ref 0.0–8.9)

## 2022-02-14 NOTE — Progress Notes (Unsigned)
[facility] [address], [place] [address]   CLINIC:  Medical Oncology/Hematology  PCP:  Ponciano Ort, [facility] [address] [place] [address] [phone]   REASON FOR VISIT:  Follow-up for thrombocytopenia  PRIOR THERAPY: None  CURRENT THERAPY: Under work-up  INTERVAL HISTORY:  [name] 36 y.o. female returns for routine follow-up of her thrombocytopenia.  She was seen for initial consultation by Dr. Ellin Saba on 01/22/2022.  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  She denies any changes to her health or symptoms since her initial consultation 3 weeks ago.  She reports she does not get menses as she had a partial hysterectomy due to heavy bleeding immediately following the birth of her last child. She reports she does occasionally have nosebleeds but she has not had one "in a while". She reports she has night sweats. She denies skin rashes and joint pains. She denies spontaneous bleeding or bruising.  She denies any fevers, chills, or unintentional weight loss.  She has 50% energy and 50% appetite. She endorses that she is maintaining a stable weight.   REVIEW OF SYSTEMS:  Review of Systems  Constitutional:  Positive for fatigue. Negative for appetite change, chills, diaphoresis, fever and unexpected weight change.  HENT:   Negative for lump/mass and nosebleeds.   Eyes:  Negative for eye problems.  Respiratory:  Positive for shortness of breath. Negative for cough and hemoptysis.   Cardiovascular:  Positive for chest pain (none today) and palpitations. Negative for leg swelling.  Gastrointestinal:  Negative for abdominal pain, blood in stool, constipation, diarrhea, nausea and vomiting.  Genitourinary:  Negative for hematuria.   Skin: Negative.   Neurological:  Positive for headaches, numbness and seizures. Negative for  dizziness and light-headedness.  Hematological:  Does not bruise/bleed easily.  Psychiatric/Behavioral:  Positive for sleep disturbance.       PAST MEDICAL/SURGICAL HISTORY:  Past Medical History:  Diagnosis Date   Atrial fibrillation (HCC)    Blood transfusion without reported diagnosis    Complication of anesthesia    Dysautonomia (HCC)    sinus arrythmia   Family history of anesthesia complication    PONV   Headache(784.0)    Heart murmur    MVP (mitral valve prolapse)    PONV (postoperative nausea and vomiting)    POTS (postural orthostatic tachycardia syndrome)    Stroke (HCC) 11/08/2021   Ventricular tachycardia, nonsustained [facility])    Past Surgical History:  Procedure Laterality Date   ABDOMINAL HYSTERECTOMY     CERVICAL CERCLAGE  2008   CERVICAL CERCLAGE  09/08/2011   Procedure: CERCLAGE CERVICAL;  Surgeon: Lazaro Arms, MD;  Location: [facility];  Service: Gynecology;  Laterality: N/A;  [name],CST-scrubbed; doctor scrubbed in @ [phone]   LAPAROSCOPIC LYSIS OF ADHESIONS  08/09/2012   Procedure: LAPAROSCOPIC LYSIS OF ADHESIONS;  Surgeon: Lazaro Arms, MD;  Location: [facility];  Service: Gynecology;  Laterality: N/A;   LAPAROSCOPY  08/09/2012   Procedure: LAPAROSCOPY DIAGNOSTIC;  Surgeon: Lazaro Arms, MD;  Location: [facility];  Service: Gynecology;  Laterality: N/A;  started at  1208-Vaginal trachelectomy (vaginal removal of cervical stump)    MYRINGOTOMY     SUPRACERVICAL ABDOMINAL HYSTERECTOMY  01/26/2012   Procedure: HYSTERECTOMY SUPRACERVICAL ABDOMINAL;  Surgeon: Lazaro Arms, MD;  Location: [facility];  Service: Gynecology;;  converted [phone]     SOCIAL  HISTORY:  Social History   Socioeconomic History   Marital status: Married    Spouse name: Not on file   Number of children: 1   Years of education: Not on file   Highest education level: Not on file  Occupational History   Occupation: unemployed  Tobacco Use   Smoking status: Former    Years: 1.00    Types:  Cigarettes    Start date: 10/01/2001    Quit date: 09/01/2009    Years since quitting: 12.4    Passive exposure: Never   Smokeless tobacco: Never  Vaping Use   Vaping Use: Never used  Substance and Sexual Activity   Alcohol use: No    Alcohol/week: 0.0 standard drinks of alcohol   Drug use: No   Sexual activity: Yes    Partners: Male    Birth control/protection: Surgical  Other Topics Concern   Not on file  Social History Narrative   Right handed can use left    Social Determinants of Health   Financial Resource Strain: Not on file  Food Insecurity: Not on file  Transportation Needs: Not on file  Physical Activity: Not on file  Stress: Not on file  Social Connections: Not on file  Intimate Partner Violence: Not on file    FAMILY HISTORY:  Family History  Problem Relation Age of Onset   Hypertension Brother    Heart disease Brother        hole in heart   Ovarian cancer Mother    Anesthesia problems Neg Hx    Hypotension Neg Hx    Pseudochol deficiency Neg Hx    Malignant hyperthermia Neg Hx    Other Neg Hx     CURRENT MEDICATIONS:  Outpatient Encounter Medications as of 02/15/2022  Medication Sig   aspirin 81 MG chewable tablet Chew 1 tablet (81 mg total) by mouth daily.   cloNIDine (CATAPRES - DOSED IN MG/24 HR) 0.1 mg/24hr patch Place 1 patch (0.1 mg total) onto the skin once a week.   flecainide (TAMBOCOR) 50 MG tablet Take 1 tablet (50 mg total) by mouth 2 (two) times daily.   FLUoxetine (PROZAC) 40 MG capsule TAKE 1 CAPSULE (40 MG TOTAL) BY MOUTH DAILY.   LORazepam (ATIVAN) 0.5 MG tablet Take 1 tablet once a day as needed for anxiety   Magnesium Oxide 400 MG CAPS Take 1 capsule (400 mg total) by mouth daily.   Multiple Vitamin (MULTIVITAMIN WITH MINERALS) TABS tablet Take 1 tablet by mouth daily.   simvastatin (ZOCOR) 10 MG tablet Take 10 mg by mouth at bedtime.   SUMAtriptan (IMITREX) 50 MG tablet Take 2 tablets (100 mg total) by mouth every 2 (two) hours as  needed for migraine. May repeat in 2 hours if headache persists or recurs.  Max dose 200 mg/day   topiramate (TOPAMAX) 100 MG tablet Take 1 tablet (100 mg total) by mouth 2 (two) times daily.   No facility-administered encounter medications on file as of 02/15/2022.    ALLERGIES:  Allergies  Allergen Reactions   Calan [Verapamil] Other (See Comments)    Racing heart Dizzyness Headaches Body tremors   Coreg [Carvedilol] Hives and Itching   Definity [Perflutren Lipid Microsphere] Other (See Comments)    Headache   Morphine And Related Nausea And Vomiting   Norco [Hydrocodone-Acetaminophen] Itching and Nausea And Vomiting   Beta Adrenergic Blockers Hives, Itching, Nausea And Vomiting and Rash    Pt has reactions to: Atenolol Carvedilol Metoprolol Nebivolol  Bystolic [Nebivolol Hcl] Rash        Cardizem Cd [Diltiazem Hcl Er Beads] Rash   Tenormin [Atenolol] Itching and Rash   Toprol Xl [Metoprolol] Rash    Headaches Dizziness      PHYSICAL EXAM:  ECOG PERFORMANCE STATUS: 0 - Asymptomatic  There were no vitals filed for this visit. There were no vitals filed for this visit. Physical Exam Constitutional:      Appearance: Normal appearance.  HENT:     Head: Normocephalic and atraumatic.     Mouth/Throat:     Mouth: Mucous membranes are moist.  Eyes:     Extraocular Movements: Extraocular movements intact.     Pupils: Pupils are equal, round, and reactive to light.  Cardiovascular:     Rate and Rhythm: Normal rate and regular rhythm.     Pulses: Normal pulses.     Heart sounds: Normal heart sounds.  Pulmonary:     Effort: Pulmonary effort is normal.     Breath sounds: Normal breath sounds.  Abdominal:     General: Bowel sounds are normal.     Palpations: Abdomen is soft.     Tenderness: There is no abdominal tenderness.  Musculoskeletal:        General: No swelling.     Right lower leg: No edema.     Left lower leg: No edema.  Lymphadenopathy:     Cervical:  No cervical adenopathy.  Skin:    General: Skin is warm and dry.  Neurological:     General: No focal deficit present.     Mental Status: She is alert and oriented to person, place, and time.  Psychiatric:        Mood and Affect: Mood normal.        Behavior: Behavior normal.     LABORATORY DATA:  I have reviewed the labs as listed.  CBC    Component Value Date/Time   WBC 6.2 02/03/2022 1350   RBC 4.26 02/03/2022 1350   HGB 13.4 02/03/2022 1350   HCT 38.8 02/03/2022 1350   PLT 174 02/03/2022 1350   MCV 91.1 02/03/2022 1350   MCH 31.5 02/03/2022 1350   MCHC 34.5 02/03/2022 1350   RDW 12.6 02/03/2022 1350   LYMPHSABS 1.5 02/03/2022 1350   MONOABS 0.4 02/03/2022 1350   EOSABS 0.4 02/03/2022 1350   BASOSABS 0.0 02/03/2022 1350      Latest Ref Rng & Units 02/03/2022    1:50 PM 01/02/2022   10:56 AM 12/30/2021   10:58 AM  CMP  Glucose 70 - 99 mg/dL 88  95  95   BUN 6 - 20 mg/dL 11  18  17    Creatinine 0.44 - 1.00 mg/dL  4.09  8.11   Sodium 135 - 145 mmol/L 139  135  138   Potassium 3.5 - 5.1 mmol/L 3.5  3.5  4.0   Chloride 98 - 111 mmol/L 109  108  109   CO2 22 - 32 mmol/L 24  21  24    Calcium 8.9 - 10.3 mg/dL 9.5  9.1  9.1   Total Protein 6.5 - 8.1 g/dL 7.5  7.2  7.0   Total Bilirubin 0.3 - 1.2 mg/dL 0.5  0.6  0.5   Alkaline Phos 38 - 126 U/L 36  34  33   AST 15 - 41 U/L 15  16  16    ALT 0 - 44 U/L 13  13  14      DIAGNOSTIC IMAGING:  I have independently reviewed the relevant imaging and discussed with the patient.  ASSESSMENT & PLAN: 1.  Intermittent thrombocytopenia, mild - She has had mild to moderate thrombocytopenia since at least 2008, with platelets ranging from 82 to normal. - Lowest platelets (82 on 01/27/2012) occurred during hospitalization for C-section due to placenta previa, where she was hospitalized from 01/25/2012 through 01/27/2012.  For the most part, platelets have been >120. - She has been having seizures since April 2023, along with Todd's  paralysis and complex migraines.  She takes Imitrex and Topamax for migraines.  She is awaiting neurology consultation in the outpatient setting. - She had blood transfusion in 2013 after C-section birth of her daughter. - No history of connective tissue disorders or autoimmune disease.  She denies any skin rashes or joint pains.   - She reports that she "loves" walnuts, and eats them a few times each week.  She does not take any quinine containing supplements.   - Hematology work-up (01/22/2022): Positive for H. pylori IgG antibodies Negative hepatitis C and hepatitis B. Negative ANA, RF.  SPEP negative. Nutritional panel unremarkable.  Normal copper, methylmalonic acid.  B12 and folate normal when checked on 12/31/2021. Negative beta-2 glycoprotein 1 antibodies, negative lupus anticoagulant panel.  Weakly positive anticardiolipin antibody IgM at 15 is clinically insignificant. - Occasional epistaxis.  No spontaneous bruising or bleeding. She reports night sweats.  No fevers, chills, or unintentional weight loss.  - Most recent CBC (02/03/2022) is normal, with platelets 174 - PLAN: Differential diagnosis includes mild ITP (possibly related to H. pylori antibody) versus pseudothrombocytopenia from platelet clumping.  No indication for treatment at this time. - We will repeat CBC with RTC in 6 months.   - Patient is aware of alarm symptoms that would prompt immediate medical attention.    2.  Other history - She has been having seizures since April 2023, occurring nearly daily and presenting as full body tremors times several minutes followed by syncope and at times Todd's paralysis.  She also has complex migraines.  Taking Topamax and Imitrex for migraines following head injury in 2022. - She is married and seen with her husband today.   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  No chemicals or pesticides.  Not tobacco use. - Mother had uterine/ovarian  cancer.  Paternal aunt had breast cancer.   All questions were answered. The patient knows to call the clinic with any problems, questions or concerns.  Medical decision making: Low  Time spent on visit: I spent 15 minutes counseling the patient face to face. The total time spent in the appointment was 25 minutes and more than 50% was on counseling.   Carnella Guadalajara, PA-C  02/15/2022 9:10 AM

## 2022-02-15 ENCOUNTER — Inpatient Hospital Stay (HOSPITAL_BASED_OUTPATIENT_CLINIC_OR_DEPARTMENT_OTHER): Payer: BC Managed Care – PPO | Admitting: Physician Assistant

## 2022-02-15 VITALS — HR 91 | Temp 99.0°F | Resp 16 | Ht 64.25 in | Wt 126.1 lb

## 2022-02-15 DIAGNOSIS — D696 Thrombocytopenia, unspecified: Secondary | ICD-10-CM | POA: Diagnosis not present

## 2022-02-15 NOTE — Patient Instructions (Signed)
[facility] at [facility] Discharge Instructions  You were seen today by Rojelio Brenner PA-C for your low platelets.  As we discussed, your labs did not show any major abnormalities.  You did have positive antibodies for H. pylori, which means that you may have previously had an H. pylori infection.  Sometimes, H. pylori infection can cause mild immune system dysfunction that causes your platelets to be attacked by your immune system.  This may be the cause of your low platelets, but it is difficult to know for sure.  It may also be lab error causing falsely low platelets, or be related to your high consumption of walnuts.  At this time, your platelets are normal.  You do not need any treatment for your low platelets at this time.  We will continue to check your platelets with repeat blood count in 6 months.  FOLLOW-UP APPOINTMENT: Same-day labs and office visit in 6 months   - - - - - - - - - - - - - - - - - -    Thank you for choosing [facility] at [facility] to provide your oncology and hematology care.  To afford each patient quality time with our provider, please arrive at least 15 minutes before your scheduled appointment time.   If you have a lab appointment with the Cancer Center please come in thru the Main Entrance and check in at the main information desk.  You need to re-schedule your appointment should you arrive 10 or more minutes late.  We strive to give you quality time with our providers, and arriving late affects you and other patients whose appointments are after yours.  Also, if you no show three or more times for appointments you may be dismissed from the clinic at the providers discretion.     Again, thank you for choosing [facility].  Our hope is that these requests will decrease the amount of time that you wait before being seen by our physicians.        _____________________________________________________________  Should you have questions after your visit to [facility], please contact our office at [phone] and follow the prompts.  Our office hours are 8:00 a.m. and 4:30 p.m. Monday - Friday.  Please note that voicemails left after 4:00 p.m. may not be returned until the following business day.  We are closed weekends and major holidays.  You do have access to a nurse 24-7, just call the main number to the clinic [phone] and do not press any options, hold on the line and a nurse will answer the phone.    For prescription refill requests, have your pharmacy contact our office and allow 72 hours.    Due to Covid, you will need to wear a mask upon entering the hospital. If you do not have a mask, a mask will be given to you at the Main Entrance upon arrival. For doctor visits, patients may have 1 support person age 38 or older with them. For treatment visits, patients can not have anyone with them due to social distancing guidelines and our immunocompromised population.

## 2022-02-20 ENCOUNTER — Other Ambulatory Visit: Payer: Self-pay | Admitting: Family Medicine

## 2022-02-22 NOTE — Telephone Encounter (Signed)
Rx refill request approved per Dr. Corey's orders. 

## 2022-04-14 ENCOUNTER — Ambulatory Visit: Payer: BC Managed Care – PPO | Admitting: Neurology

## 2022-04-20 ENCOUNTER — Other Ambulatory Visit: Payer: Self-pay | Admitting: Family Medicine

## 2022-04-20 NOTE — Telephone Encounter (Signed)
Rx refill request approved per Dr. Corey's orders. 

## 2022-04-27 ENCOUNTER — Other Ambulatory Visit: Payer: Self-pay | Admitting: Neurology

## 2022-06-18 ENCOUNTER — Ambulatory Visit (HOSPITAL_COMMUNITY): Payer: BC Managed Care – PPO | Attending: Internal Medicine

## 2022-06-18 DIAGNOSIS — I059 Rheumatic mitral valve disease, unspecified: Secondary | ICD-10-CM | POA: Diagnosis not present

## 2022-06-18 LAB — ECHOCARDIOGRAM COMPLETE
Area-P 1/2: 4.74 cm2
MV M vel: 5.74 m/s
MV Peak grad: 131.9 mmHg
Radius: 0.7 cm
S' Lateral: 3.3 cm

## 2022-06-30 ENCOUNTER — Other Ambulatory Visit: Payer: Self-pay | Admitting: Neurology

## 2022-07-13 ENCOUNTER — Encounter: Payer: Self-pay | Admitting: Internal Medicine

## 2022-07-13 ENCOUNTER — Ambulatory Visit: Payer: BC Managed Care – PPO | Attending: Internal Medicine | Admitting: Internal Medicine

## 2022-07-13 VITALS — BP 114/86 | HR 81 | Ht 63.0 in | Wt 144.8 lb

## 2022-07-13 DIAGNOSIS — R002 Palpitations: Secondary | ICD-10-CM | POA: Diagnosis not present

## 2022-07-13 DIAGNOSIS — J45901 Unspecified asthma with (acute) exacerbation: Secondary | ICD-10-CM

## 2022-07-13 DIAGNOSIS — G909 Disorder of the autonomic nervous system, unspecified: Secondary | ICD-10-CM | POA: Diagnosis not present

## 2022-07-13 NOTE — Progress Notes (Signed)
Patient Care Team: Pllc, [facility] as PCP - General Deboraha Sprang, MD as PCP - Electrophysiology (Cardiology) Harl Bowie Alphonse Guild, MD as Consulting Physician (Cardiology) Evans Lance, MD as Consulting Physician (Cardiology) Cameron Sprang, MD as Consulting Physician (Neurology) Derek Jack, MD as Medical Oncologist (Hematology)   HPI  Tara Mahoney is a 36 y.o. 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  Multiple events occurred with negative EEGs.  She was supine for most.  Her eyes were noted to be half open at the initial event.  No other work comments were made about that.  Moderate dyspnea on exertion particularly in the cold. Palpitations are relatively quiescient as is tachycardia tolerating, the flecainide and the      DATE TEST EF    8/17 cMRI 48 % ?  Noncompaction  9/17 Echo  50-55 %    3/18 Echo  55 %     3/23 Echo   75% MR-moderate-eccentric         Date Cr K Hgb  1/23 0.82 3.3 14.1   7/23 0.92 3.5 13.4        Records and Results Reviewed   Losing weight since the beginning of lost about 10 pounds which NOAC  Past Medical History:  Diagnosis Date   Atrial fibrillation ([place])    Blood transfusion without reported diagnosis    Complication of anesthesia     Dysautonomia ([place])    sinus arrythmia   Family history of anesthesia complication    PONV   [id number])    Heart murmur    MVP (mitral valve prolapse)    PONV (postoperative nausea and vomiting)    POTS (postural orthostatic tachycardia syndrome)    Stroke ([place]) 11/08/2021   Ventricular tachycardia, nonsustained [facility])     Past Surgical History:  Procedure Laterality Date   ABDOMINAL HYSTERECTOMY     CERVICAL CERCLAGE  2008   CERVICAL CERCLAGE  09/08/2011   Procedure: CERCLAGE CERVICAL;  Surgeon: Florian Buff, MD;  Location: [facility];  Service: Gynecology;  Laterality: N/A;  [name],CST-scrubbed; doctor scrubbed in @ [place]  08/09/2012   Procedure: LAPAROSCOPIC LYSIS OF ADHESIONS;  Surgeon: Florian Buff, MD;  Location: [facility];  Service: Gynecology;  Laterality: N/A;   LAPAROSCOPY  08/09/2012   Procedure: LAPAROSCOPY DIAGNOSTIC;  Surgeon: Florian Buff, MD;  Location: [facility];  Service: Gynecology;  Laterality: N/A;  started at  1208-Vaginal trachelectomy (vaginal removal of cervical stump)    MYRINGOTOMY     SUPRACERVICAL ABDOMINAL HYSTERECTOMY  01/26/2012   Procedure: HYSTERECTOMY SUPRACERVICAL ABDOMINAL;  Surgeon: Florian Buff, MD;  Location: [place] [facility];  Service: Gynecology;;  converted [phone]    Current Meds  Medication Sig   Ascorbic Acid (VITAMIN C PO)    aspirin 81 MG chewable  tablet Chew 1 tablet (81 mg total) by mouth daily.   cloNIDine (CATAPRES - DOSED IN MG/24 HR) 0.1 mg/24hr patch Place 1 patch (0.1 mg total) onto the skin once a week.   flecainide (TAMBOCOR) 50 MG tablet Take 1 tablet (50 mg total) by mouth 2 (two) times daily.   FLUoxetine (PROZAC) 40 MG capsule TAKE 1 CAPSULE (40 MG TOTAL) BY MOUTH DAILY.   gabapentin (NEURONTIN) 300 MG capsule Take by mouth.   loratadine (CLARITIN) 10 MG tablet Take 10 mg by mouth daily.   LORazepam (ATIVAN) 0.5 MG tablet Take 1 tablet once a day as needed for anxiety   Magnesium Oxide 400  MG CAPS Take 1 capsule (400 mg total) by mouth daily.   Multiple Vitamin (MULTIVITAMIN WITH MINERALS) TABS tablet Take 1 tablet by mouth daily.   ondansetron (ZOFRAN-ODT) 4 MG disintegrating tablet Take 4 mg by mouth every 6 (six) hours as needed.   simvastatin (ZOCOR) 10 MG tablet Take 10 mg by mouth at bedtime.   SUMAtriptan (IMITREX) 50 MG tablet TAKE 2 TABLETS EVERY 2 HOURS AS NEEDED FOR MIGRANE. MAY REPEAT IN 2 HRS IF NEEDED - MAX 2 PER DAY   topiramate (TOPAMAX) 100 MG tablet TAKE 1 TABLET BY MOUTH TWICE A DAY    Allergies  Allergen Reactions   Neosporin [Bacitracin-Polymyxin B] Swelling and Rash   Calan [Verapamil] Other (See Comments)    Racing heart Dizzyness Headaches Body tremors   Coreg [Carvedilol] Hives and Itching   Definity [Perflutren Lipid Microsphere] Other (See Comments)    Headache   Morphine And Related Nausea And Vomiting   Norco [Hydrocodone-Acetaminophen] Itching and Nausea And Vomiting   Beta Adrenergic Blockers Hives, Itching, Nausea And Vomiting and Rash    Pt has reactions to: Atenolol Carvedilol Metoprolol Nebivolol    Bystolic [Nebivolol Hcl] Rash        Cardizem Cd [Diltiazem Hcl Er Beads] Rash   Tenormin [Atenolol] Itching and Rash   Toprol Xl [Metoprolol] Rash    Headaches Dizziness       Review of Systems negative except from HPI and PMH  Physical Exam BP 114/86   Pulse 81   Ht 5\' 3"  (1.6 m)   Wt 144 lb 12.8 oz (65.7 kg)   LMP 05/04/2011   SpO2 98%   BMI 25.65 kg/m  Well developed and nourished in no acute distress HENT normal Neck supple with JVP-  flat  Clear Regular rate and rhythm, no murmurs or gallops Abd-soft with active BS No Clubbing cyanosis edema Skin-warm and dry A & Oriented  Grossly normal sensory and motor function  ECG sinus at 81 Interval 17/08/41 Otherwise normal     CrCl cannot be calculated (Patient's most recent lab result is older than the maximum 21 days allowed.).   Assessment and   Plan Palpitations  Autonomic dysfunction    MR eccentric  mod  Beta-blocker allergies with hives  Cardiomyopathy-mild-resolved  Tachycardia remains much improved on clonidine.  Palpitations quiescient on flecainide we will continue both.  I have reviewed with her and with Dr. 19/08/41 for mitral regurgitation.  She has followed systolic MR that is eccentric.  I recommended TEE which we will arrange to be done with Dr. Laurance Flatten.  Cold induced dyspnea, question cold-induced asthma.  Will arrange for pulmonary consultation

## 2022-07-13 NOTE — H&P (View-Only) (Signed)
Patient Care Team: Pllc, [facility] as PCP - General Deboraha Sprang, MD as PCP - Electrophysiology (Cardiology) Harl Bowie Alphonse Guild, MD as Consulting Physician (Cardiology) Evans Lance, MD as Consulting Physician (Cardiology) Cameron Sprang, MD as Consulting Physician (Neurology) Derek Jack, MD as Medical Oncologist (Hematology)   HPI  Tara Mahoney is a 36 y.o. 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  Multiple events occurred with negative EEGs.  She was supine for most.  Her eyes were noted to be half open at the initial event.  No other work comments were made about that.  Moderate dyspnea on exertion particularly in the cold. Palpitations are relatively quiescient as is tachycardia tolerating, the flecainide and the      DATE TEST EF    8/17 cMRI 48 % ?  Noncompaction  9/17 Echo  50-55 %    3/18 Echo  55 %     3/23 Echo   75% MR-moderate-eccentric         Date Cr K Hgb  1/23 0.82 3.3 14.1   7/23 0.92 3.5 13.4        Records and Results Reviewed   Losing weight since the beginning of lost about 10 pounds which NOAC  Past Medical History:  Diagnosis Date   Atrial fibrillation ([place])    Blood transfusion without reported diagnosis    Complication of anesthesia     Dysautonomia ([place])    sinus arrythmia   Family history of anesthesia complication    PONV   [id number])    Heart murmur    MVP (mitral valve prolapse)    PONV (postoperative nausea and vomiting)    POTS (postural orthostatic tachycardia syndrome)    Stroke ([place]) 11/08/2021   Ventricular tachycardia, nonsustained [facility])     Past Surgical History:  Procedure Laterality Date   ABDOMINAL HYSTERECTOMY     CERVICAL CERCLAGE  2008   CERVICAL CERCLAGE  09/08/2011   Procedure: CERCLAGE CERVICAL;  Surgeon: Florian Buff, MD;  Location: [facility];  Service: Gynecology;  Laterality: N/A;  [name],CST-scrubbed; doctor scrubbed in @ [place]  08/09/2012   Procedure: LAPAROSCOPIC LYSIS OF ADHESIONS;  Surgeon: Florian Buff, MD;  Location: [facility];  Service: Gynecology;  Laterality: N/A;   LAPAROSCOPY  08/09/2012   Procedure: LAPAROSCOPY DIAGNOSTIC;  Surgeon: Florian Buff, MD;  Location: [facility];  Service: Gynecology;  Laterality: N/A;  started at  1208-Vaginal trachelectomy (vaginal removal of cervical stump)    MYRINGOTOMY     SUPRACERVICAL ABDOMINAL HYSTERECTOMY  01/26/2012   Procedure: HYSTERECTOMY SUPRACERVICAL ABDOMINAL;  Surgeon: Florian Buff, MD;  Location: [place] [facility];  Service: Gynecology;;  converted [phone]    Current Meds  Medication Sig   Ascorbic Acid (VITAMIN C PO)    aspirin 81 MG chewable  tablet Chew 1 tablet (81 mg total) by mouth daily.   cloNIDine (CATAPRES - DOSED IN MG/24 HR) 0.1 mg/24hr patch Place 1 patch (0.1 mg total) onto the skin once a week.   flecainide (TAMBOCOR) 50 MG tablet Take 1 tablet (50 mg total) by mouth 2 (two) times daily.   FLUoxetine (PROZAC) 40 MG capsule TAKE 1 CAPSULE (40 MG TOTAL) BY MOUTH DAILY.   gabapentin (NEURONTIN) 300 MG capsule Take by mouth.   loratadine (CLARITIN) 10 MG tablet Take 10 mg by mouth daily.   LORazepam (ATIVAN) 0.5 MG tablet Take 1 tablet once a day as needed for anxiety   Magnesium Oxide 400  MG CAPS Take 1 capsule (400 mg total) by mouth daily.   Multiple Vitamin (MULTIVITAMIN WITH MINERALS) TABS tablet Take 1 tablet by mouth daily.   ondansetron (ZOFRAN-ODT) 4 MG disintegrating tablet Take 4 mg by mouth every 6 (six) hours as needed.   simvastatin (ZOCOR) 10 MG tablet Take 10 mg by mouth at bedtime.   SUMAtriptan (IMITREX) 50 MG tablet TAKE 2 TABLETS EVERY 2 HOURS AS NEEDED FOR MIGRANE. MAY REPEAT IN 2 HRS IF NEEDED - MAX 2 PER DAY   topiramate (TOPAMAX) 100 MG tablet TAKE 1 TABLET BY MOUTH TWICE A DAY    Allergies  Allergen Reactions   Neosporin [Bacitracin-Polymyxin B] Swelling and Rash   Calan [Verapamil] Other (See Comments)    Racing heart Dizzyness Headaches Body tremors   Coreg [Carvedilol] Hives and Itching   Definity [Perflutren Lipid Microsphere] Other (See Comments)    Headache   Morphine And Related Nausea And Vomiting   Norco [Hydrocodone-Acetaminophen] Itching and Nausea And Vomiting   Beta Adrenergic Blockers Hives, Itching, Nausea And Vomiting and Rash    Pt has reactions to: Atenolol Carvedilol Metoprolol Nebivolol    Bystolic [Nebivolol Hcl] Rash        Cardizem Cd [Diltiazem Hcl Er Beads] Rash   Tenormin [Atenolol] Itching and Rash   Toprol Xl [Metoprolol] Rash    Headaches Dizziness       Review of Systems negative except from HPI and PMH  Physical Exam BP 114/86   Pulse 81   Ht 5\' 3"  (1.6 m)   Wt 144 lb 12.8 oz (65.7 kg)   LMP 05/04/2011   SpO2 98%   BMI 25.65 kg/m  Well developed and nourished in no acute distress HENT normal Neck supple with JVP-  flat  Clear Regular rate and rhythm, no murmurs or gallops Abd-soft with active BS No Clubbing cyanosis edema Skin-warm and dry A & Oriented  Grossly normal sensory and motor function  ECG sinus at 81 Interval 17/08/41 Otherwise normal     CrCl cannot be calculated (Patient's most recent lab result is older than the maximum 21 days allowed.).   Assessment and   Plan Palpitations  Autonomic dysfunction    MR eccentric  mod  Beta-blocker allergies with hives  Cardiomyopathy-mild-resolved  Tachycardia remains much improved on clonidine.  Palpitations quiescient on flecainide we will continue both.  I have reviewed with her and with Dr. 19/08/41 for mitral regurgitation.  She has followed systolic MR that is eccentric.  I recommended TEE which we will arrange to be done with Dr. Laurance Flatten.  Cold induced dyspnea, question cold-induced asthma.  Will arrange for pulmonary consultation

## 2022-07-13 NOTE — Patient Instructions (Addendum)
Medication Instructions:  The current medical regimen is effective;  continue present plan and medications.  *If you need a refill on your cardiac medications before your next appointment, please call your pharmacy*   You have been referred to [facility] and will be contacted to be scheduled.  Your physician has requested that you have a TEE. During a TEE, sound waves are used to create images of your heart. It provides your doctor with information about the size and shape of your heart and how well your heart's chambers and valves are working. In this test, a transducer is attached to the end of a flexible tube that's guided down your throat and into your esophagus (the tube leading from you mouth to your stomach) to get a more detailed image of your heart. You are not awake for the procedure. Please see the instruction sheet given to you today. For further information please visit [email or web address]    [facility] [address] [place], [place] [address] [id number] [facility] [place] [address] Dept: [phone] Loc: [phone]  Tara Mahoney  07/13/2022  You are scheduled for a TEE on Friday, December 29 with Dr.  Dr Laurance Flatten .  1. Please arrive at the [facility] (Main Entrance A) at [facility]: [address] [place], [place] [address] at 6:30 AM   Special note: Every effort is made to have your procedure done on time. Please understand that emergencies sometimes delay scheduled procedures.  2. Diet: Nothing to eat/drink after midnight.  3. Labs: None  4. Medication instructions in preparation for your procedure:    You may take your morning medications as listed.  6. Bring a current list of your medications and current insurance cards. 7. You MUST have a responsible person to drive you home. 8. Someone MUST be with you the first 24 hours  after you arrive home or your discharge will be delayed. 9. Please wear clothes that are easy to get on and off and wear slip-on shoes.  Thank you for allowing [place] to care for you!   -- [facility]   Follow-Up: At [facility], you and your health needs are our priority.  As part of our continuing mission to provide you with exceptional heart care, we have created designated Provider Care Teams.  These Care Teams include your primary Cardiologist (physician) and Advanced Practice Providers (APPs -  Physician Assistants and Nurse Practitioners) who all work together to provide you with the care you need, when you need it.  We recommend signing up for the patient portal called "MyChart".  Sign up information is provided on this After Visit Summary.  MyChart is used to connect with patients for Virtual Visits (Telemedicine).  Patients are able to view lab/test results, encounter notes, upcoming appointments, etc.  Non-urgent messages can be sent to your provider as well.   To learn more about what you can do with MyChart, go to [email or web address].    Your next appointment:   1 year(s)  The format for your next appointment:   In Person  Provider:   Dr Sherryl Manges      Important Information About Sugar

## 2022-07-15 ENCOUNTER — Other Ambulatory Visit: Payer: Self-pay | Admitting: Internal Medicine

## 2022-07-15 DIAGNOSIS — I340 Nonrheumatic mitral (valve) insufficiency: Secondary | ICD-10-CM

## 2022-07-15 NOTE — Anesthesia Preprocedure Evaluation (Addendum)
Anesthesia Evaluation  Patient identified by MRN, date of birth, ID band Patient awake    Reviewed: Allergy & Precautions, NPO status , Patient's Chart, lab work & pertinent test results  History of Anesthesia Complications (+) PONV and history of anesthetic complications  Airway Mallampati: II  TM Distance: >3 FB Neck ROM: Full    Dental  (+) Dental Advisory Given, Teeth Intact   Pulmonary former smoker   Pulmonary exam normal        Cardiovascular Normal cardiovascular exam+ dysrhythmias Atrial Fibrillation + Valvular Problems/Murmurs MR    '23 TTE - EF 53%. Moderate MR  POTS    Neuro/Psych  Headaches, Seizures -, Poorly Controlled,  CVA, Residual Symptoms  negative psych ROS   GI/Hepatic negative GI ROS, Neg liver ROS,,,  Endo/Other  negative endocrine ROS    Renal/GU negative Renal ROS     Musculoskeletal negative musculoskeletal ROS (+)    Abdominal   Peds  Hematology negative hematology ROS (+)   Anesthesia Other Findings   Reproductive/Obstetrics                             Anesthesia Physical Anesthesia Plan  ASA: 3  Anesthesia Plan: MAC   Post-op Pain Management: Minimal or no pain anticipated   Induction:   PONV Risk Score and Plan: 3 and Propofol infusion and Treatment may vary due to age or medical condition  Airway Management Planned: Nasal Cannula and Natural Airway  Additional Equipment: None  Intra-op Plan:   Post-operative Plan:   Informed Consent: I have reviewed the patients History and Physical, chart, labs and discussed the procedure including the risks, benefits and alternatives for the proposed anesthesia with the patient or authorized representative who has indicated his/her understanding and acceptance.       Plan Discussed with: CRNA and Anesthesiologist  Anesthesia Plan Comments:         Anesthesia Quick Evaluation

## 2022-07-16 ENCOUNTER — Encounter (HOSPITAL_COMMUNITY)
Admission: RE | Disposition: A | Discharge status: Home / Self Care | Payer: Self-pay | Source: Ambulatory Visit | Attending: Cardiology

## 2022-07-16 ENCOUNTER — Ambulatory Visit (HOSPITAL_COMMUNITY)
Admission: RE | Admit: 2022-07-16 | Discharge: 2022-07-16 | Disposition: A | Discharge status: Home / Self Care | Payer: BC Managed Care – PPO | Source: Ambulatory Visit | Attending: Cardiology | Admitting: Cardiology

## 2022-07-16 ENCOUNTER — Ambulatory Visit (HOSPITAL_COMMUNITY): Payer: BC Managed Care – PPO | Admitting: Anesthesiology

## 2022-07-16 ENCOUNTER — Other Ambulatory Visit: Payer: Self-pay

## 2022-07-16 ENCOUNTER — Encounter (HOSPITAL_COMMUNITY): Payer: Self-pay | Admitting: Cardiology

## 2022-07-16 ENCOUNTER — Ambulatory Visit (HOSPITAL_BASED_OUTPATIENT_CLINIC_OR_DEPARTMENT_OTHER)
Admission: RE | Admit: 2022-07-16 | Discharge: 2022-07-16 | Disposition: A | Discharge status: Home / Self Care | Payer: BC Managed Care – PPO | Source: Ambulatory Visit | Attending: Cardiology | Admitting: Cardiology

## 2022-07-16 DIAGNOSIS — I4891 Unspecified atrial fibrillation: Secondary | ICD-10-CM | POA: Diagnosis not present

## 2022-07-16 DIAGNOSIS — I340 Nonrheumatic mitral (valve) insufficiency: Secondary | ICD-10-CM | POA: Insufficient documentation

## 2022-07-16 DIAGNOSIS — F458 Other somatoform disorders: Secondary | ICD-10-CM | POA: Insufficient documentation

## 2022-07-16 DIAGNOSIS — Q2112 Patent foramen ovale: Secondary | ICD-10-CM | POA: Insufficient documentation

## 2022-07-16 DIAGNOSIS — Z01810 Encounter for preprocedural cardiovascular examination: Secondary | ICD-10-CM

## 2022-07-16 DIAGNOSIS — I472 Ventricular tachycardia, unspecified: Secondary | ICD-10-CM | POA: Diagnosis not present

## 2022-07-16 DIAGNOSIS — I493 Ventricular premature depolarization: Secondary | ICD-10-CM | POA: Diagnosis not present

## 2022-07-16 DIAGNOSIS — I059 Rheumatic mitral valve disease, unspecified: Secondary | ICD-10-CM

## 2022-07-16 HISTORY — PX: TEE WITHOUT CARDIOVERSION: SHX5443

## 2022-07-16 LAB — POCT I-STAT, CHEM 8
BUN: 16 mg/dL (ref 6–20)
Calcium, Ion: 1.17 mmol/L (ref 1.15–1.40)
Chloride: 109 mmol/L (ref 98–111)
Creatinine, Ser: 0.9 mg/dL (ref 0.44–1.00)
Glucose, Bld: 84 mg/dL (ref 70–99)
HCT: 37 % (ref 36.0–46.0)
Hemoglobin: 12.6 g/dL (ref 12.0–15.0)
Potassium: 3.9 mmol/L (ref 3.5–5.1)
Sodium: 139 mmol/L (ref 135–145)
TCO2: 21 mmol/L — ABNORMAL LOW (ref 22–32)

## 2022-07-16 LAB — ECHO TEE
MV M vel: 4.55 m/s
MV Peak grad: 82.6 mmHg
Radius: 0.6 cm

## 2022-07-16 SURGERY — ECHOCARDIOGRAM, TRANSESOPHAGEAL
Anesthesia: Monitor Anesthesia Care

## 2022-07-16 MED ORDER — SODIUM CHLORIDE BACTERIOSTATIC 0.9 % IJ SOLN
INTRAMUSCULAR | Status: DC | PRN
  Administered 2022-07-16 (×2): 9 mL via INTRAVENOUS

## 2022-07-16 MED ORDER — SODIUM CHLORIDE 0.9 % IV SOLN: INTRAVENOUS | Status: DC

## 2022-07-16 MED ORDER — LIDOCAINE 2% (20 MG/ML) 5 ML SYRINGE
INTRAMUSCULAR | Status: DC | PRN
  Administered 2022-07-16: 60 mg via INTRAVENOUS

## 2022-07-16 MED ORDER — LACTATED RINGERS IV SOLN: INTRAVENOUS | Status: DC

## 2022-07-16 MED ORDER — PROPOFOL 10 MG/ML IV BOLUS
INTRAVENOUS | Status: DC | PRN
  Administered 2022-07-16 (×2): 25 mg via INTRAVENOUS

## 2022-07-16 MED ORDER — PHENYLEPHRINE 80 MCG/ML (10ML) SYRINGE FOR IV PUSH (FOR BLOOD PRESSURE SUPPORT)
PREFILLED_SYRINGE | INTRAVENOUS | Status: DC | PRN
  Administered 2022-07-16 (×4): 80 ug via INTRAVENOUS

## 2022-07-16 MED ORDER — PROPOFOL 500 MG/50ML IV EMUL
INTRAVENOUS | Status: DC | PRN
  Administered 2022-07-16: 100 ug/kg/min via INTRAVENOUS

## 2022-07-16 NOTE — CV Procedure (Addendum)
     Transesophageal Echocardiogram Note  Darrelyn Morro [id number] 25-Nov-1985  Procedure: Transesophageal Echocardiogram Indications: Mitral regurgitation  Procedure Details Consent: Obtained Time Out: Verified patient identification, verified procedure, site/side was marked, verified correct patient position, special equipment/implants available, Radiology Safety Procedures followed,  medications/allergies/relevent history reviewed, required imaging and test results available.  Performed  Medications: Propofol 287mg   Left Ventrical:  LVEF 50% with global hypokinesis. No significant LV trabeculation visualized to suggest LV non-compaction.  Mitral Valve: The anterior leaflet is thickened with mildly restricted leaflet motion. The posterior leaflet is thickened and more diminutive in size. There is moderate, very eccentric, posteriorly directed mitral regurgitation with 3D EROA 0.3cm2.  Aortic Valve: Tricuspid, No AR  Tricuspid Valve: Normal structure. Trivial TR  Pulmonic Valve: Normal structure. No PI  Left Atrium/ Left atrial appendage:  No evidence of LAA thrombus   Atrial septum: A PFO is visualized with continuous left to right shunting. Multiple agitate bubble studies attempted but due to poor flow through the IV, they were sub-optimal studies  Aorta: Normal aortic root   Complications: No apparent complications Patient did tolerate procedure well.  , MD 07/16/2022, 8:41 AM

## 2022-07-16 NOTE — Discharge Instructions (Signed)
TEE  YOU HAD AN CARDIAC PROCEDURE TODAY: Refer to the procedure report and other information in the discharge instructions given to you for any specific questions about what was found during the examination. If this information does not answer your questions, please call [facility] office at [phone] to clarify.   DIET: Your first meal following the procedure should be a light meal and then it is ok to progress to your normal diet. A half-sandwich or bowl of soup is an example of a good first meal. Heavy or fried foods are harder to digest and may make you feel nauseous or bloated. Drink plenty of fluids but you should avoid alcoholic beverages for 24 hours. If you had a esophageal dilation, please see attached instructions for diet.   ACTIVITY: Your care partner should take you home directly after the procedure. You should plan to take it easy, moving slowly for the rest of the day. You can resume normal activity the day after the procedure however YOU SHOULD NOT DRIVE, use power tools, machinery or perform tasks that involve climbing or major physical exertion for 24 hours (because of the sedation medicines used during the test).   SYMPTOMS TO REPORT IMMEDIATELY: A cardiologist can be reached at any hour. Please call [phone] for any of the following symptoms:  Vomiting of blood or coffee ground material  New, significant abdominal pain  New, significant chest pain or pain under the shoulder blades  Painful or persistently difficult swallowing  New shortness of breath  Black, tarry-looking or red, bloody stools  FOLLOW UP:  Please also call with any specific questions about appointments or follow up tests. TEE  YOU HAD AN CARDIAC PROCEDURE TODAY: Refer to the procedure report and other information in the discharge instructions given to you for any specific questions about what was found during the examination. If this information does not answer your questions, please call [facility] office at [phone] to clarify.   DIET: Your first meal following the procedure should be a light meal and then it is ok to progress to your normal diet. A half-sandwich or bowl of soup is an example of a good first meal. Heavy or fried foods are harder to digest and may make you feel nauseous or bloated. Drink plenty of fluids but you should avoid alcoholic beverages for 24 hours. If you had a esophageal dilation, please see attached instructions for diet.   ACTIVITY: Your care partner should take you home directly after the procedure. You should plan to take it easy, moving slowly for the rest of the day. You can resume normal activity the day after the procedure however YOU SHOULD NOT DRIVE, use power tools, machinery or perform tasks that involve climbing or major physical exertion for 24 hours (because of the sedation medicines used during the test).   SYMPTOMS TO REPORT IMMEDIATELY: A cardiologist can be reached at any hour. Please call [phone] for any of the following symptoms:  Vomiting of blood or coffee ground material  New, significant abdominal pain  New, significant chest pain or pain under the shoulder blades  Painful or persistently difficult swallowing  New shortness of breath  Black, tarry-looking or red, bloody stools  FOLLOW UP:  Please also call with any specific questions about appointments or follow up tests.  

## 2022-07-16 NOTE — Anesthesia Postprocedure Evaluation (Signed)
Anesthesia Post Note  Patient: Tara Mahoney  Procedure(s) Performed: TRANSESOPHAGEAL ECHOCARDIOGRAM (TEE)     Patient location during evaluation: PACU Anesthesia Type: MAC Level of consciousness: awake and alert Pain management: pain level controlled Vital Signs Assessment: post-procedure vital signs reviewed and stable Respiratory status: spontaneous breathing, nonlabored ventilation and respiratory function stable Cardiovascular status: stable and blood pressure returned to baseline Anesthetic complications: no   No notable events documented.  Last Vitals:  Vitals:   07/16/22 0910 07/16/22 0920  BP: 113/64 110/69  Pulse: 72 69  Resp: 14 15  Temp:    SpO2: 100% 100%    Last Pain:  Vitals:   07/16/22 0920  TempSrc:   PainSc: [place]

## 2022-07-16 NOTE — Interval H&P Note (Signed)
History and Physical Interval Note:  07/16/2022 7:41 AM  Tara Mahoney  has presented today for surgery, with the diagnosis of mitral regurgitation.  The various methods of treatment have been discussed with the patient and family. After consideration of risks, benefits and other options for treatment, the patient has consented to  Procedure(s): TRANSESOPHAGEAL ECHOCARDIOGRAM (TEE) (N/A) as a surgical intervention.  The patient's history has been reviewed, patient examined, no change in status, stable for surgery.  I have reviewed the patient's chart and labs.  Questions were answered to the patient's satisfaction.     Meriam Sprague

## 2022-07-16 NOTE — Anesthesia Procedure Notes (Signed)
Procedure Name: MAC Date/Time: 07/16/2022 7:57 AM  Performed by: Colin Benton, CRNAPre-anesthesia Checklist: Patient identified, Suction available, Patient being monitored and Emergency Drugs available Patient Re-evaluated:Patient Re-evaluated prior to induction Oxygen Delivery Method: Nasal cannula Induction Type: IV induction Airway Equipment and Method: Bite block Placement Confirmation: positive ETCO2 Dental Injury: Teeth and Oropharynx as per pre-operative assessment

## 2022-07-16 NOTE — Transfer of Care (Signed)
Immediate Anesthesia Transfer of Care Note  Patient: Tara Mahoney  Procedure(s) Performed: TRANSESOPHAGEAL ECHOCARDIOGRAM (TEE)  Patient Location: Endoscopy Unit  Anesthesia Type:MAC  Level of Consciousness: drowsy  Airway & Oxygen Therapy: Patient Spontanous Breathing and Patient connected to nasal cannula oxygen  Post-op Assessment: Report given to RN and Post -op Vital signs reviewed and stable  Post vital signs: Reviewed and stable  Last Vitals:  Vitals Value Taken Time  BP 92/58 07/16/22 0851  Temp    Pulse 62 07/16/22 0851  Resp 18 07/16/22 0851  SpO2 100 07/16/22 0851    Last Pain:  Vitals:   07/16/22 0651  TempSrc: Temporal  PainSc: 0-No pain      Patients Stated Pain Goal: 2 (55/20/80 2233)  Complications: No notable events documented.

## 2022-07-16 NOTE — Progress Notes (Signed)
  Echocardiogram Echocardiogram Transesophageal has been performed.  Janalyn Harder 07/16/2022, 9:11 AM

## 2022-07-18 ENCOUNTER — Encounter (HOSPITAL_COMMUNITY): Payer: Self-pay | Admitting: Cardiology

## 2022-08-10 ENCOUNTER — Other Ambulatory Visit: Payer: Self-pay | Admitting: Neurology

## 2022-08-10 ENCOUNTER — Other Ambulatory Visit: Payer: Self-pay | Admitting: Family Medicine

## 2022-08-11 NOTE — Telephone Encounter (Signed)
Rx refill request approved per Dr. Corey's orders. 

## 2022-08-13 ENCOUNTER — Telehealth: Payer: Self-pay

## 2022-08-13 NOTE — Telephone Encounter (Signed)
Mychart msg sent. AS, CMA 

## 2022-08-20 ENCOUNTER — Inpatient Hospital Stay: Payer: BC Managed Care – PPO | Attending: Hematology

## 2022-08-20 DIAGNOSIS — D696 Thrombocytopenia, unspecified: Secondary | ICD-10-CM | POA: Insufficient documentation

## 2022-08-24 ENCOUNTER — Inpatient Hospital Stay: Payer: BC Managed Care – PPO

## 2022-08-24 DIAGNOSIS — D696 Thrombocytopenia, unspecified: Secondary | ICD-10-CM

## 2022-08-24 LAB — CBC WITH DIFFERENTIAL/PLATELET
Abs Immature Granulocytes: 0.01 10*3/uL (ref 0.00–0.07)
Basophils Absolute: 0.1 10*3/uL (ref 0.0–0.1)
Basophils Relative: 1 %
Eosinophils Absolute: 0.7 10*3/uL — ABNORMAL HIGH (ref 0.0–0.5)
Eosinophils Relative: 12 %
HCT: 37.8 % (ref 36.0–46.0)
Hemoglobin: 12.8 g/dL (ref 12.0–15.0)
Immature Granulocytes: 0 %
Lymphocytes Relative: 33 %
Lymphs Abs: 2 10*3/uL (ref 0.7–4.0)
MCH: 31.4 pg (ref 26.0–34.0)
MCHC: 33.9 g/dL (ref 30.0–36.0)
MCV: 92.9 fL (ref 80.0–100.0)
Monocytes Absolute: 0.4 10*3/uL (ref 0.1–1.0)
Monocytes Relative: 7 %
Neutro Abs: 2.8 10*3/uL (ref 1.7–7.7)
Neutrophils Relative %: 47 %
Platelets: 186 10*3/uL (ref 150–400)
RBC: 4.07 MIL/uL (ref 3.87–5.11)
RDW: 12.8 % (ref 11.5–15.5)
WBC: 6 10*3/uL (ref 4.0–10.5)
nRBC: 0 % (ref 0.0–0.2)

## 2022-08-25 ENCOUNTER — Encounter (INDEPENDENT_AMBULATORY_CARE_PROVIDER_SITE_OTHER): Payer: Self-pay

## 2022-08-26 ENCOUNTER — Other Ambulatory Visit: Payer: Self-pay | Admitting: Neurology

## 2022-08-26 NOTE — Progress Notes (Signed)
VIRTUAL VISIT via [place]   I connected with Tara Mahoney  on 08/27/22 at  1:30 PM  by telephone and verified that I am speaking with the correct person using two identifiers.  Location: Patient: Home Provider: [facility]   I discussed the limitations, risks, security and privacy concerns of performing an evaluation and management service by telephone and the availability of in person appointments. I also discussed with the patient that there may be a patient responsible charge related to this service. The patient expressed understanding and agreed to proceed.  REASON FOR VISIT: Thrombocytopenia  PRIOR THERAPY: None  CURRENT THERAPY: Surveillance  INTERVAL HISTORY:  Tara Mahoney is contacted today for follow-up of thrombocytopenia.  She was last seen by Tarri Abernethy PA-C on 02/15/2022.  At today's visit, she reports feeling fairly well.  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   She reports she does not get menses as she had a partial hysterectomy due to heavy bleeding immediately following the birth of her last child.  She reports intermittent spontaneous epistaxis lasting < 10 minutes.  She denies any hematuria or gum bleeding; no hematochezia or melena.  She reports easy bruising of her extremities, but denies any petechial rash or spontaneous purpura.  She has some night sweats.  She denies skin rashes and joint pains. She denies spontaneous bleeding or bruising.  She denies any fevers, chills, or unintentional weight loss.  She has 25% energy and 70% appetite. She endorses that she is maintaining a stable weight.   REVIEW OF SYSTEMS:   Review of Systems  Constitutional:  Positive for malaise/fatigue. Negative for chills, diaphoresis, fever and weight loss.  Respiratory:  Positive for cough. Negative for shortness of breath.   Cardiovascular:  Positive for chest  pain (at times). Negative for palpitations.  Gastrointestinal:  Negative for abdominal pain, blood in stool, melena, nausea and vomiting.  Neurological:  Positive for tingling and headaches. Negative for dizziness.  Psychiatric/Behavioral:  The patient has insomnia.      PHYSICAL EXAM: (per limitations of virtual telephone visit)  The patient is alert and oriented x 3, exhibiting adequate mentation, good mood, and ability to speak in full sentences and execute sound judgement.  ASSESSMENT & PLAN:  1.  Intermittent thrombocytopenia, mild - She has had mild to moderate thrombocytopenia since at least 2008, with platelets ranging from 82 to normal. - Lowest platelets (82 on 01/27/2012) occurred during hospitalization for C-section due to placenta previa, where she was hospitalized from 01/25/2012 through 01/27/2012.  For the most part, platelets have been >120. 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  She takes Imitrex and Topamax for migraines.  She is awaiting neurology consultation in the outpatient setting. - She had blood transfusion in 2013 after C-section birth of her daughter. - No history of connective tissue disorders or autoimmune disease.  She denies any skin rashes or joint pains.   - She reports that she "loves" walnuts, and eats them a few times each week.  She does not take any quinine containing supplements.   - Hematology work-up (01/22/2022): Positive for H. pylori IgG antibodies Negative hepatitis C and hepatitis B. Negative ANA, RF.  SPEP negative. Nutritional panel unremarkable.  Normal copper, methylmalonic acid.  B12 and folate normal when checked on 12/31/2021. Negative beta-2 glycoprotein 1 antibodies, negative lupus anticoagulant panel.  Weakly positive anticardiolipin antibody IgM at 15 is clinically insignificant. - Occasional epistaxis.  No spontaneous bruising or bleeding. She reports night sweats.  No fevers,  chills, or unintentional weight loss.  - Most recent CBC (08/24/2022): Normal platelets 186 - PLAN: Differential diagnosis includes mild ITP (possibly related to H. pylori antibody) versus pseudothrombocytopenia from platelet clumping.  No indication for treatment at this time. - Patient is aware of alarm symptoms that would prompt immediate medical attention. - Labs only (CBC/D) in 6 months - Labs (CBC/D, CMP, LDH) + office visit in 1 year   2.  Other history - She has been having seizures since April 2023, occurring nearly daily and presenting as full body tremors times several minutes followed by syncope and at times Todd's paralysis.  She also has complex migraines.  Taking Topamax and Imitrex for migraines following head injury in 2022. - She is married and seen with her husband today.   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  No chemicals or pesticides.  Not tobacco use. - Mother had uterine/ovarian cancer.  Paternal aunt had breast cancer.  PLAN SUMMARY: >> Labs only (CBC/D) in 6 months >> Labs (CBC/D, CMP, LDH) + OFFICE visit in 1 year     I discussed the assessment and treatment plan with the patient. The patient was provided an opportunity to ask questions and all were answered. The patient agreed with the plan and demonstrated an understanding of the instructions.   The patient was advised to call back or seek an in-person evaluation if the symptoms worsen or if the condition fails to improve as anticipated.  I provided 22 minutes of non-face-to-face time during this encounter.  Harriett Rush, PA-C 08/27/22 2:01 PM

## 2022-08-27 ENCOUNTER — Inpatient Hospital Stay (HOSPITAL_BASED_OUTPATIENT_CLINIC_OR_DEPARTMENT_OTHER): Payer: BC Managed Care – PPO | Admitting: Physician Assistant

## 2022-08-27 DIAGNOSIS — D696 Thrombocytopenia, unspecified: Secondary | ICD-10-CM

## 2022-08-30 ENCOUNTER — Other Ambulatory Visit: Payer: Self-pay

## 2022-08-30 DIAGNOSIS — D696 Thrombocytopenia, unspecified: Secondary | ICD-10-CM

## 2022-08-31 ENCOUNTER — Encounter: Payer: Self-pay | Admitting: Internal Medicine

## 2022-08-31 ENCOUNTER — Ambulatory Visit (INDEPENDENT_AMBULATORY_CARE_PROVIDER_SITE_OTHER): Payer: BC Managed Care – PPO | Admitting: Internal Medicine

## 2022-08-31 ENCOUNTER — Ambulatory Visit (HOSPITAL_COMMUNITY)
Admission: RE | Admit: 2022-08-31 | Discharge: 2022-08-31 | Disposition: A | Discharge status: Home / Self Care | Payer: BC Managed Care – PPO | Source: Ambulatory Visit | Attending: Internal Medicine | Admitting: Internal Medicine

## 2022-08-31 VITALS — BP 112/62 | HR 84 | Ht 63.0 in | Wt 154.6 lb

## 2022-08-31 DIAGNOSIS — R0609 Other forms of dyspnea: Secondary | ICD-10-CM | POA: Diagnosis not present

## 2022-08-31 MED ORDER — MONTELUKAST SODIUM 10 MG PO TABS: 10.0000 mg | ORAL_TABLET | Freq: Every day | ORAL | 11 refills | Status: AC

## 2022-08-31 NOTE — Assessment & Plan Note (Signed)
Onset at variable levels of exertion p covid 02/2020  - Eos  08/24/22  = 0.7 > start singulair 10 mg daily and referred to Allergy as very sensitive to saba and not clear this is really asthma or allergy

## 2022-08-31 NOTE — Progress Notes (Unsigned)
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   Sleeping  without nocturnal  or early am exacerbation  of respiratory  c/o's or need for noct saba. Also denies any obvious fluctuation of symptoms with weather or environmental changes or other aggravating or alleviating factors except as outlined above   No unusual exposure hx or h/o childhood pna/ asthma or knowledge of premature birth.  Current Allergies, Complete Past Medical History, Past Surgical History, Family History, and Social History were reviewed in [facility] record.  ROS  The following are not active complaints unless bolded Hoarseness, sore throat, dysphagia, dental problems, itching, sneezing,  nasal congestion or discharge of excess mucus or purulent secretions, ear ache,   fever, chills, sweats, unintended wt loss or wt gain, classically pleuritic or  exertional cp,  orthopnea pnd or arm/hand swelling  or leg swelling, presyncope, palpitations, abdominal pain, anorexia, nausea, vomiting, diarrhea  or change in bowel habits or change in bladder habits, change in stools or change in urine, dysuria, hematuria,  rash, arthralgias, visual complaints, headache, numbness, weakness or ataxia or problems with walking or coordination,  change in mood or  memory.             Past Medical History:  Diagnosis Date   Atrial fibrillation ([place])    Blood transfusion without reported diagnosis    Complication of anesthesia    Dysautonomia ([place])    sinus arrythmia   Family history of anesthesia complication    PONV   [id number])    Heart murmur    MVP (mitral valve prolapse)    PONV (postoperative nausea and vomiting)    POTS (postural orthostatic tachycardia syndrome)    Stroke ([place]) 11/08/2021   Ventricular tachycardia, nonsustained ([facility])     Outpatient Medications Prior to Visit  Medication Sig Dispense Refill   Ascorbic Acid (VITAMIN C PO) Take 2,000 mg by mouth daily.     aspirin 81 MG chewable tablet Chew 1 tablet (81 mg total) by mouth daily. (Patient taking differently: Chew 81 mg by mouth at bedtime.)     cloNIDine (CATAPRES - DOSED IN MG/24 HR) 0.1 mg/24hr patch Place 1 patch (0.1 mg total) onto the skin once a week. 4 patch 12  flecainide (TAMBOCOR) 50 MG tablet Take 1 tablet (50 mg total) by mouth 2 (two) times daily. 180 tablet 3   FLUoxetine (PROZAC) 40 MG capsule TAKE 1 CAPSULE (40 MG TOTAL) BY MOUTH DAILY. 30 capsule 0   gabapentin (NEURONTIN) 600 MG tablet Take 600 mg by mouth 3 (three) times daily.     lamoTRIgine (LAMICTAL) 25 MG tablet PLEASE SEE ATTACHED FOR DETAILED DIRECTIONS     loratadine (CLARITIN) 10 MG tablet Take 10 mg by mouth at bedtime.     LORazepam (ATIVAN) 0.5 MG tablet Take 1 tablet once a day as needed for anxiety 10 tablet 5   Magnesium Oxide 400 MG CAPS Take 1 capsule (400 mg total) by mouth daily. 90  capsule 0   Multiple Vitamin (MULTIVITAMIN WITH MINERALS) TABS tablet Take 1 tablet by mouth every morning.     ondansetron (ZOFRAN-ODT) 4 MG disintegrating tablet Take 4 mg by mouth every 6 (six) hours as needed for nausea or vomiting.     OVER THE COUNTER MEDICATION Take 1 capsule by mouth daily. Salt stick electrolyte chews     simvastatin (ZOCOR) 10 MG tablet Take 10 mg by mouth at bedtime.     SUMAtriptan (IMITREX) 50 MG tablet TAKE 2 TABLETS EVERY 2 HOURS AS NEEDED FOR MIGRANE. MAY REPEAT IN 2 HRS IF NEEDED - MAX 2 PER DAY (Patient taking differently: Take 50-100 mg by mouth every 2 (two) hours as needed for migraine.) 9 tablet 8   topiramate (TOPAMAX) 100 MG tablet TAKE 1 TABLET BY MOUTH TWICE A DAY 180 tablet 1   No facility-administered medications prior to visit.     Objective:     BP 112/62   Pulse 84   Ht 5' 3"$  (1.6 m)   Wt 154 lb 9.6 oz (70.1 kg)   LMP 05/04/2011   SpO2 98% Comment: ra  BMI 27.39 kg/m   SpO2: 98 % (ra)  Amb wf nad    HEENT : Oropharynx  clear          NECK :  without  apparent JVD/ palpable Nodes/TM    LUNGS: no acc muscle use,  Nl contour chest which is clear to A and P bilaterally without cough on insp or exp maneuvers   CV:  RRR  no s3 2/6 HSM  s  increase in P2, and no edema   ABD:  soft and nontender with nl inspiratory excursion in the supine position.    MS:  Nl gait/ ext warm without deformities Or obvious joint restrictions  calf tenderness, cyanosis or clubbing    SKIN: warm and dry without lesions    NEURO:  alert, approp, nl sensorium with  no motor or cerebellar deficits apparent.         Assessment   No problem-specific Assessment & Plan notes found for this encounter.     Christinia Gully, MD 08/31/2022

## 2022-08-31 NOTE — Patient Instructions (Addendum)
Singulair (montelukast) 10 mg one each pm \  Wear scarf when out in cold weather  My office will be contacting you by phone for referral to [facility]   - if you don't hear back from my office within one week please call us back or notify us thru MyChart and we'll address it right away.    Please remember to go to the  x-ray department  @  [facility] for your tests - we will call you with the results when they are available

## 2022-09-17 ENCOUNTER — Other Ambulatory Visit: Payer: Self-pay | Admitting: Neurology

## 2022-11-09 ENCOUNTER — Inpatient Hospital Stay (HOSPITAL_COMMUNITY): Payer: BC Managed Care – PPO

## 2022-11-09 ENCOUNTER — Emergency Department (HOSPITAL_COMMUNITY): Payer: BC Managed Care – PPO

## 2022-11-09 ENCOUNTER — Encounter (HOSPITAL_COMMUNITY): Payer: Self-pay | Admitting: Emergency Medicine

## 2022-11-09 ENCOUNTER — Other Ambulatory Visit: Payer: Self-pay

## 2022-11-09 ENCOUNTER — Inpatient Hospital Stay (HOSPITAL_COMMUNITY)
Admission: EM | Admit: 2022-11-09 | Discharge: 2022-11-11 | DRG: 880 | Disposition: A | Discharge status: Other Acute Inpatient Hospital | Payer: BC Managed Care – PPO | Attending: Student | Admitting: Student

## 2022-11-09 ENCOUNTER — Inpatient Hospital Stay (HOSPITAL_COMMUNITY)
Admit: 2022-11-09 | Discharge: 2022-11-09 | Disposition: A | Discharge status: Home / Self Care | Payer: BC Managed Care – PPO | Attending: Emergency Medicine | Admitting: Emergency Medicine

## 2022-11-09 DIAGNOSIS — Z8041 Family history of malignant neoplasm of ovary: Secondary | ICD-10-CM | POA: Diagnosis not present

## 2022-11-09 DIAGNOSIS — I472 Ventricular tachycardia, unspecified: Secondary | ICD-10-CM | POA: Diagnosis present

## 2022-11-09 DIAGNOSIS — Z1152 Encounter for screening for COVID-19: Secondary | ICD-10-CM

## 2022-11-09 DIAGNOSIS — I4891 Unspecified atrial fibrillation: Secondary | ICD-10-CM | POA: Diagnosis present

## 2022-11-09 DIAGNOSIS — R000 Tachycardia, unspecified: Secondary | ICD-10-CM | POA: Diagnosis not present

## 2022-11-09 DIAGNOSIS — Z79899 Other long term (current) drug therapy: Secondary | ICD-10-CM

## 2022-11-09 DIAGNOSIS — Z8673 Personal history of transient ischemic attack (TIA), and cerebral infarction without residual deficits: Secondary | ICD-10-CM | POA: Diagnosis not present

## 2022-11-09 DIAGNOSIS — Z8249 Family history of ischemic heart disease and other diseases of the circulatory system: Secondary | ICD-10-CM | POA: Diagnosis not present

## 2022-11-09 DIAGNOSIS — Z888 Allergy status to other drugs, medicaments and biological substances status: Secondary | ICD-10-CM

## 2022-11-09 DIAGNOSIS — I341 Nonrheumatic mitral (valve) prolapse: Secondary | ICD-10-CM | POA: Diagnosis present

## 2022-11-09 DIAGNOSIS — Z7982 Long term (current) use of aspirin: Secondary | ICD-10-CM

## 2022-11-09 DIAGNOSIS — G901 Familial dysautonomia [Riley-Day]: Secondary | ICD-10-CM | POA: Diagnosis present

## 2022-11-09 DIAGNOSIS — G90A Postural orthostatic tachycardia syndrome (POTS): Secondary | ICD-10-CM | POA: Diagnosis present

## 2022-11-09 DIAGNOSIS — F445 Conversion disorder with seizures or convulsions: Secondary | ICD-10-CM | POA: Diagnosis not present

## 2022-11-09 DIAGNOSIS — R569 Unspecified convulsions: Secondary | ICD-10-CM | POA: Diagnosis not present

## 2022-11-09 DIAGNOSIS — G909 Disorder of the autonomic nervous system, unspecified: Secondary | ICD-10-CM | POA: Diagnosis not present

## 2022-11-09 DIAGNOSIS — G43909 Migraine, unspecified, not intractable, without status migrainosus: Secondary | ICD-10-CM | POA: Diagnosis present

## 2022-11-09 DIAGNOSIS — Z87891 Personal history of nicotine dependence: Secondary | ICD-10-CM

## 2022-11-09 DIAGNOSIS — I4729 Other ventricular tachycardia: Secondary | ICD-10-CM | POA: Diagnosis not present

## 2022-11-09 DIAGNOSIS — Z885 Allergy status to narcotic agent status: Secondary | ICD-10-CM

## 2022-11-09 LAB — RESP PANEL BY RT-PCR (RSV, FLU A&B, COVID)  RVPGX2
Influenza A by PCR: NEGATIVE
Influenza B by PCR: NEGATIVE
Resp Syncytial Virus by PCR: NEGATIVE
SARS Coronavirus 2 by RT PCR: NEGATIVE

## 2022-11-09 LAB — RAPID URINE DRUG SCREEN, HOSP PERFORMED
Amphetamines: NOT DETECTED
Barbiturates: NOT DETECTED
Benzodiazepines: POSITIVE — AB
Cocaine: NOT DETECTED
Opiates: NOT DETECTED
Tetrahydrocannabinol: NOT DETECTED

## 2022-11-09 LAB — COMPREHENSIVE METABOLIC PANEL
ALT: 12 U/L (ref 0–44)
AST: 15 U/L (ref 15–41)
Albumin: 3.9 g/dL (ref 3.5–5.0)
Alkaline Phosphatase: 33 U/L — ABNORMAL LOW (ref 38–126)
Anion gap: 8 (ref 5–15)
BUN: 16 mg/dL (ref 6–20)
CO2: 20 mmol/L — ABNORMAL LOW (ref 22–32)
Calcium: 9 mg/dL (ref 8.9–10.3)
Chloride: 108 mmol/L (ref 98–111)
Creatinine, Ser: 1.06 mg/dL — ABNORMAL HIGH (ref 0.44–1.00)
GFR, Estimated: >60 mL/min (ref >60)
Glucose, Bld: 101 mg/dL — ABNORMAL HIGH (ref 70–99)
Potassium: 3.8 mmol/L (ref 3.5–5.1)
Sodium: 136 mmol/L (ref 135–145)
Total Bilirubin: 0.5 mg/dL (ref 0.3–1.2)
Total Protein: 6.6 g/dL (ref 6.5–8.1)

## 2022-11-09 LAB — URINALYSIS, W/ REFLEX TO CULTURE (INFECTION SUSPECTED)
Bacteria, UA: NONE SEEN
Bilirubin Urine: NEGATIVE
Glucose, UA: NEGATIVE mg/dL
Hgb urine dipstick: NEGATIVE
Ketones, ur: NEGATIVE mg/dL
Leukocytes,Ua: NEGATIVE
Nitrite: NEGATIVE
Protein, ur: NEGATIVE mg/dL
Specific Gravity, Urine: 1.009 (ref 1.005–1.030)
pH: 7 (ref 5.0–8.0)

## 2022-11-09 LAB — CBC WITH DIFFERENTIAL/PLATELET
Abs Immature Granulocytes: 0.02 10*3/uL (ref 0.00–0.07)
Basophils Absolute: 0 10*3/uL (ref 0.0–0.1)
Basophils Relative: 1 %
Eosinophils Absolute: 0.4 10*3/uL (ref 0.0–0.5)
Eosinophils Relative: 7 %
HCT: 38.4 % (ref 36.0–46.0)
Hemoglobin: 13.1 g/dL (ref 12.0–15.0)
Immature Granulocytes: 0 %
Lymphocytes Relative: 16 %
Lymphs Abs: 0.8 10*3/uL (ref 0.7–4.0)
MCH: 31.7 pg (ref 26.0–34.0)
MCHC: 34.1 g/dL (ref 30.0–36.0)
MCV: 93 fL (ref 80.0–100.0)
Monocytes Absolute: 0.4 10*3/uL (ref 0.1–1.0)
Monocytes Relative: 7 %
Neutro Abs: 3.6 10*3/uL (ref 1.7–7.7)
Neutrophils Relative %: 69 %
Platelets: 162 10*3/uL (ref 150–400)
RBC: 4.13 MIL/uL (ref 3.87–5.11)
RDW: 12.6 % (ref 11.5–15.5)
WBC: 5.3 10*3/uL (ref 4.0–10.5)
nRBC: 0 % (ref 0.0–0.2)

## 2022-11-09 LAB — LACTIC ACID, PLASMA
Lactic Acid, Venous: 1.5 mmol/L (ref 0.5–1.9)
Lactic Acid, Venous: 1.2 mmol/L (ref 0.5–1.9)

## 2022-11-09 LAB — CK: Total CK: 64 U/L (ref 38–234)

## 2022-11-09 LAB — ACETAMINOPHEN LEVEL: Acetaminophen (Tylenol), Serum: <10 ug/mL — ABNORMAL LOW (ref 10–30)

## 2022-11-09 LAB — MAGNESIUM: Magnesium: 1.9 mg/dL (ref 1.7–2.4)

## 2022-11-09 LAB — SALICYLATE LEVEL: Salicylate Lvl: <7 mg/dL — ABNORMAL LOW (ref 7.0–30.0)

## 2022-11-09 LAB — ETHANOL: Alcohol, Ethyl (B): <10 mg/dL (ref <10)

## 2022-11-09 LAB — CBG MONITORING, ED: Glucose-Capillary: 103 mg/dL — ABNORMAL HIGH (ref 70–99)

## 2022-11-09 MED ORDER — FLECAINIDE ACETATE 50 MG PO TABS
50.0000 mg | ORAL_TABLET | Freq: Two times a day (BID) | ORAL | Status: DC
  Administered 2022-11-09 – 2022-11-10 (×3): 50 mg via ORAL
  Filled 2022-11-09 (×4): qty 1

## 2022-11-09 MED ORDER — LACTATED RINGERS IV BOLUS (SEPSIS)
1000.0000 mL | Freq: Once | INTRAVENOUS | Status: AC
  Administered 2022-11-09: 1000 mL via INTRAVENOUS

## 2022-11-09 MED ORDER — ASPIRIN 81 MG PO CHEW
81.0000 mg | CHEWABLE_TABLET | Freq: Every day | ORAL | Status: DC
  Administered 2022-11-10: 81 mg via ORAL
  Filled 2022-11-09 (×2): qty 1

## 2022-11-09 MED ORDER — LORATADINE 10 MG PO TABS
10.0000 mg | ORAL_TABLET | Freq: Every day | ORAL | Status: DC
  Administered 2022-11-09 – 2022-11-10 (×2): 10 mg via ORAL
  Filled 2022-11-09 (×2): qty 1

## 2022-11-09 MED ORDER — CLONIDINE HCL 0.1 MG/24HR TD PTWK: 0.1000 mg | MEDICATED_PATCH | TRANSDERMAL | Status: DC

## 2022-11-09 MED ORDER — LORAZEPAM 0.5 MG PO TABS: 0.5000 mg | ORAL_TABLET | Freq: Four times a day (QID) | ORAL | Status: DC | PRN

## 2022-11-09 MED ORDER — ONDANSETRON HCL 4 MG/2ML IJ SOLN: 4.0000 mg | Freq: Four times a day (QID) | INTRAMUSCULAR | Status: DC | PRN

## 2022-11-09 MED ORDER — MAGNESIUM OXIDE -MG SUPPLEMENT 400 (240 MG) MG PO TABS
400.0000 mg | ORAL_TABLET | Freq: Every day | ORAL | Status: DC
  Administered 2022-11-10: 400 mg via ORAL
  Filled 2022-11-09: qty 1

## 2022-11-09 MED ORDER — LAMOTRIGINE 25 MG PO TABS
150.0000 mg | ORAL_TABLET | Freq: Two times a day (BID) | ORAL | Status: DC
  Administered 2022-11-09 – 2022-11-10 (×3): 150 mg via ORAL
  Filled 2022-11-09 (×4): qty 2

## 2022-11-09 MED ORDER — MONTELUKAST SODIUM 10 MG PO TABS
10.0000 mg | ORAL_TABLET | Freq: Every day | ORAL | Status: DC
  Administered 2022-11-09 – 2022-11-10 (×2): 10 mg via ORAL
  Filled 2022-11-09 (×2): qty 1

## 2022-11-09 MED ORDER — FLUTICASONE PROPIONATE 50 MCG/ACT NA SUSP: 2.0000 | Freq: Every day | NASAL | Status: DC | PRN

## 2022-11-09 MED ORDER — HEPARIN SODIUM (PORCINE) 5000 UNIT/ML IJ SOLN
5000.0000 [IU] | Freq: Three times a day (TID) | INTRAMUSCULAR | Status: DC
  Administered 2022-11-09 – 2022-11-10 (×5): 5000 [IU] via SUBCUTANEOUS
  Filled 2022-11-09 (×5): qty 1

## 2022-11-09 MED ORDER — GABAPENTIN 300 MG PO CAPS
600.0000 mg | ORAL_CAPSULE | Freq: Two times a day (BID) | ORAL | Status: DC
  Administered 2022-11-09 – 2022-11-10 (×3): 600 mg via ORAL
  Filled 2022-11-09 (×3): qty 2

## 2022-11-09 MED ORDER — ONDANSETRON HCL 4 MG PO TABS: 4.0000 mg | ORAL_TABLET | Freq: Four times a day (QID) | ORAL | Status: DC | PRN

## 2022-11-09 MED ORDER — LORAZEPAM 2 MG/ML IJ SOLN
1.0000 mg | Freq: Once | INTRAMUSCULAR | Status: AC
  Administered 2022-11-09: 1 mg via INTRAVENOUS
  Filled 2022-11-09: qty 1

## 2022-11-09 MED ORDER — FLECAINIDE ACETATE 50 MG PO TABS: 50.0000 mg | ORAL_TABLET | Freq: Two times a day (BID) | ORAL | Status: DC

## 2022-11-09 MED ORDER — SODIUM CHLORIDE 0.9 % IV SOLN
60.0000 mg/kg | Freq: Once | INTRAVENOUS | Status: AC
  Administered 2022-11-09: 4210 mg via INTRAVENOUS
  Filled 2022-11-09: qty 42.1

## 2022-11-09 MED ORDER — FLUOXETINE HCL 20 MG PO CAPS
40.0000 mg | ORAL_CAPSULE | Freq: Every day | ORAL | Status: DC
  Administered 2022-11-10: 40 mg via ORAL
  Filled 2022-11-09: qty 2

## 2022-11-09 MED ORDER — SIMVASTATIN 20 MG PO TABS
10.0000 mg | ORAL_TABLET | Freq: Every day | ORAL | Status: DC
  Administered 2022-11-09 – 2022-11-10 (×2): 10 mg via ORAL
  Filled 2022-11-09 (×2): qty 1

## 2022-11-09 MED ORDER — ACETAMINOPHEN 650 MG RE SUPP: 650.0000 mg | Freq: Four times a day (QID) | RECTAL | Status: DC | PRN

## 2022-11-09 MED ORDER — ADULT MULTIVITAMIN W/MINERALS CH
1.0000 | ORAL_TABLET | ORAL | Status: DC
  Administered 2022-11-10: 1 via ORAL
  Filled 2022-11-09: qty 1

## 2022-11-09 MED ORDER — ACETAMINOPHEN 325 MG PO TABS: 650.0000 mg | ORAL_TABLET | Freq: Four times a day (QID) | ORAL | Status: DC | PRN

## 2022-11-09 MED ORDER — TOPIRAMATE 100 MG PO TABS
100.0000 mg | ORAL_TABLET | Freq: Two times a day (BID) | ORAL | Status: DC
  Administered 2022-11-09 – 2022-11-10 (×3): 100 mg via ORAL
  Filled 2022-11-09 (×3): qty 1

## 2022-11-09 MED ORDER — TOPIRAMATE 25 MG PO TABS: 100.0000 mg | ORAL_TABLET | Freq: Two times a day (BID) | ORAL | Status: DC

## 2022-11-09 NOTE — ED Notes (Signed)
MD and RT at bedside upon pt arrival

## 2022-11-09 NOTE — ED Notes (Signed)
This RN called pharmacy pertaining to Keppra medication, pharmacy states they will bring medication down shortly as it is weight based and dosing is specific

## 2022-11-09 NOTE — ED Notes (Signed)
[facility] called for transport at this time. Tara Mahoney

## 2022-11-09 NOTE — Progress Notes (Addendum)
Pt just arrived to unit via [facility] from [facility] transport team reports that in route around 2015 pt started to seize , lasted for a few minutes, reports giving 1.5 of versed . Reports that pt received 5 earlier today so now has had a total of 7.5 of versed. Vitals stable and close to baseline. Pt lethargic , responds only to pain. Neuro provider on call paged , states to continue to monitor at this time. See new orders. Telemetry applied , continuous pulse ox on pt sats are ranging from 94-95% on room air.  Seizure precautions initiated ( Suction, o2, seizure pads in place). Bed alarm set with call bell within reach.   11/09/22 2039  Vitals  Temp 98.2 F (36.8 C)  Temp Source Oral  BP (!) 102/56  MAP (mmHg) 70  BP Location Left Arm  BP Method Automatic  Patient Position (if appropriate) Lying  Pulse Rate 83  Pulse Rate Source Dinamap  Resp 18  Level of Consciousness  Level of Consciousness Responds to Pain  MEWS COLOR  MEWS Score Color Yellow  Oxygen Therapy  SpO2 98 %  MEWS Score  MEWS Temp 0  MEWS Systolic 0  MEWS Pulse 0  MEWS RR 0  MEWS LOC 2  MEWS Score 2  Provider Notification  Provider Name/Title Bhagat MD (Neuro)  Date Provider Notified 11/09/22  Time Provider Notified 2038  Method of Notification Page  Notification Reason Other (Comment) (Pt just arrived from [facility], pt letahrgic. See progress note)  Provider response Evaluate remotely  Date of Provider Response 11/09/22  Time of Provider Response 2059

## 2022-11-09 NOTE — Progress Notes (Signed)
Per MD , pt currently having nonepileptic activity. EEG tech on the way. RN & tech notified.

## 2022-11-09 NOTE — Procedures (Signed)
Patient Name: Idonia Zollinger  MRN: [id number]  Epilepsy Attending: Charlsie Quest  Referring Physician/Provider: Gloris Manchester, MD  Date: 11/09/2022 Duration: 26.11 mins  Patient history: 37yo f with seizure like activity getting eeg to evaluate for seizure  Level of alertness: Awake, asleep  AEDs during EEG study: GBP, LTG, TPM  Technical aspects: This EEG study was done with scalp electrodes positioned according to the 10-20 International system of electrode placement. Electrical activity was reviewed with band pass filter of 1-70Hz , sensitivity of 7 uV/mm, display speed of 34mm/sec with a  notched filter applied as appropriate. EEG data were recorded continuously and digitally stored.  Video monitoring was available and reviewed as appropriate.  Description: The posterior dominant rhythm consists of 7.5 Hz activity of moderate voltage (25-35 uV) seen predominantly in posterior head regions, symmetric and reactive to eye opening and eye closing. Sleep was characterized by vertex waves, sleep spindles (12 to 14 Hz), maximal frontocentral region. Hyperventilation and photic stimulation were not performed.     IMPRESSION: This study is within normal limits. No seizures or epileptiform discharges were seen throughout the recording.  A normal interictal EEG does not exclude the diagnosis of epilepsy.  Kamarri Lovvorn Annabelle Harman

## 2022-11-09 NOTE — H&P (Signed)
History and Physical    Patient: Tara Mahoney MWU:[id number] DOB: November 04, 1985 DOA: 11/09/2022 DOS: the patient was seen and examined on 11/09/2022 PCP: [facility]  Patient coming from: Home  Chief Complaint:  Chief Complaint  Patient presents with   Seizures   HPI: Tara Mahoney is a 37 year old female with a history of POTS/dysautonomia, nonsustained ventricular tachycardia, PNES, mitral valve prolapse presenting with seizures.  Around 7:20 AM on 11/09/2022, the patient had tonic-clonic activity.  According to the patient's spouse, the patient normally has a seizure-like episode every morning the last on the order of about 5 minutes.  After that, she becomes lucid and if she is able to go to work as usual.  However on the morning of 11/09/2022, she had a more prolonged episode and was not lucid in between her episodes.  Spouse stated that the patient had recurrent 3 to 5-minute episodes of tonic-clonic activity.  Upon EMS arrival, they noted the patient had tonic-clonic activity.  The patient was given Versed 5 mg IV after which her tonic-clonic to be resolved.  On arrival to the ED, the patient was awake and alert.  However during her stay in the emergency department, the patient would have episodes of unresponsiveness and tachypnea.  EDP spoke with neurology and the patient was given 1 mg dose of Ativan.  Neurology recommended transfer to [facility] for continuous EEG monitoring.  The patient was loaded with Keppra.  Notably, the patient had a hospital admission from 01/02/2022 to 01/06/2022.  She had continuous EEG monitoring at that time.  She was diagnosed with PNES.  She followed up with epilepsy physician, Dr. Patrcia Dolly.  The patient was started on as needed Ativan and her dose of her fluoxetine was increased to 40 mg daily.  And CBT was recommended.  Subsequently, the patient sought a second opinion at [facility] neurology.  She was seen on 07/28/2022.  The patient was started on a  Lamictal titration.  Currently, the patient takes 150 mg in the morning, 150 mg at bedtime. In the ED, the patient was somnolent.  However, she showed volitional movement during my physical examination.  The patient was hemodynamically stable with oxygen saturation 97% room air.  WBC 5.3, hemoglobin 13.1, platelets 162,000.  Sodium 136, potassium 3.8, bicarbonate 20, serum creatinine 1.06.  AST 15, ALT 12, alk phosphatase 33, total bilirubin 0.5.  CPK 64.  Lactic acid 1.5.  Chest x-ray was negative.  EKG shows sinus rhythm without any ST-T wave changes.  Review of Systems: As mentioned in the history of present illness. All other systems reviewed and are negative. Past Medical History:  Diagnosis Date   Atrial fibrillation    Blood transfusion without reported diagnosis    Complication of anesthesia    Dysautonomia    sinus arrythmia   Family history of anesthesia complication    PONV   Headache(784.0)    Heart murmur    MVP (mitral valve prolapse)    PONV (postoperative nausea and vomiting)    POTS (postural orthostatic tachycardia syndrome)    Stroke 11/08/2021   Ventricular tachycardia, nonsustained    Past Surgical History:  Procedure Laterality Date   ABDOMINAL HYSTERECTOMY     CERVICAL CERCLAGE  2008   CERVICAL CERCLAGE  09/08/2011   Procedure: CERCLAGE CERVICAL;  Surgeon: Lazaro Arms, MD;  Location: [facility];  Service: Gynecology;  Laterality: N/A;  Tara Mahoney,CST-scrubbed; doctor scrubbed in @ [phone]   LAPAROSCOPIC LYSIS OF ADHESIONS  08/09/2012  Procedure: LAPAROSCOPIC LYSIS OF ADHESIONS;  Surgeon: Lazaro Arms, MD;  Location: [facility];  Service: Gynecology;  Laterality: N/A;   LAPAROSCOPY  08/09/2012   Procedure: LAPAROSCOPY DIAGNOSTIC;  Surgeon: Lazaro Arms, MD;  Location: [facility]S;  Service: Gynecology;  Laterality: N/A;  started at  1208-Vaginal trachelectomy (vaginal removal of cervical stump)    MYRINGOTOMY     SUPRACERVICAL ABDOMINAL HYSTERECTOMY  01/26/2012    Procedure: HYSTERECTOMY SUPRACERVICAL ABDOMINAL;  Surgeon: Lazaro Arms, MD;  Location: [facility];  Service: Gynecology;;  converted [id number]   TEE WITHOUT CARDIOVERSION N/A 07/16/2022   Procedure: TRANSESOPHAGEAL ECHOCARDIOGRAM (TEE);  Surgeon: Meriam Sprague, MD;  Location: [facility] ENDOSCOPY;  Service: Cardiovascular;  Laterality: N/A;   Social History:  reports that she quit smoking about 13 years ago. Her smoking use included cigarettes. She started smoking about 21 years ago. She has never been exposed to tobacco smoke. She has never used smokeless tobacco. She reports that she does not drink alcohol and does not use drugs.  Allergies  Allergen Reactions   Neosporin [Bacitracin-Polymyxin B] Swelling and Rash   Calan [Verapamil] Other (See Comments)    Racing heart Dizzyness Headaches Body tremors   Coreg [Carvedilol] Hives and Itching   Definity [Perflutren Lipid Microsphere] Other (See Comments)    Headache   Morphine And Related Nausea And Vomiting   Norco [Hydrocodone-Acetaminophen] Itching and Nausea And Vomiting   Beta Adrenergic Blockers Hives, Itching, Nausea And Vomiting and Rash    Pt has reactions to: Atenolol Carvedilol Metoprolol Nebivolol    Bystolic [Nebivolol Hcl] Rash        Cardizem Cd [Diltiazem Hcl Er Beads] Rash   Tenormin [Atenolol] Itching and Rash   Toprol Xl [Metoprolol] Rash    Headaches Dizziness     Family History  Problem Relation Age of Onset   Hypertension Brother    Heart disease Brother        hole in heart   Ovarian cancer Mother    Anesthesia problems Neg Hx    Hypotension Neg Hx    Pseudochol deficiency Neg Hx    Malignant hyperthermia Neg Hx    Other Neg Hx     Prior to Admission medications   Medication Sig Start Date End Date Taking? Authorizing Provider  Ascorbic Acid (VITAMIN C PO) Take 2,000 mg by mouth daily. 06/01/22  Yes [provider]  aspirin 81 MG chewable tablet Chew 1 tablet (81 mg total) by mouth  daily. Patient taking differently: Chew 81 mg by mouth at bedtime. 01/01/22  Yes Avyana Puffenbarger, Onalee Hua, MD  cloNIDine (CATAPRES - DOSED IN MG/24 HR) 0.1 mg/24hr patch Place 1 patch (0.1 mg total) onto the skin once a week. 02/02/22  Yes Duke Salvia, MD  flecainide (TAMBOCOR) 50 MG tablet Take 1 tablet (50 mg total) by mouth 2 (two) times daily. 09/22/21  Yes Duke Salvia, MD  FLUoxetine (PROZAC) 40 MG capsule TAKE 1 CAPSULE (40 MG TOTAL) BY MOUTH DAILY. 09/17/22  Yes Van Clines, MD  fluticasone [facility]) 50 MCG/ACT nasal spray Place 2 sprays into both nostrils daily as needed for allergies. 08/25/22  Yes [provider]  gabapentin (NEURONTIN) 600 MG tablet Take 600 mg by mouth 2 (two) times daily. 04/27/22  Yes [provider]  lamoTRIgine (LAMICTAL) 25 MG tablet Take 150 mg by mouth 2 (two) times daily.   Yes [provider]  loratadine (CLARITIN) 10 MG tablet Take 10 mg by mouth at bedtime. 06/01/22  Yes [provider]  LORazepam (ATIVAN) 0.5 MG tablet Take 1 tablet once a day as needed for anxiety 01/12/22  Yes Van Clines, MD  Magnesium Oxide 400 MG CAPS Take 1 capsule (400 mg total) by mouth daily. 08/21/21  Yes Duke Salvia, MD  Multiple Vitamin (MULTIVITAMIN WITH MINERALS) TABS tablet Take 1 tablet by mouth every morning.   Yes [provider]  ondansetron (ZOFRAN-ODT) 4 MG disintegrating tablet Take 4 mg by mouth every 6 (six) hours as needed for nausea or vomiting. 06/01/22  Yes [provider]  OVER THE COUNTER MEDICATION Take 1 capsule by mouth daily. Salt stick electrolyte chews   Yes [provider]  simvastatin (ZOCOR) 10 MG tablet Take 10 mg by mouth at bedtime. 10/01/21  Yes [provider]  SUMAtriptan (IMITREX) 50 MG tablet TAKE 2 TABLETS EVERY 2 HOURS AS NEEDED FOR MIGRANE. MAY REPEAT IN 2 HRS IF NEEDED - MAX 2 PER DAY Patient taking differently: Take 50-100 mg by mouth every 2 (two) hours as needed for migraine.  04/20/22  Yes Rodolph Bong, MD  topiramate (TOPAMAX) 100 MG tablet TAKE 1 TABLET BY MOUTH TWICE A DAY 08/11/22  Yes Rodolph Bong, MD  montelukast (SINGULAIR) 10 MG tablet Take 1 tablet (10 mg total) by mouth at bedtime. 08/31/22   Nyoka Cowden, MD    Physical Exam: Vitals:   11/09/22 0901 11/09/22 0903 11/09/22 0904 11/09/22 0906  BP:  111/71    Pulse:  (!) 102 96   Resp:  17    Temp:    99.6 F (37.6 C)  TempSrc:    Rectal  SpO2:  97% 97%   Weight: 70.1 kg     Height: 5\' 3"  (1.6 m)     GENERAL:  A&O x 3, NAD, well developed, cooperative, follows commands HEENT: [place]/AT, No thrush, No icterus, No oral ulcers Neck:  No neck mass, No meningismus, soft, supple CV: RRR, no S3, no S4, no rub, no JVD Lungs:  CTA, no wheeze, no rhonchi, good air movement Abd: soft/NT +BS, nondistended Ext: No edema, no lymphangitis, no cyanosis, no rashes Neuro: Patient showed volitional movement of her torso, and arms.  No gaze preference.  Pupils reactive.  Data Reviewed: Data reviewed above in history  Assessment and Plan: Seizure-like activity -Patient had previous history of PNES -EDP spoke with neurology>> recommended transfer to [facility] for continuous EEG monitoring -Patient loaded with Keppra in the emergency department -Start home dose Lamictal if patient is alert enough to take p.o. -Restart home dose gabapentin  POTS/autonomic dysfunction -Continue clonidine patch -Outpatient follow-up with Dr. Graciela Husbands -07/16/2022 TEE EF 50%, global HK, moderate MR, +PFO  History of Nonsustained ventricular tachycardia (HCC) Follows with Dr. Graciela Husbands.   - Continue flecainide   Migraine headaches -Continue topiramate   Advance Care Planning: FULL  Consults: neurology  Family Communication: spouse updated 4/23  Severity of Illness: The appropriate patient status for this patient is INPATIENT. Inpatient status is judged to be reasonable and necessary in order to provide the required intensity of  service to ensure the patient's safety. The patient's presenting symptoms, physical exam findings, and initial radiographic and laboratory data in the context of their chronic comorbidities is felt to place them at high risk for further clinical deterioration. Furthermore, it is not anticipated that the patient will be medically stable for discharge from the hospital within 2 midnights of admission.   * I certify that at the point  of admission it is my clinical judgment that the patient will require inpatient hospital care spanning beyond 2 midnights from the point of admission due to high intensity of service, high risk for further deterioration and high frequency of surveillance required.*  Author: Catarina Hartshorn, MD 11/09/2022 11:24 AM  For on call review [email or web address].

## 2022-11-09 NOTE — ED Notes (Signed)
Pt still appears to be very drowsy, will not stay awake long enough to answer any questions, pt does respond to pain, vital signs are stable at this time

## 2022-11-09 NOTE — Hospital Course (Signed)
37 year old female with a history of POTS/dysa55utonomia, nonsustained ventricular tachycardia, PNES, mitral valve prolapse presenting with seizures.  Around 7:20 AM on 11/09/2022, the patient had tonic-clonic activity.  According to the patient's spouse, the patient normally has a seizure-like episode every morning the last on the order of about 5 minutes.  After that, she becomes lucid and if she is able to go to work as usual.  However on the morning of 11/09/2022, she had a more prolonged episode and was not lucid in between her episodes.  Spouse stated that the patient had recurrent 3 to 5-minute episodes of tonic-clonic activity.  Upon EMS arrival, they noted the patient had tonic-clonic activity.  The patient was given Versed 5 mg IV after which her tonic-clonic to be resolved.  On arrival to the ED, the patient was awake and alert.  However during her stay in the emergency department, the patient would have episodes of unresponsiveness and tachypnea.  EDP spoke with neurology and the patient was given 1 mg dose of Ativan.  Neurology recommended transfer to [facility] for continuous EEG monitoring.  The patient was loaded with Keppra.  Notably, the patient had a hospital admission from 01/02/2022 to 01/06/2022.  She had continuous EEG monitoring at that time.  She was diagnosed with PNES.  She followed up with epilepsy physician, Dr. Patrcia Dolly.  The patient was started on as needed Ativan and her dose of her fluoxetine was increased to 40 mg daily.  And CBT was recommended.  Subsequently, the patient sought a second opinion at [facility] neurology.  She was seen on 07/28/2022.  The patient was started on a Lamictal titration.  Currently, the patient takes 150 mg in the morning, 150 mg at bedtime. In the ED, the patient was somnolent.  However, she showed volitional movement during my physical examination.  The patient was hemodynamically stable with oxygen saturation 97% room air.  WBC 5.3, hemoglobin 13.1,  platelets 162,000.  Sodium 136, potassium 3.8, bicarbonate 20, serum creatinine 1.06.  AST 15, ALT 12, alk phosphatase 33, total bilirubin 0.5.  CPK 64.  Lactic acid 1.5.  Chest x-ray was negative.  EKG shows sinus rhythm without any ST-T wave changes.

## 2022-11-09 NOTE — Consult Note (Addendum)
Neurology Consultation Reason for Consult: seizure-like activity Requesting Physician: Onalee Hua Tat  CC: Seizure-like activity   History is obtained from: Aunt at bedside, chart review  HPI: Tara Mahoney is a 37 y.o. female with a past medical history significant for POTS, subcentimeter chronic cerebellar infarct found on imaging incidentally, documented history of psychogenic nonepileptic seizures, migraine headaches  She has been having generalized tonic-clonic seizure-like activity on a daily basis every morning.  These usually resolve but today did not resolve and therefore she presented to [facility] for further evaluation.  She continued to have episodes of tachypnea and unresponsiveness at [facility] and and route during transfer to [facility] for continuous EEG monitoring  On review of outpatient neurology notes she last was seen by Dr. Margret Chance who noted that her episodes had been improving on gabapentin 600 3 times daily.  Therefore lamotrigine was added and gradually uptitrated to current dose of 150 mg twice daily.  She is also on topiramate 100 mg twice daily for migraine prophylaxis.  Telephone note from 10/06/2022 notes that the family was reporting increased seizure frequency from 6-8 times a day to 4 times a week on this regimen.  There was a plan for repeat EMU stay if her seizure activity recurred or was becoming refractory to medications.  Patient continues to work as a Engineer, structural and has 2 young children, 1 preteen and 57 years old.  Aunt at bedside is otherwise not aware of any recent stressors though she did note that the seizure-like activity had been getting better lately.  Husband is not available at the time of my evaluation as he is caring for their children  Aunt confirms that fortunately the patient has not suffered any injuries from this seizure-like activity  ROS: Unable to obtain due to altered mental status.   Past Medical  History:  Diagnosis Date   Atrial fibrillation    Blood transfusion without reported diagnosis    Complication of anesthesia    Dysautonomia    sinus arrythmia   Family history of anesthesia complication    PONV   Headache(784.0)    Heart murmur    MVP (mitral valve prolapse)    PONV (postoperative nausea and vomiting)    POTS (postural orthostatic tachycardia syndrome)    Stroke 11/08/2021   Ventricular tachycardia, nonsustained    Past Surgical History:  Procedure Laterality Date   ABDOMINAL HYSTERECTOMY     CERVICAL CERCLAGE  2008   CERVICAL CERCLAGE  09/08/2011   Procedure: CERCLAGE CERVICAL;  Surgeon: Lazaro Arms, MD;  Location: [facility];  Service: Gynecology;  Laterality: N/A;  [name],CST-scrubbed; doctor scrubbed in @ [phone]   LAPAROSCOPIC LYSIS OF ADHESIONS  08/09/2012   Procedure: LAPAROSCOPIC LYSIS OF ADHESIONS;  Surgeon: Lazaro Arms, MD;  Location: [facility];  Service: Gynecology;  Laterality: N/A;   LAPAROSCOPY  08/09/2012   Procedure: LAPAROSCOPY DIAGNOSTIC;  Surgeon: Lazaro Arms, MD;  Location: [facility];  Service: Gynecology;  Laterality: N/A;  started at  1208-Vaginal trachelectomy (vaginal removal of cervical stump)    MYRINGOTOMY     SUPRACERVICAL ABDOMINAL HYSTERECTOMY  01/26/2012   Procedure: HYSTERECTOMY SUPRACERVICAL ABDOMINAL;  Surgeon: Lazaro Arms, MD;  Location: [facility];  Service: Gynecology;;  converted [id number]   TEE WITHOUT CARDIOVERSION N/A 07/16/2022   Procedure: TRANSESOPHAGEAL ECHOCARDIOGRAM (TEE);  Surgeon: Meriam Sprague, MD;  Location: [facility] ENDOSCOPY;  Service: Cardiovascular;  Laterality: N/A;   Current Outpatient Medications  Medication Instructions   Ascorbic Acid (VITAMIN C PO) 2,000 mg, Oral, Daily   aspirin 81 mg, Oral, Daily   cloNIDine (CATAPRES - DOSED IN MG/24 HR) 0.1 mg, Transdermal, Weekly   flecainide (TAMBOCOR) 50 mg, Oral, 2 times daily   FLUoxetine (PROZAC) 40 mg, Oral, Daily   fluticasone (FLONASE) 50 MCG/ACT nasal spray  2 sprays, Each Nare, Daily PRN   gabapentin (NEURONTIN) 600 mg, Oral, 2 times daily   lamoTRIgine (LAMICTAL) 150 mg, Oral, 2 times daily   loratadine (CLARITIN) 10 mg, Oral, Daily at bedtime   LORazepam (ATIVAN) 0.5 MG tablet Take 1 tablet once a day as needed for anxiety   Magnesium Oxide 400 mg, Oral, Daily   montelukast (SINGULAIR) 10 mg, Oral, Daily at bedtime   Multiple Vitamin (MULTIVITAMIN WITH MINERALS) TABS tablet 1 tablet, Oral, BH-each morning   ondansetron (ZOFRAN-ODT) 4 mg, Oral, Every 6 hours PRN   OVER THE COUNTER MEDICATION 1 capsule, Oral, Daily, Salt stick electrolyte chews   simvastatin (ZOCOR) 10 mg, Oral, Daily at bedtime   SUMAtriptan (IMITREX) 50 MG tablet TAKE 2 TABLETS EVERY 2 HOURS AS NEEDED FOR MIGRANE. MAY REPEAT IN 2 HRS IF NEEDED - MAX 2 PER DAY   topiramate (TOPAMAX) 100 mg, Oral, 2 times daily     Family History  Problem Relation Age of Onset   Hypertension Brother    Heart disease Brother        hole in heart   Ovarian cancer Mother    Anesthesia problems Neg Hx    Hypotension Neg Hx    Pseudochol deficiency Neg Hx    Malignant hyperthermia Neg Hx    Other Neg Hx     Social History:  reports that she quit smoking about 13 years ago. Her smoking use included cigarettes. She started smoking about 21 years ago. She has never been exposed to tobacco smoke. She has never used smokeless tobacco. She reports that she does not drink alcohol and does not use drugs.   Exam: Current vital signs: BP 117/71 (BP Location: Left Arm)   Pulse 77   Temp 98.2 F (36.8 C) (Axillary)   Resp 17   Ht 5\' 3"  (1.6 m)   Wt 70.1 kg   LMP 05/04/2011   SpO2 95%   BMI 27.38 kg/m  Vital signs in last 24 hours: Temp:  [98.1 F (36.7 C)-99.6 F (37.6 C)] 98.2 F (36.8 C) (04/23 2244) Pulse Rate:  [77-102] 77 (04/23 2244) Resp:  [12-22] 17 (04/23 2244) BP: (99-131)/(56-87) 117/71 (04/23 2244) SpO2:  [95 %-100 %] 95 % (04/23 2244) Weight:  [70.1 kg] 70.1 kg (04/23  0901)   Physical Exam  Constitutional: Appears well-developed and well-nourished.  Psych: Not interactive Eyes: No scleral injection HENT: No oropharyngeal obstruction.  MSK: no obvious joint deformities.  Cardiovascular: Perfusing extremities well, regular rhythm Respiratory: Effort normal, non-labored breathing GI: Soft.  No distension. There is no tenderness.  Skin: Warm dry and intact visible skin  Neuro: Mental Status: Not interactive, not following commands Cranial Nerves: II: Pupils are equal round reactive to light III,IV, VI: Roving eye movements V: Facial sensation is symmetric to light eyelash brush VII: Facial movement is symmetric on spontaneous brow furrowing VIII: No clear response to voice Motor: Moving all 4 extremities spontaneously.  When her arm is held above her face and dropped she protects her face.  Paratonia without spasticity. Sensory: Grossly equally reactive throughout Deep Tendon Reflexes: 3+ and symmetric in the brachioradialis and  patellae.  Cerebellar/Gait:  Unable to assess due to mental status  She has an episode during my evaluation with right arm shaking that is variable in frequency/amplitude, distractible.  When I hold her arm that shaking moves into her head.  She has some tonic posturing as well but with sudden movements I can change her arm position.  She has irregular breathing patterns and some groaning, with oxygen saturation briefly dropping to 85% but this is not sustained   I have reviewed labs in epic and the results pertinent to this consultation are:  Basic Metabolic Panel: Recent Labs  Lab 11/09/22 0934  NA 136  K 3.8  CL 108  CO2 20*  GLUCOSE 101*  BUN 16  CREATININE 1.06*  CALCIUM 9.0  MG 1.9    CBC: Recent Labs  Lab 11/09/22 0934  WBC 5.3  NEUTROABS 3.6  HGB 13.1  HCT 38.4  MCV 93.0  PLT 162    Coagulation Studies: No results for input(s): "LABPROT", "INR" in the last 72 hours.    I have reviewed  the images obtained: Head CT personally reviewed, agree with radiology no acute intracranial process   Impression: At this time, patient's examination remains most consistent with psychogenic nonepileptic seizures.  However given the frequency of her events, reasonable to proceed with EEG monitoring to confirm this diagnosis again.  Discussed with family at bedside and [facility] psychogenic nonepileptic seizures reviewed with aunt.    Recommendations: -Long-term EEG monitoring -Minimize use of sedating medications including Ativan and Versed, please use this only for greater than 10 minutes of ongoing seizure-like activity with hemodynamic instability -Holding off on gabapentin, lamotrigine, topiramate at this time given patient is too sedated/poorly interactive to take these medications by mouth.  Additionally holding these medications will increase yield of EEG monitoring -Continue seizure precautions -Neurology will follow along  Brooke Dare MD-PhD [facility] [phone] Available 7 PM to 7 AM, outside of these hours please call Neurologist on call as listed on Amion.

## 2022-11-09 NOTE — ED Notes (Signed)
Patient transported to CT 

## 2022-11-09 NOTE — ED Notes (Signed)
EEG at bedside.

## 2022-11-09 NOTE — ED Notes (Signed)
Husband called per request and updated on plan of care/ transport and room number for [facility]

## 2022-11-09 NOTE — ED Notes (Signed)
Both sets of blood cultures obtained before any antibiotic administration  

## 2022-11-09 NOTE — ED Triage Notes (Signed)
Pt arrived via [facility] c/o seizure activity. Per EMS, pt was still seizing on scene, lasted about 10-15 minutes. Also per EMS, husband states that pt has a Hx of seizures and pt states she did take her seizure medication today. Full body convulsing per EMS, 5 mg of versed given by EMS and pt started to become more alert

## 2022-11-09 NOTE — ED Notes (Signed)
Pt's spouse called and was asking why pt was still in our ED; informed pt that she is going to be admitted to [facility] when a bed becomes available; spouse stated he was on the way here to "wake" pt up and ask her does she want to stay or be transferred or go home;  Nurse informed admitting doctor

## 2022-11-09 NOTE — Progress Notes (Signed)
EEG complete - results pending 

## 2022-11-09 NOTE — ED Notes (Signed)
Patient loaded onto [facility] stretcher for transport.

## 2022-11-09 NOTE — ED Provider Notes (Signed)
[place] EMERGENCY DEPARTMENT AT [facility] Provider Note   CSN: [id number] Arrival date & time: 11/09/22  4098     History  Chief Complaint  Patient presents with   Seizures    Tara Mahoney is a 37 y.o. female.   Seizures Patient presents for seizures.  She has history of the same.  This morning, she was found actively seizing by husband.  Husband called EMS.  When EMS arrived, patient continued to actively seize.  They describe this as full body with stiffening and convulsive activity.  This persisted for approximately 10 minutes.  It was broken by 5 mg of IV Versed.  Patient has had slow, gradual improvement in her mentation since that time.  Patient currently endorses feeling tired.  She denies any current areas of pain.  History is limited by her current postictal state.  Per chart review, medical history includes postconcussive seizures, POTS, mitral valve disorder, PNES.  She is followed by [facility] neurology.  She was last seen in the office in January.  Current medications include topiramate and gabapentin.  She was started on lamotrigine in January.  Other prescribed medications include Ativan as needed, Prozac, flecainide, and clonidine patches.  History per husband: Patient has been slowly increasing her dose of lamotrigine.  Currently, she is on 100 mg dosing.  She has been adherent to all of her home medications.  Typically, she will have a seizure-like episode every morning.  This typically occurs prior to work at around 7 AM.  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  Yesterday did not seem out of the ordinary.  Today, while getting ready for work, patient had a seizure-like episode which was prolonged.  He describes it as her being balled up and stiff.  He had his older daughter washer.  This episode lasted for over an hour.  Ultimately, husband called EMS.     Home Medications Prior to Admission medications   Medication Sig  Start Date End Date Taking? Authorizing Provider  Ascorbic Acid (VITAMIN C PO) Take 2,000 mg by mouth daily. 06/01/22  Yes [provider]  aspirin 81 MG chewable tablet Chew 1 tablet (81 mg total) by mouth daily. Patient taking differently: Chew 81 mg by mouth at bedtime. 01/01/22  Yes Tat, Onalee Hua, MD  cloNIDine (CATAPRES - DOSED IN MG/24 HR) 0.1 mg/24hr patch Place 1 patch (0.1 mg total) onto the skin once a week. 02/02/22  Yes Duke Salvia, MD  flecainide (TAMBOCOR) 50 MG tablet Take 1 tablet (50 mg total) by mouth 2 (two) times daily. 09/22/21  Yes Duke Salvia, MD  FLUoxetine (PROZAC) 40 MG capsule TAKE 1 CAPSULE (40 MG TOTAL) BY MOUTH DAILY. 09/17/22  Yes Van Clines, MD  fluticasone [facility]) 50 MCG/ACT nasal spray Place 2 sprays into both nostrils daily as needed for allergies. 08/25/22  Yes [provider]  gabapentin (NEURONTIN) 600 MG tablet Take 600 mg by mouth 2 (two) times daily. 04/27/22  Yes [provider]  lamoTRIgine (LAMICTAL) 25 MG tablet Take 150 mg by mouth 2 (two) times daily.   Yes [provider]  loratadine (CLARITIN) 10 MG tablet Take 10 mg by mouth at bedtime. 06/01/22  Yes [provider]  LORazepam (ATIVAN) 0.5 MG tablet Take 1 tablet once a day as needed for anxiety 01/12/22  Yes Van Clines, MD  Magnesium Oxide 400 MG CAPS Take 1 capsule (400 mg total)  by mouth daily. 08/21/21  Yes Duke Salvia, MD  Multiple Vitamin (MULTIVITAMIN WITH MINERALS) TABS tablet Take 1 tablet by mouth every morning.   Yes [provider]  ondansetron (ZOFRAN-ODT) 4 MG disintegrating tablet Take 4 mg by mouth every 6 (six) hours as needed for nausea or vomiting. 06/01/22  Yes [provider]  OVER THE COUNTER MEDICATION Take 1 capsule by mouth daily. Salt stick electrolyte chews   Yes [provider]  simvastatin (ZOCOR) 10 MG tablet Take 10 mg by mouth at bedtime. 10/01/21  Yes [provider]  SUMAtriptan  (IMITREX) 50 MG tablet TAKE 2 TABLETS EVERY 2 HOURS AS NEEDED FOR MIGRANE. MAY REPEAT IN 2 HRS IF NEEDED - MAX 2 PER DAY Patient taking differently: Take 50-100 mg by mouth every 2 (two) hours as needed for migraine. 04/20/22  Yes Rodolph Bong, MD  topiramate (TOPAMAX) 100 MG tablet TAKE 1 TABLET BY MOUTH TWICE A DAY 08/11/22  Yes Rodolph Bong, MD  montelukast (SINGULAIR) 10 MG tablet Take 1 tablet (10 mg total) by mouth at bedtime. 08/31/22   Nyoka Cowden, MD      Allergies    Neosporin [bacitracin-polymyxin b], Calan [verapamil], Coreg [carvedilol], Definity [perflutren lipid microsphere], Morphine and related, Norco [hydrocodone-acetaminophen], Beta adrenergic blockers, Bystolic [nebivolol hcl], Cardizem cd [diltiazem hcl er beads], Tenormin [atenolol], and Toprol xl [metoprolol]    Review of Systems   Review of Systems  Constitutional:  Positive for fatigue.  Neurological:  Positive for seizures.  All other systems reviewed and are negative.   Physical Exam Updated Vital Signs BP 101/70   Pulse 78   Temp (!) 95.6 F (35.3 C) (Rectal)   Resp 14   Ht  (1.6 m)   Wt 70.1 kg   LMP 05/04/2011   SpO2 96%   BMI 27.38 kg/m  Physical Exam Vitals and nursing note reviewed.  Constitutional:      General: She is not in acute distress.    Appearance: Normal appearance. She is well-developed. She is not ill-appearing, toxic-appearing or diaphoretic.  HENT:     Head: Normocephalic and atraumatic.     Right Ear: External ear normal.     Left Ear: External ear normal.     Nose: Nose normal.     Mouth/Throat:     Mouth: Mucous membranes are moist.  Eyes:     Extraocular Movements: Extraocular movements intact.     Conjunctiva/sclera: Conjunctivae normal.  Cardiovascular:     Rate and Rhythm: Normal rate and regular rhythm.  Pulmonary:     Effort: Pulmonary effort is normal. No respiratory distress.     Breath sounds: No wheezing or rales.  Abdominal:     General: There is no  distension.     Palpations: Abdomen is soft.     Tenderness: There is no abdominal tenderness.  Musculoskeletal:        General: No swelling. Normal range of motion.     Cervical back: Normal range of motion and neck supple.     Right lower leg: No edema.     Left lower leg: No edema.  Skin:    General: Skin is warm and dry.     Coloration: Skin is not jaundiced or pale.  Neurological:     General: No focal deficit present.     Mental Status: She is alert. She is disoriented.     Cranial Nerves: No cranial nerve deficit.     Motor:  Weakness (Global) present.  Psychiatric:        Mood and Affect: Mood normal.        Behavior: Behavior normal.     ED Results / Procedures / Treatments   Labs (all labs ordered are listed, but only abnormal results are displayed) Labs Reviewed  COMPREHENSIVE METABOLIC PANEL - Abnormal; Notable for the following components:      Result Value   CO2 20 (*)    Glucose, Bld 101 (*)    Creatinine, Ser 1.06 (*)    Alkaline Phosphatase 33 (*)    All other components within normal limits  URINALYSIS, W/ REFLEX TO CULTURE (INFECTION SUSPECTED) - Abnormal; Notable for the following components:   Color, Urine STRAW (*)    All other components within normal limits  RAPID URINE DRUG SCREEN, HOSP PERFORMED - Abnormal; Notable for the following components:   Benzodiazepines POSITIVE (*)    All other components within normal limits  ACETAMINOPHEN LEVEL - Abnormal; Notable for the following components:   Acetaminophen (Tylenol), Serum <10 (*)    All other components within normal limits  SALICYLATE LEVEL - Abnormal; Notable for the following components:   Salicylate Lvl <7.0 (*)    All other components within normal limits  CBG MONITORING, ED - Abnormal; Notable for the following components:   Glucose-Capillary 103 (*)    All other components within normal limits  RESP PANEL BY RT-PCR (RSV, FLU A&B, COVID)  RVPGX2  CULTURE, BLOOD (ROUTINE X 2)  CULTURE,  BLOOD (ROUTINE X 2)  LACTIC ACID, PLASMA  LACTIC ACID, PLASMA  CBC WITH DIFFERENTIAL/PLATELET  MAGNESIUM  ETHANOL  CK  LAMOTRIGINE LEVEL  I-STAT CHEM 8, ED    EKG EKG Interpretation  Date/Time:  Tuesday November 09 2022 09:02:29 EDT Ventricular Rate:  100 PR Interval:  168 QRS Duration: 92 QT Interval:  368 QTC Calculation: 475 R Axis:   90 Text Interpretation: Sinus tachycardia Consider left atrial enlargement Borderline right axis deviation Confirmed by Gloris Manchester ([id number]) on 11/09/2022 9:10:56 AM  Radiology CT HEAD WO CONTRAST  Result Date: 11/09/2022 CLINICAL DATA:  Provided history: Seizure, new onset, no history of trauma. EXAM: CT HEAD WITHOUT CONTRAST TECHNIQUE: Contiguous axial images were obtained from the base of the skull through the vertex without intravenous contrast. RADIATION DOSE REDUCTION: This exam was performed according to the departmental dose-optimization program which includes automated exposure control, adjustment of the mA and/or kV according to patient size and/or use of iterative reconstruction technique. COMPARISON:  Brain MRI 02/03/2022. Head CT 02/03/2022. Brain MRI 12/30/2021. FINDINGS: Brain: Cerebral volume is normal. 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 There is no acute intracranial hemorrhage. No demarcated cortical infarct. No extra-axial fluid collection. No evidence of an intracranial mass. No midline shift. Vascular: No hyperdense vessel. Skull: No fracture or aggressive osseous lesion. Sinuses/Orbits: No mass or acute finding within the imaged orbits. No significant paranasal sinus disease at the imaged levels. IMPRESSION: No evidence of an acute intracranial abnormality. Electronically Signed   By: Jackey Loge D.O.   On: 11/09/2022 12:41   DG Chest Port 1 View  Result Date: 11/09/2022 CLINICAL DATA:  Questionable sepsis, evaluate for  abnormality. Seizure today. EXAM: PORTABLE CHEST 1 VIEW COMPARISON:  08/31/2022. FINDINGS: Low lung volumes accentuate the pulmonary vasculature and cardiomediastinal silhouette. No consolidation or pulmonary edema. No pleural effusion or pneumothorax. Visualized bones and upper abdomen are unremarkable. IMPRESSION:  No evidence of acute cardiopulmonary disease. Electronically Signed   By: Orvan Falconer M.D.   On: 11/09/2022 09:29    Procedures Procedures    Medications Ordered in ED Medications  lactated ringers bolus 1,000 mL (0 mLs Intravenous Stopped 11/09/22 1147)  LORazepam (ATIVAN) injection 1 mg (1 mg Intravenous Given 11/09/22 0959)  levETIRAcetam (KEPPRA) 4,210 mg in sodium chloride 0.9 % 250 mL IVPB (0 mg Intravenous Stopped 11/09/22 1147)    ED Course/ Medical Decision Making/ A&P                             Medical Decision Making Amount and/or Complexity of Data Reviewed Labs: ordered. Radiology: ordered. ECG/medicine tests: ordered.  Risk Prescription drug management. Decision regarding hospitalization.   This patient presents to the ED for concern of seizure-like activity, this involves an extensive number of treatment options, and is a complaint that carries with it a high risk of complications and morbidity.  The differential diagnosis includes seizures, status epilepticus, PNES, syncope   Co morbidities that complicate the patient evaluation  Dysautonomia, CVA, atrial fibrillation, mitral valve disorder, PNES   Additional history obtained:  Additional history obtained from EMS, patient's husband External records from outside source obtained and reviewed including EMR   Lab Tests:  I Ordered, and personally interpreted labs.  The pertinent results include: Baseline mild decrease in bicarb, normal electrolytes, normal lactate and CK, normal hemoglobin, no leukocytosis   Imaging Studies ordered:  I ordered imaging studies including chest x-ray, CT  head I independently visualized and interpreted imaging which showed no acute findings  I agree with the radiologist interpretation   Cardiac Monitoring: / EKG:  The patient was maintained on a cardiac monitor.  I personally viewed and interpreted the cardiac monitored which showed an underlying rhythm of: Sinus rhythm   Consultations Obtained:  I requested consultation with the neurologist, Dr. Selina Cooley,  and discussed lab and imaging findings as well as pertinent plan - they recommend: Admission to [facility] for continuous EEG   Problem List / ED Course / Critical interventions / Medication management  Patient is a 37 year old female with history of recurrent seizure-like episodes since last summer.  Per husband, these typically occur daily in the mornings.  This morning, she had an episode which was different than prior episodes and that it was longer.  EMS also stated that they witnessed at least 10 minutes of seizure-like activity.  This resolved with 5 mg of IV Versed.  On arrival, patient is awake and alert.  Patient was placed on bedside cardiac monitor.  Diagnostic workup was initiated.  On review of chart, patient has had negative EEGs in the past and PNES is suspected.  Because of this history, no AEDs were ordered initially.  While in the ED, patient would have intermittent episodes of unresponsiveness and tachypnea.  When checking her eyes, patient would purposefully roll eyes backwards.  A 1 mg dose of Ativan was given.  I spoke with neurologist on-call for consideration of EEG, in the setting of these episodes.  Neurologist recommends admission to [facility] for continuous EEG.  She also recommends loading with status dose of Keppra.  This was ordered.  Laboratory results are unremarkable.  CT head was also unremarkable.  Patient was admitted to medicine for further management. I ordered medication including IV fluids for hydration; Keppra and Ativan for seizure-like  activity Reevaluation of the patient after these medicines showed  that the patient improved I have reviewed the patients home medicines and have made adjustments as needed   Social Determinants of Health:  Has access to outpatient care         Final Clinical Impression(s) / ED Diagnoses Final diagnoses:  Seizure-like activity    Rx / DC Orders ED Discharge Orders     None         Gloris Manchester, MD 11/09/22 1330

## 2022-11-10 DIAGNOSIS — I4729 Other ventricular tachycardia: Secondary | ICD-10-CM | POA: Diagnosis not present

## 2022-11-10 DIAGNOSIS — R569 Unspecified convulsions: Secondary | ICD-10-CM | POA: Diagnosis not present

## 2022-11-10 DIAGNOSIS — G909 Disorder of the autonomic nervous system, unspecified: Secondary | ICD-10-CM | POA: Diagnosis not present

## 2022-11-10 LAB — LAMOTRIGINE LEVEL: Lamotrigine Lvl: 10.3 ug/mL (ref 2.0–20.0)

## 2022-11-10 MED ORDER — SUMATRIPTAN SUCCINATE 50 MG PO TABS
50.0000 mg | ORAL_TABLET | ORAL | Status: AC
  Administered 2022-11-10 (×2): 50 mg via ORAL
  Filled 2022-11-10 (×3): qty 1

## 2022-11-10 NOTE — Progress Notes (Signed)
LTM EEG hooked up and running - no initial skin breakdown - push button tested - [facility] monitoring.  

## 2022-11-10 NOTE — Plan of Care (Signed)

## 2022-11-10 NOTE — Procedures (Addendum)
Patient Name: Tara Mahoney  MRN: [id number]  Epilepsy Attending: Charlsie Quest  Referring Physician/Provider: Gordy Councilman, MD  Duration: 11/09/2022 2309 to 11/11/2022 0034   Patient history: 37yo f with seizure like activity getting eeg to evaluate for seizure   Level of alertness: Awake, asleep   AEDs during EEG study: GBP, LTG, TPM   Technical aspects: This EEG study was done with scalp electrodes positioned according to the 10-20 International system of electrode placement. Electrical activity was reviewed with band pass filter of 1-70Hz , sensitivity of 7 uV/mm, display speed of 55mm/sec with a  notched filter applied as appropriate. EEG data were recorded continuously and digitally stored.  Video monitoring was available and reviewed as appropriate.   Description: The posterior dominant rhythm consists of 8 Hz activity of moderate voltage (25-35 uV) seen predominantly in posterior head regions, symmetric and reactive to eye opening and eye closing. Sleep was characterized by vertex waves, sleep spindles (12 to 14 Hz), maximal frontocentral region, posterior occipital sharp transients of sleep. Hyperventilation and photic stimulation were not performed.     On 11/10/2022 0956, patient was laying in bed and had right hand and arm shaking.  Concomitant EEG before, during and after the event showed normal posterior dominant rhythm and did not show any EEG changes to suggest seizure   IMPRESSION: This study is within normal limits. No seizures or epileptiform discharges were seen throughout the recording.  One event was recorded on 11/10/2022 at 0956 as described above without concomitant EEG change.  This was a nonepileptic event.   A normal interictal EEG does not exclude the diagnosis of epilepsy.   Zack Crager Annabelle Harman

## 2022-11-10 NOTE — Progress Notes (Addendum)
Subjective: Had another episode this morning without EEG correlate.  Husband at bedside.  ROS: negative except above  Examination  Vital signs in last 24 hours: Temp:  [98.1 F (36.7 C)-98.6 F (37 C)] 98.4 F (36.9 C) (04/24 0817) Pulse Rate:  [73-88] 85 (04/24 1144) Resp:  [13-22] 16 (04/24 1144) BP: (99-119)/(56-76) 111/65 (04/24 1144) SpO2:  [95 %-100 %] 97 % (04/24 1144)  General: lying in bed, NAD Neuro: MS: Alert, oriented, follows commands CN: pupils equal and reactive,  EOMI, face symmetric, tongue midline, normal sensation over face, Motor: 4/5 strength in all 4 extremities   Basic Metabolic Panel: Recent Labs  Lab 11/09/22 0934  NA 136  K 3.8  CL 108  CO2 20*  GLUCOSE 101*  BUN 16  CREATININE 1.06*  CALCIUM 9.0  MG 1.9    CBC: Recent Labs  Lab 11/09/22 0934  WBC 5.3  NEUTROABS 3.6  HGB 13.1  HCT 38.4  MCV 93.0  PLT 162    Coagulation Studies: No results for input(s): "LABPROT", "INR" in the last 72 hours.  Imaging CT head without contrast 11/09/2022: No acute abnormality.  ASSESSMENT AND PLAN: 37 year old female with history of like activity, prior diagnosis of nonepileptic events who presented with prolonged seizure-like activity.  Seizure-like activity -Another event recorded this morning did not show any EEG correlate.  This was a nonepileptic event.  Recommendations -Continue gabapentin 600 mg twice daily, lamotrigine 150 mg twice daily and Topamax 100 mg twice daily. -Patient sees Dr. Mertie Clause neurology and requested transfer.  I was able to contact [facility] transfer center and spoke with Dr. Margret Chance.  Unfortunately he is not on EMU service this week.  He is going to contact his colleagues on EMU and see if they have a bed available.  If not, he is recommending discharge if patient is stable and will schedule an EMU admission at [facility]. -Continue seizure precautions -Avoid Ativan unless EEG abnormal or vital signs unstable -Discussed  overnight EEG findings with husband at bedside as well as patient   I have spent a total of  40  minutes with the patient reviewing hospital notes,  test results, labs and examining the patient as well as establishing an assessment and plan that was discussed personally with the patient.  > 50% of time was spent in direct patient care.         Lindie Spruce Epilepsy [facility] For questions after 5pm please refer to AMION to reach the Neurologist on call

## 2022-11-10 NOTE — Progress Notes (Signed)
PROGRESS NOTE  Tara Mahoney ZOX:[id number] DOB: 23-Jun-1986   PCP: [place] Ort [facility]  Patient is from: Home 0).  DOA: 11/09/2022 LOS: 1  Chief complaints Chief Complaint  Patient presents with   Seizures     Brief Narrative / Interim history: 37 year old female with a history of POTS/dysautonomia, NSVT, PNES, mitral valve prolapse and migraine headache presenting with seizure-like activity.  Around 7:20 AM on 11/09/2022, the patient had tonic-clonic activity.  According to the patient's spouse, the patient normally has a seizure-like episode every morning the last on the order of about 5 minutes.  After that, she becomes lucid and if she is able to go to work as usual.  However on the morning of 11/09/2022, she had a more prolonged episode and was not lucid in between her episodes.  Spouse stated that the patient had recurrent 3 to 5-minute episodes of tonic-clonic activity.  Upon EMS arrival, they noted the patient had tonic-clonic activity.  The patient was given Versed 5 mg IV after which her tonic-clonic to be resolved.  On arrival to the ED, the patient was awake and alert.  However, during her stay in the emergency department, the patient would have episodes of unresponsiveness and tachypnea.  EDP spoke with neurology and the patient was given 1 mg dose of Ativan.  Neurology recommended transfer to [facility] for continuous EEG monitoring.  The patient was loaded with Keppra.   Notably, the patient had a hospital admission from 01/02/2022 to 01/06/2022.  She had continuous EEG monitoring at that time.  She was diagnosed with PNES.  She followed up with epilepsy physician, Dr. Patrcia Dolly.  The patient was started on as needed Ativan and her dose of her fluoxetine was increased to 40 mg daily.  And CBT was recommended.  Subsequently, the patient sought a second opinion at [facility] neurology.  She was seen on 07/28/2022.  The patient was started on a Lamictal titration.  Currently, the patient  takes 150 mg in the morning, 150 mg at bedtime. In the ED, the patient was somnolent.  However, she showed volitional movement during my physical examination.  The patient was hemodynamically stable with oxygen saturation 97% room air.  WBC 5.3, hemoglobin 13.1, platelets 162,000.  Sodium 136, potassium 3.8, bicarbonate 20, serum creatinine 1.06.  AST 15, ALT 12, alk phosphatase 33, total bilirubin 0.5.  CPK 64.  Lactic acid 1.5.  Chest x-ray was negative.  EKG shows sinus rhythm without any ST-T wave changes.  At [facility], patient had LTM EEG that was negative for seizure or epileptiform discharge.  Concern about PNES per neurology.  Patient to be transferred to [facility] per neurology.    Subjective: Seen and examined earlier this morning.  No major events overnight of this morning.  She reports migraine headache with associated nausea.  She disagrees with the neurologist diagnosis of PNES.  She says she was told to have epilepsy by her neurologist.  Objective: Vitals:   11/09/22 2330 11/10/22 0317 11/10/22 0817 11/10/22 1144  BP: 104/65 100/68 111/65 111/65  Pulse: 73 80 81 85  Resp:  16 16 16   Temp: 98.3 F (36.8 C) 98.6 F (37 C) 98.4 F (36.9 C)   TempSrc: Oral Axillary  Oral  SpO2: 97% 98% 99% 97%  Weight:      Height:        Examination:  GENERAL: No apparent distress.  Nontoxic. HEENT: MMM.  Vision and hearing grossly intact.  NECK: Supple.  No  apparent JVD.  RESP:  No IWOB.  Fair aeration bilaterally. CVS:  RRR. Heart sounds normal.  ABD/GI/GU: BS+. Abd soft, NTND.  MSK/EXT:  Moves extremities. No apparent deformity. No edema.  SKIN: no apparent skin lesion or wound NEURO: Awake, alert and oriented appropriately.  No apparent focal neuro deficit. PSYCH: Calm. Normal affect.   Procedures:  None  Microbiology summarized: COVID-19, influenza and RSV PCR nonreactive Blood cultures NGTD  Assessment and plan: Principal Problem:   Seizure Active Problems:    Nonsustained ventricular tachycardia (HCC)   Autonomic dysfunction   Psychogenic nonepileptic seizure  Seizure-like activity: Concern about PNES based on prior and current workup.  Patient not in agreement.  CT head without acute finding.  Labs benign.  UDS positive for benzo.  Blood cultures NGTD.  LTM EEG negative for seizure or epileptiform discharge.  Reportedly not postictal after seizure-like activities for most part. -Received Keppra in ED. -On Topamax, Lamictal and gabapentin -Neurology following -Plan for transfer to [facility] per neurology.   POTS/autonomic dysfunction: 07/16/2022 TEE EF 50%, global HK, moderate MR, +PFO -Continue clonidine patch -Outpatient follow-up with Dr. Graciela Husbands  History of NSVT: Follows with Dr. Graciela Husbands. -Continues on flecainide   Migraine headaches -Continue topiramate -Resume home Imitrex.  Body mass index is 27.38 kg/m.          DVT prophylaxis:  heparin injection 5,000 Units Start: 11/09/22 1500  Code Status: Full code Family Communication: None at bedside Level of care: Progressive Status is: Inpatient Remains inpatient appropriate because: Seizure-like activity   Final disposition: Transfer to [facility]:  Neurology  35 minutes with more than 50% spent in reviewing records, counseling patient/family and coordinating care.   Sch Meds:  Scheduled Meds:  aspirin  81 mg Oral QHS   [START ON 11/16/2022] cloNIDine  0.1 mg Transdermal Weekly   flecainide  50 mg Oral BID   FLUoxetine  40 mg Oral Daily   gabapentin  600 mg Oral BID   heparin  5,000 Units Subcutaneous Q8H   lamoTRIgine  150 mg Oral BID   loratadine  10 mg Oral QHS   magnesium oxide  400 mg Oral Daily   montelukast  10 mg Oral QHS   multivitamin with minerals  1 tablet Oral BH-q7a   simvastatin  10 mg Oral QHS   topiramate  100 mg Oral BID   Continuous Infusions: PRN Meds:.acetaminophen **OR** acetaminophen, fluticasone, LORazepam, ondansetron **OR** ondansetron  (ZOFRAN) IV  Antimicrobials: Anti-infectives (From admission, onward)    None        I have personally reviewed the following labs and images: CBC: Recent Labs  Lab 11/09/22 0934  WBC 5.3  NEUTROABS 3.6  HGB 13.1  HCT 38.4  MCV 93.0  PLT 162   BMP &GFR Recent Labs  Lab 11/09/22 0934  NA 136  K 3.8  CL 108  CO2 20*  GLUCOSE 101*  BUN 16  CREATININE 1.06*  CALCIUM 9.0  MG 1.9   Estimated Creatinine Clearance: 66.9 mL/min (A) (by C-G formula based on SCr of 1.06 mg/dL (H)). Liver & Pancreas: Recent Labs  Lab 11/09/22 0934  AST 15  ALT 12  ALKPHOS 33*  BILITOT 0.5  PROT 6.6  ALBUMIN 3.9   No results for input(s): "LIPASE", "AMYLASE" in the last 168 hours. No results for input(s): "AMMONIA" in the last 168 hours. Diabetic: No results for input(s): "HGBA1C" in the last 72 hours. Recent Labs  Lab 11/09/22 0904  GLUCAP 103*   Cardiac  Enzymes: Recent Labs  Lab 11/09/22 0934  CKTOTAL 64   No results for input(s): "PROBNP" in the last 8760 hours. Coagulation Profile: No results for input(s): "INR", "PROTIME" in the last 168 hours. Thyroid Function Tests: No results for input(s): "TSH", "T4TOTAL", "FREET4", "T3FREE", "THYROIDAB" in the last 72 hours. Lipid Profile: No results for input(s): "CHOL", "HDL", "LDLCALC", "TRIG", "CHOLHDL", "LDLDIRECT" in the last 72 hours. Anemia Panel: No results for input(s): "VITAMINB12", "FOLATE", "FERRITIN", "TIBC", "IRON", "RETICCTPCT" in the last 72 hours. Urine analysis:    Component Value Date/Time   COLORURINE STRAW (A) 11/09/2022 0954   APPEARANCEUR CLEAR 11/09/2022 0954   LABSPEC 1.009 11/09/2022 0954   PHURINE 7.0 11/09/2022 0954   GLUCOSEU NEGATIVE 11/09/2022 0954   HGBUR NEGATIVE 11/09/2022 0954   BILIRUBINUR NEGATIVE 11/09/2022 0954   KETONESUR NEGATIVE 11/09/2022 0954   PROTEINUR NEGATIVE 11/09/2022 0954   UROBILINOGEN 0.2 01/16/2015 1409   NITRITE NEGATIVE 11/09/2022 0954   LEUKOCYTESUR NEGATIVE  11/09/2022 0954   Sepsis Labs: Invalid input(s): "PROCALCITONIN", "LACTICIDVEN"  Microbiology: Recent Results (from the past 240 hour(s))  Resp panel by RT-PCR (RSV, Flu A&B, Covid) Anterior Nasal Swab     Status: None   Collection Time: 11/09/22  9:19 AM   Specimen: Anterior Nasal Swab  Result Value Ref Range Status   SARS Coronavirus 2 by RT PCR NEGATIVE NEGATIVE Final    Comment: (NOTE) SARS-CoV-2 target nucleic acids are NOT DETECTED.  The SARS-CoV-2 RNA is generally detectable in upper respiratory specimens during the acute phase of infection. The lowest concentration of SARS-CoV-2 viral copies this assay can detect is 138 copies/mL. A negative result does not preclude SARS-Cov-2 infection and should not be used as the sole basis for treatment or other patient management decisions. A negative result may occur with  improper specimen collection/handling, submission of specimen other than nasopharyngeal swab, presence of viral mutation(s) within the areas targeted by this assay, and inadequate number of viral copies(<138 copies/mL). A negative result must be combined with clinical observations, patient history, and epidemiological information. The expected result is Negative.  Fact Sheet for Patients:  BloggerCourse.com  Fact Sheet for Healthcare Providers:  SeriousBroker.it  This test is no t yet approved or cleared by the [place] FDA and  has been authorized for detection and/or diagnosis of SARS-CoV-2 by FDA under an Emergency Use Authorization (EUA). This EUA will remain  in effect (meaning this test can be used) for the duration of the COVID-19 declaration under Section 564(b)(1) of the Act, 21 U.S.C.section 360bbb-3(b)(1), unless the authorization is terminated  or revoked sooner.       Influenza A by PCR NEGATIVE NEGATIVE Final   Influenza B by PCR NEGATIVE NEGATIVE Final    Comment: (NOTE) The Xpert  Xpress SARS-CoV-2/FLU/RSV plus assay is intended as an aid in the diagnosis of influenza from Nasopharyngeal swab specimens and should not be used as a sole basis for treatment. Nasal washings and aspirates are unacceptable for Xpert Xpress SARS-CoV-2/FLU/RSV testing.  Fact Sheet for Patients: BloggerCourse.com  Fact Sheet for Healthcare Providers: SeriousBroker.it  This test is not yet approved or cleared by the [place] FDA and has been authorized for detection and/or diagnosis of SARS-CoV-2 by FDA under an Emergency Use Authorization (EUA). This EUA will remain in effect (meaning this test can be used) for the duration of the COVID-19 declaration under Section 564(b)(1) of the Act, 21 U.S.C. section 360bbb-3(b)(1), unless the authorization is terminated or revoked.     Resp Syncytial Virus  by PCR NEGATIVE NEGATIVE Final    Comment: (NOTE) Fact Sheet for Patients: BloggerCourse.com  Fact Sheet for Healthcare Providers: SeriousBroker.it  This test is not yet approved or cleared by the [place] FDA and has been authorized for detection and/or diagnosis of SARS-CoV-2 by FDA under an Emergency Use Authorization (EUA). This EUA will remain in effect (meaning this test can be used) for the duration of the COVID-19 declaration under Section 564(b)(1) of the Act, 21 U.S.C. section 360bbb-3(b)(1), unless the authorization is terminated or revoked.  Performed at [facility], [address] [place], [place] [address]   Blood Culture (routine x 2)     Status: None (Preliminary result)   Collection Time: 11/09/22  9:34 AM   Specimen: BLOOD LEFT HAND  Result Value Ref Range Status   Specimen Description BLOOD LEFT HAND  Final   Special Requests   Final    BOTTLES DRAWN AEROBIC AND ANAEROBIC Blood Culture results may not be optimal due to an excessive volume of blood received  in culture bottles   Culture   Final    NO GROWTH < 24 HOURS Performed at [facility], [address] [place], [place] [address]    Report Status PENDING  Incomplete  Blood Culture (routine x 2)     Status: None (Preliminary result)   Collection Time: 11/09/22  9:34 AM   Specimen: BLOOD LEFT FOREARM  Result Value Ref Range Status   Specimen Description BLOOD LEFT FOREARM  Final   Special Requests   Final    BOTTLES DRAWN AEROBIC AND ANAEROBIC Blood Culture results may not be optimal due to an excessive volume of blood received in culture bottles   Culture   Final    NO GROWTH < 24 HOURS Performed at [facility], [address], [place], [place] [address]    Report Status PENDING  Incomplete    Radiology Studies: Overnight EEG with video  Result Date: 11/10/2022 Charlsie Quest, MD     11/10/2022  1:03 PM Patient Name: Tara Mahoney MRN: [id number] Epilepsy Attending: Charlsie Quest Referring Physician/Provider: Gordy Councilman, MD Duration: 11/09/2022 2309 to 11/10/2022 1300  Patient history: 37yo f with seizure like activity getting eeg to evaluate for seizure  Level of alertness: Awake, asleep  AEDs during EEG study: GBP, LTG, TPM  Technical aspects: This EEG study was done with scalp electrodes positioned according to the 10-20 International system of electrode placement. Electrical activity was reviewed with band pass filter of 1-70Hz , sensitivity of 7 uV/mm, display speed of 25mm/sec with a 60Hz  notched filter applied as appropriate. EEG data were recorded continuously and digitally stored.  Video monitoring was available and reviewed as appropriate.  Description: The posterior dominant rhythm consists of 8 Hz activity of moderate voltage (25-35 uV) seen predominantly in posterior head regions, symmetric and reactive to eye opening and eye closing. Sleep was characterized by vertex waves, sleep spindles (12 to 14 Hz), maximal frontocentral region, posterior occipital sharp transients  of sleep. Hyperventilation and photic stimulation were not performed.   On 11/10/2022 0956, patient was laying in bed and had right hand and arm shaking.  Concomitant EEG before, during and after the event showed normal posterior dominant rhythm and did not show any EEG changes to suggest seizure  IMPRESSION: This study is within normal limits. No seizures or epileptiform discharges were seen throughout the recording. One event was recorded on 11/10/2022 at 0956 as described above without concomitant EEG change.  This was  a nonepileptic event.  A normal interictal EEG does not exclude the diagnosis of epilepsy.  Charlsie Quest   EEG adult  Result Date: 11/09/2022 Charlsie Quest, MD     11/09/2022  5:11 PM Patient Name: Tara Mahoney MRN: [id number] Epilepsy Attending: Charlsie Quest Referring Physician/Provider: Gloris Manchester, MD Date: 11/09/2022 Duration: 26.11 mins Patient history: 37yo f with seizure like activity getting eeg to evaluate for seizure Level of alertness: Awake, asleep AEDs during EEG study: GBP, LTG, TPM Technical aspects: This EEG study was done with scalp electrodes positioned according to the 10-20 International system of electrode placement. Electrical activity was reviewed with band pass filter of 1-70Hz , sensitivity of 7 uV/mm, display speed of 23mm/sec with a 60Hz  notched filter applied as appropriate. EEG data were recorded continuously and digitally stored.  Video monitoring was available and reviewed as appropriate. Description: The posterior dominant rhythm consists of 7.5 Hz activity of moderate voltage (25-35 uV) seen predominantly in posterior head regions, symmetric and reactive to eye opening and eye closing. Sleep was characterized by vertex waves, sleep spindles (12 to 14 Hz), maximal frontocentral region. Hyperventilation and photic stimulation were not performed.   IMPRESSION: This study is within normal limits. No seizures or epileptiform discharges were seen throughout the  recording. A normal interictal EEG does not exclude the diagnosis of epilepsy. Tara Mahoney      Tara Mahoney T. Camia Dipinto [facility]  If 7PM-7AM, please contact night-coverage [email or web address] 11/10/2022, 4:37 PM

## 2022-11-10 NOTE — Progress Notes (Signed)
  Transition of Care [facility]) Screening Note   Patient Details  Name: Tara Mahoney Date of Birth: 21-Feb-1986   Transition of Care [facility]) CM/SW Contact:    Kermit Balo, RN Phone Number: 11/10/2022, 3:48 PM   Pt is from home with her spouse. On LT EEG. Transition of Care Department [facility]) has reviewed patient and no TOC needs have been identified at this time. We will continue to monitor patient advancement through interdisciplinary progression rounds. If new patient transition needs arise, please place a TOC consult.

## 2022-11-11 DIAGNOSIS — I4729 Other ventricular tachycardia: Secondary | ICD-10-CM | POA: Diagnosis not present

## 2022-11-11 DIAGNOSIS — G909 Disorder of the autonomic nervous system, unspecified: Secondary | ICD-10-CM | POA: Diagnosis not present

## 2022-11-11 DIAGNOSIS — R569 Unspecified convulsions: Secondary | ICD-10-CM | POA: Diagnosis not present

## 2022-11-11 NOTE — Discharge Summary (Signed)
Physician Discharge Summary  Tara Mahoney ZOX:[id number] DOB: [birth year] DOA: 11/09/2022  PCP: [facility] Ort [facility]  Admit date: 11/09/2022 Discharge date: 11/11/2022 Admitted From: Home through anything hospital. Disposition: [facility].   Discharge Condition: Stable CODE STATUS: Full code   Hospital course 37 year old female with a history of POTS/dysautonomia, NSVT, PNES, mitral valve prolapse and migraine headache presenting with seizure-like activity.  Around 7:20 AM on 11/09/2022, the patient had tonic-clonic activity.  According to the patient's spouse, the patient normally has a seizure-like episode every morning the last on the order of about 5 minutes.  After that, she becomes lucid and if she is able to go to work as usual.  However on the morning of 11/09/2022, she had a more prolonged episode and was not lucid in between her episodes.  Spouse stated that the patient had recurrent 3 to 5-minute episodes of tonic-clonic activity.  Upon EMS arrival, they noted the patient had tonic-clonic activity.  The patient was given Versed 5 mg IV after which her tonic-clonic to be resolved.  On arrival to the ED, the patient was awake and alert.  However, during her stay in the emergency department, the patient would have episodes of unresponsiveness and tachypnea.  EDP spoke with neurology and the patient was given 1 mg dose of Ativan.  Neurology recommended transfer to [facility] for continuous EEG monitoring.  The patient was loaded with Keppra.   Notably, the patient had a hospital admission from 01/02/2022 to 01/06/2022.  She had continuous EEG monitoring at that time.  She was diagnosed with PNES.  She followed up with epilepsy physician, Dr. Patrcia Dolly.  The patient was started on as needed Ativan and her dose of her fluoxetine was increased to 40 mg daily.  And CBT was recommended.  Subsequently, the patient sought a second opinion at [facility] neurology.  She was seen on 07/28/2022.  The  patient was started on a Lamictal titration.  Currently, the patient takes 150 mg in the morning, 150 mg at bedtime. In the ED, the patient was somnolent.  However, she showed volitional movement during my physical examination.  The patient was hemodynamically stable with oxygen saturation 97% room air.  WBC 5.3, hemoglobin 13.1, platelets 162,000.  Sodium 136, potassium 3.8, bicarbonate 20, serum creatinine 1.06.  AST 15, ALT 12, alk phosphatase 33, total bilirubin 0.5.  CPK 64.  Lactic acid 1.5.  Chest x-ray was negative.  EKG shows sinus rhythm without any ST-T wave changes.   At [facility], patient had LTM EEG that was negative for seizure or epileptiform discharge.  Concern about PNES per neurology.  Patient transferred to [facility] by neurology.     See individual problem list below for more.   Problems addressed during this hospitalization Principal Problem:   Seizure Active Problems:   Nonsustained ventricular tachycardia (HCC)   Autonomic dysfunction   Psychogenic nonepileptic seizure   Seizure-like activity: Concern about PNES based on prior and current workup.  Patient not in agreement.  CT head without acute finding.  Labs benign.  UDS positive for benzo.  Blood cultures NGTD.  LTM EEG negative for seizure or epileptiform discharge.  Reportedly not postictal after seizure-like activities for most part. -Received Keppra in ED. -On Topamax, Lamictal and gabapentin -Transferred to [facility] by neurology.   POTS/autonomic dysfunction: 07/16/2022 TEE EF 50%, global HK, moderate MR, +PFO -Continue clonidine patch -Outpatient follow-up with Dr. Graciela Husbands   History of NSVT: Follows with Dr. Graciela Husbands. -Continues on flecainide  Migraine headaches -Continue topiramate -Resume home Imitrex.           Time spent 40 minutes  Vital signs Vitals:   11/10/22 1640 11/10/22 2024 11/10/22 2300 11/11/22 0021  BP: (!) 106/56 105/71 105/66 101/64  Pulse: 79 69 72 85  Temp: 98.5 F (36.9 C) 98.7 F  (37.1 C) 98.3 F (36.8 C) 98.4 F (36.9 C)  Resp: 16  17 19   Height:      Weight:      SpO2: 100% 97% 98% 100%  TempSrc: Oral Oral Oral Oral  BMI (Calculated):         Discharge exam  GENERAL: No apparent distress.  Nontoxic. HEENT: MMM.  Vision and hearing grossly intact.  NECK: Supple.  No apparent JVD.  RESP:  No IWOB.  Fair aeration bilaterally. CVS:  RRR. Heart sounds normal.  ABD/GI/GU: BS+. Abd soft, NTND.  MSK/EXT:  Moves extremities. No apparent deformity. No edema.  SKIN: no apparent skin lesion or wound NEURO: Awake and alert. Oriented appropriately.  No apparent focal neuro deficit. PSYCH: Calm. Normal affect.   Discharge Instructions  Allergies as of 11/11/2022       Reactions   Neosporin [bacitracin-polymyxin B] Swelling, Rash   Calan [verapamil] Other (See Comments)   Racing heart Dizzyness Headaches Body tremors   Coreg [carvedilol] Hives, Itching   Definity [perflutren Lipid Microsphere] Other (See Comments)   Headache   Morphine And Related Nausea And Vomiting   Norco [hydrocodone-acetaminophen] Itching, Nausea And Vomiting   Beta Adrenergic Blockers Hives, Itching, Nausea And Vomiting, Rash   Pt has reactions to: Atenolol Carvedilol Metoprolol Nebivolol    Bystolic [nebivolol Hcl] Rash      Cardizem Cd [diltiazem Hcl Er Beads] Rash   Tenormin [atenolol] Itching, Rash   Toprol Xl [metoprolol] Rash   Headaches Dizziness         Medication List     TAKE these medications    aspirin 81 MG chewable tablet Chew 1 tablet (81 mg total) by mouth daily. What changed: when to take this   cloNIDine 0.1 mg/24hr patch Commonly known as: CATAPRES - Dosed in mg/24 hr Place 1 patch (0.1 mg total) onto the skin once a week.   flecainide 50 MG tablet Commonly known as: TAMBOCOR Take 1 tablet (50 mg total) by mouth 2 (two) times daily.   FLUoxetine 40 MG capsule Commonly known as: PROZAC TAKE 1 CAPSULE (40 MG TOTAL) BY MOUTH DAILY.    fluticasone 50 MCG/ACT nasal spray Commonly known as: FLONASE Place 2 sprays into both nostrils daily as needed for allergies.   gabapentin 600 MG tablet Commonly known as: NEURONTIN Take 600 mg by mouth 2 (two) times daily.   lamoTRIgine 25 MG tablet Commonly known as: LAMICTAL Take 150 mg by mouth 2 (two) times daily.   loratadine 10 MG tablet Commonly known as: CLARITIN Take 10 mg by mouth at bedtime.   LORazepam 0.5 MG tablet Commonly known as: Ativan Take 1 tablet once a day as needed for anxiety   Magnesium Oxide 400 MG Caps Take 1 capsule (400 mg total) by mouth daily.   montelukast 10 MG tablet Commonly known as: SINGULAIR Take 1 tablet (10 mg total) by mouth at bedtime.   multivitamin with minerals Tabs tablet Take 1 tablet by mouth every morning.   ondansetron 4 MG disintegrating tablet Commonly known as: ZOFRAN-ODT Take 4 mg by mouth every 6 (six) hours as needed for nausea or vomiting.   OVER  THE COUNTER MEDICATION Take 1 capsule by mouth daily. Salt stick electrolyte chews   simvastatin 10 MG tablet Commonly known as: ZOCOR Take 10 mg by mouth at bedtime.   SUMAtriptan 50 MG tablet Commonly known as: IMITREX TAKE 2 TABLETS EVERY 2 HOURS AS NEEDED FOR MIGRANE. MAY REPEAT IN 2 HRS IF NEEDED - MAX 2 PER DAY What changed: See the new instructions.   topiramate 100 MG tablet Commonly known as: TOPAMAX TAKE 1 TABLET BY MOUTH TWICE A DAY   VITAMIN C PO Take 2,000 mg by mouth daily.        Consultations: Neurology  Procedures/Studies:   Overnight EEG with video  Result Date: 11/10/2022 Charlsie Quest, MD     11/10/2022  1:03 PM Patient Name: Aryonna Gunnerson MRN: [id number] Epilepsy Attending: Charlsie Quest Referring Physician/Provider: Gordy Councilman, MD Duration: 11/09/2022 2309 to 11/10/2022 1300  Patient history: 37yo f with seizure like activity getting eeg to evaluate for seizure  Level of alertness: Awake, asleep  AEDs during EEG study:  GBP, LTG, TPM  Technical aspects: This EEG study was done with scalp electrodes positioned according to the 10-20 International system of electrode placement. Electrical activity was reviewed with band pass filter of 1-70Hz , sensitivity of 7 uV/mm, display speed of 50mm/sec with a 60Hz  notched filter applied as appropriate. EEG data were recorded continuously and digitally stored.  Video monitoring was available and reviewed as appropriate.  Description: The posterior dominant rhythm consists of 8 Hz activity of moderate voltage (25-35 uV) seen predominantly in posterior head regions, symmetric and reactive to eye opening and eye closing. Sleep was characterized by vertex waves, sleep spindles (12 to 14 Hz), maximal frontocentral region, posterior occipital sharp transients of sleep. Hyperventilation and photic stimulation were not performed.   On 11/10/2022 0956, patient was laying in bed and had right hand and arm shaking.  Concomitant EEG before, during and after the event showed normal posterior dominant rhythm and did not show any EEG changes to suggest seizure  IMPRESSION: This study is within normal limits. No seizures or epileptiform discharges were seen throughout the recording. One event was recorded on 11/10/2022 at 0956 as described above without concomitant EEG change.  This was a nonepileptic event.  A normal interictal EEG does not exclude the diagnosis of epilepsy.  Charlsie Quest   EEG adult  Result Date: 11/09/2022 Charlsie Quest, MD     11/09/2022  5:11 PM Patient Name: Breawna Montenegro MRN: [id number] Epilepsy Attending: Charlsie Quest Referring Physician/Provider: Gloris Manchester, MD Date: 11/09/2022 Duration: 26.11 mins Patient history: 37yo f with seizure like activity getting eeg to evaluate for seizure Level of alertness: Awake, asleep AEDs during EEG study: GBP, LTG, TPM Technical aspects: This EEG study was done with scalp electrodes positioned according to the 10-20 International system of  electrode placement. Electrical activity was reviewed with band pass filter of 1-70Hz , sensitivity of 7 uV/mm, display speed of 22mm/sec with a 60Hz  notched filter applied as appropriate. EEG data were recorded continuously and digitally stored.  Video monitoring was available and reviewed as appropriate. Description: The posterior dominant rhythm consists of 7.5 Hz activity of moderate voltage (25-35 uV) seen predominantly in posterior head regions, symmetric and reactive to eye opening and eye closing. Sleep was characterized by vertex waves, sleep spindles (12 to 14 Hz), maximal frontocentral region. Hyperventilation and photic stimulation were not performed.   IMPRESSION: This study is within normal limits. No seizures or epileptiform discharges were  seen throughout the recording. A normal interictal EEG does not exclude the diagnosis of epilepsy. Charlsie Quest   CT HEAD WO CONTRAST  Result Date: 11/09/2022 CLINICAL DATA:  Provided history: Seizure, new onset, no history of trauma. EXAM: CT HEAD WITHOUT CONTRAST TECHNIQUE: Contiguous axial images were obtained from the base of the skull through the vertex without intravenous contrast. RADIATION DOSE REDUCTION: This exam was performed according to the departmental dose-optimization program which includes automated exposure control, adjustment of the mA and/or kV according to patient size and/or use of iterative reconstruction technique. COMPARISON:  Brain MRI 02/03/2022. Head CT 02/03/2022. Brain MRI 12/30/2021. FINDINGS: Brain: Cerebral volume is normal. 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 There is no acute intracranial hemorrhage. No demarcated cortical infarct. No extra-axial fluid collection. No evidence of an intracranial mass. No midline shift. Vascular: No hyperdense vessel. Skull: No fracture or aggressive osseous lesion. Sinuses/Orbits: No  mass or acute finding within the imaged orbits. No significant paranasal sinus disease at the imaged levels. IMPRESSION: No evidence of an acute intracranial abnormality. Electronically Signed   By: Jackey Loge D.O.   On: 11/09/2022 12:41   DG Chest Port 1 View  Result Date: 11/09/2022 CLINICAL DATA:  Questionable sepsis, evaluate for abnormality. Seizure today. EXAM: PORTABLE CHEST 1 VIEW COMPARISON:  08/31/2022. FINDINGS: Low lung volumes accentuate the pulmonary vasculature and cardiomediastinal silhouette. No consolidation or pulmonary edema. No pleural effusion or pneumothorax. Visualized bones and upper abdomen are unremarkable. IMPRESSION: No evidence of acute cardiopulmonary disease. Electronically Signed   By: Orvan Falconer M.D.   On: 11/09/2022 09:29       The results of significant diagnostics from this hospitalization (including imaging, microbiology, ancillary and laboratory) are listed below for reference.     Microbiology: Recent Results (from the past 240 hour(s))  Resp panel by RT-PCR (RSV, Flu A&B, Covid) Anterior Nasal Swab     Status: None   Collection Time: 11/09/22  9:19 AM   Specimen: Anterior Nasal Swab  Result Value Ref Range Status   SARS Coronavirus 2 by RT PCR NEGATIVE NEGATIVE Final    Comment: (NOTE) SARS-CoV-2 target nucleic acids are NOT DETECTED.  The SARS-CoV-2 RNA is generally detectable in upper respiratory specimens during the acute phase of infection. The lowest concentration of SARS-CoV-2 viral copies this assay can detect is 138 copies/mL. A negative result does not preclude SARS-Cov-2 infection and should not be used as the sole basis for treatment or other patient management decisions. A negative result may occur with  improper specimen collection/handling, submission of specimen other than nasopharyngeal swab, presence of viral mutation(s) within the areas targeted by this assay, and inadequate number of viral copies(<138 copies/mL). A  negative result must be combined with clinical observations, patient history, and epidemiological information. The expected result is Negative.  Fact Sheet for Patients:  BloggerCourse.com  Fact Sheet for Healthcare Providers:  SeriousBroker.it  This test is no t yet approved or cleared by the [place] FDA and  has been authorized for detection and/or diagnosis of SARS-CoV-2 by FDA under an Emergency Use Authorization (EUA). This EUA will remain  in effect (meaning this test can be used) for the duration of the COVID-19 declaration under Section 564(b)(1) of the Act, 21 U.S.C.section 360bbb-3(b)(1), unless the authorization is terminated  or revoked sooner.       Influenza A by PCR NEGATIVE NEGATIVE Final   Influenza  B by PCR NEGATIVE NEGATIVE Final    Comment: (NOTE) The Xpert Xpress SARS-CoV-2/FLU/RSV plus assay is intended as an aid in the diagnosis of influenza from Nasopharyngeal swab specimens and should not be used as a sole basis for treatment. Nasal washings and aspirates are unacceptable for Xpert Xpress SARS-CoV-2/FLU/RSV testing.  Fact Sheet for Patients: BloggerCourse.com  Fact Sheet for Healthcare Providers: SeriousBroker.it  This test is not yet approved or cleared by the [place] FDA and has been authorized for detection and/or diagnosis of SARS-CoV-2 by FDA under an Emergency Use Authorization (EUA). This EUA will remain in effect (meaning this test can be used) for the duration of the COVID-19 declaration under Section 564(b)(1) of the Act, 21 U.S.C. section 360bbb-3(b)(1), unless the authorization is terminated or revoked.     Resp Syncytial Virus by PCR NEGATIVE NEGATIVE Final    Comment: (NOTE) Fact Sheet for Patients: BloggerCourse.com  Fact Sheet for Healthcare  Providers: SeriousBroker.it  This test is not yet approved or cleared by the [place] FDA and has been authorized for detection and/or diagnosis of SARS-CoV-2 by FDA under an Emergency Use Authorization (EUA). This EUA will remain in effect (meaning this test can be used) for the duration of the COVID-19 declaration under Section 564(b)(1) of the Act, 21 U.S.C. section 360bbb-3(b)(1), unless the authorization is terminated or revoked.  Performed at [facility], [address] [place], [place] [address]   Blood Culture (routine x 2)     Status: None (Preliminary result)   Collection Time: 11/09/22  9:34 AM   Specimen: BLOOD LEFT HAND  Result Value Ref Range Status   Specimen Description BLOOD LEFT HAND  Final   Special Requests   Final    BOTTLES DRAWN AEROBIC AND ANAEROBIC Blood Culture results may not be optimal due to an excessive volume of blood received in culture bottles   Culture   Final    NO GROWTH 2 DAYS Performed at [facility], [address] [place], [place] [address]    Report Status PENDING  Incomplete  Blood Culture (routine x 2)     Status: None (Preliminary result)   Collection Time: 11/09/22  9:34 AM   Specimen: BLOOD LEFT FOREARM  Result Value Ref Range Status   Specimen Description BLOOD LEFT FOREARM  Final   Special Requests   Final    BOTTLES DRAWN AEROBIC AND ANAEROBIC Blood Culture results may not be optimal due to an excessive volume of blood received in culture bottles   Culture   Final    NO GROWTH 2 DAYS Performed at [facility], [address] [place], [place] [address]    Report Status PENDING  Incomplete     Labs:  CBC: Recent Labs  Lab 11/09/22 0934  WBC 5.3  NEUTROABS 3.6  HGB 13.1  HCT 38.4  MCV 93.0  PLT 162   BMP &GFR Recent Labs  Lab 11/09/22 0934  NA 136  K 3.8  CL 108  CO2 20*  GLUCOSE 101*  BUN 16  CREATININE 1.06*  CALCIUM 9.0  MG 1.9   Estimated Creatinine Clearance: 66.9  mL/min (A) (by C-G formula based on SCr of 1.06 mg/dL (H)). Liver & Pancreas: Recent Labs  Lab 11/09/22 0934  AST 15  ALT 12  ALKPHOS 33*  BILITOT 0.5  PROT 6.6  ALBUMIN 3.9   No results for input(s): "LIPASE", "AMYLASE" in the last 168 hours. No results for input(s): "AMMONIA" in the last 168 hours. Diabetic: No  results for input(s): "HGBA1C" in the last 72 hours. Recent Labs  Lab 11/09/22 0904  GLUCAP 103*   Cardiac Enzymes: Recent Labs  Lab 11/09/22 0934  CKTOTAL 64   No results for input(s): "PROBNP" in the last 8760 hours. Coagulation Profile: No results for input(s): "INR", "PROTIME" in the last 168 hours. Thyroid Function Tests: No results for input(s): "TSH", "T4TOTAL", "FREET4", "T3FREE", "THYROIDAB" in the last 72 hours. Lipid Profile: No results for input(s): "CHOL", "HDL", "LDLCALC", "TRIG", "CHOLHDL", "LDLDIRECT" in the last 72 hours. Anemia Panel: No results for input(s): "VITAMINB12", "FOLATE", "FERRITIN", "TIBC", "IRON", "RETICCTPCT" in the last 72 hours. Urine analysis:    Component Value Date/Time   COLORURINE STRAW (A) 11/09/2022 0954   APPEARANCEUR CLEAR 11/09/2022 0954   LABSPEC 1.009 11/09/2022 0954   PHURINE 7.0 11/09/2022 0954   GLUCOSEU NEGATIVE 11/09/2022 0954   HGBUR NEGATIVE 11/09/2022 0954   BILIRUBINUR NEGATIVE 11/09/2022 0954   KETONESUR NEGATIVE 11/09/2022 0954   PROTEINUR NEGATIVE 11/09/2022 0954   UROBILINOGEN 0.2 01/16/2015 1409   NITRITE NEGATIVE 11/09/2022 0954   LEUKOCYTESUR NEGATIVE 11/09/2022 0954   Sepsis Labs: Invalid input(s): "PROCALCITONIN", "LACTICIDVEN"   SIGNED:  Almon Hercules, MD  [facility] 11/11/2022, 10:42 AM

## 2022-11-11 NOTE — Progress Notes (Signed)
Pt husband Winefred Hillesheim [phone] informed about her transfer to [facility]. All pt belonging are taken by the pt with her.

## 2022-11-11 NOTE — Progress Notes (Signed)
Per nursing staff, pt was discharged while tech was doing a different study.  Tech was able to give nurse  remover for the collodion , [facility] notified

## 2022-11-11 NOTE — Progress Notes (Signed)
Pt discharged to be transferred to [facility]

## 2022-11-11 NOTE — Progress Notes (Signed)
Report given to Aurea Graff RN of [facility] to transfer. Hospitalist aware

## 2022-11-14 LAB — CULTURE, BLOOD (ROUTINE X 2)
Culture: NO GROWTH
Culture: NO GROWTH

## 2022-11-18 ENCOUNTER — Encounter: Payer: Self-pay | Admitting: Neurology

## 2022-11-18 MED FILL — Midazolam HCl Inj 5 MG/5ML (Base Equivalent): INTRAMUSCULAR | Qty: 2.5 | Status: AC

## 2023-01-03 ENCOUNTER — Other Ambulatory Visit: Payer: Self-pay | Admitting: Family Medicine

## 2023-01-03 NOTE — Telephone Encounter (Signed)
Last OV 09/30/20 Next OV not scheduled  Pt has not been seen in > 1 year.   Needs to schedule f/u visit.

## 2023-02-11 ENCOUNTER — Other Ambulatory Visit: Payer: Self-pay | Admitting: Internal Medicine

## 2023-02-11 ENCOUNTER — Other Ambulatory Visit: Payer: Self-pay | Admitting: Family Medicine

## 2023-02-11 ENCOUNTER — Other Ambulatory Visit: Payer: Self-pay | Admitting: Neurology

## 2023-02-14 ENCOUNTER — Telehealth: Payer: Self-pay | Admitting: Family Medicine

## 2023-02-14 NOTE — Telephone Encounter (Signed)
Spoke with patient and advised per Dr. Denyse Amass. Appt scheduled for 02/21/23 to discuss meds.

## 2023-02-14 NOTE — Telephone Encounter (Signed)
I received a refill request for sumatriptan and MRA.  Some these medicines are not compatible with your new seizure medicine limit triggering.  Recommend return to clinic to figure out a new treatment regimen for you.

## 2023-02-15 ENCOUNTER — Other Ambulatory Visit (HOSPITAL_COMMUNITY): Payer: Self-pay | Admitting: Family Medicine

## 2023-02-15 DIAGNOSIS — Z1231 Encounter for screening mammogram for malignant neoplasm of breast: Secondary | ICD-10-CM

## 2023-02-16 ENCOUNTER — Ambulatory Visit (HOSPITAL_COMMUNITY)
Admission: RE | Admit: 2023-02-16 | Discharge: 2023-02-16 | Disposition: A | Discharge status: Home / Self Care | Payer: BC Managed Care – PPO | Source: Ambulatory Visit | Attending: Family Medicine | Admitting: Family Medicine

## 2023-02-16 ENCOUNTER — Encounter (HOSPITAL_COMMUNITY): Payer: Self-pay

## 2023-02-16 DIAGNOSIS — Z1231 Encounter for screening mammogram for malignant neoplasm of breast: Secondary | ICD-10-CM | POA: Diagnosis not present

## 2023-02-17 ENCOUNTER — Other Ambulatory Visit (HOSPITAL_COMMUNITY)
Admission: RE | Admit: 2023-02-17 | Discharge: 2023-02-17 | Disposition: A | Discharge status: Home / Self Care | Payer: BC Managed Care – PPO | Source: Ambulatory Visit | Attending: Adult Health | Admitting: Adult Health

## 2023-02-17 DIAGNOSIS — Z79899 Other long term (current) drug therapy: Secondary | ICD-10-CM | POA: Diagnosis present

## 2023-02-17 DIAGNOSIS — E7849 Other hyperlipidemia: Secondary | ICD-10-CM | POA: Diagnosis not present

## 2023-02-17 LAB — LIPID PANEL
Cholesterol: 179 mg/dL (ref 0–200)
HDL: 37 mg/dL — ABNORMAL LOW (ref >40)
LDL Cholesterol: 114 mg/dL — ABNORMAL HIGH (ref 0–99)
Total CHOL/HDL Ratio: 4.8 RATIO
Triglycerides: 141 mg/dL (ref <150)
VLDL: 28 mg/dL (ref 0–40)

## 2023-02-17 LAB — COMPREHENSIVE METABOLIC PANEL
ALT: 13 U/L (ref 0–44)
AST: 16 U/L (ref 15–41)
Albumin: 4 g/dL (ref 3.5–5.0)
Alkaline Phosphatase: 37 U/L — ABNORMAL LOW (ref 38–126)
Anion gap: 7 (ref 5–15)
BUN: 13 mg/dL (ref 6–20)
CO2: 23 mmol/L (ref 22–32)
Calcium: 8.6 mg/dL — ABNORMAL LOW (ref 8.9–10.3)
Chloride: 106 mmol/L (ref 98–111)
Creatinine, Ser: 0.89 mg/dL (ref 0.44–1.00)
GFR, Estimated: >60 mL/min (ref >60)
Glucose, Bld: 96 mg/dL (ref 70–99)
Potassium: 3.7 mmol/L (ref 3.5–5.1)
Sodium: 136 mmol/L (ref 135–145)
Total Bilirubin: 0.6 mg/dL (ref 0.3–1.2)
Total Protein: 6.8 g/dL (ref 6.5–8.1)

## 2023-02-17 LAB — CBC WITH DIFFERENTIAL/PLATELET
Abs Immature Granulocytes: 0.01 10*3/uL (ref 0.00–0.07)
Basophils Absolute: 0 10*3/uL (ref 0.0–0.1)
Basophils Relative: 1 %
Eosinophils Absolute: 0.6 10*3/uL — ABNORMAL HIGH (ref 0.0–0.5)
Eosinophils Relative: 12 %
HCT: 40.4 % (ref 36.0–46.0)
Hemoglobin: 14 g/dL (ref 12.0–15.0)
Immature Granulocytes: 0 %
Lymphocytes Relative: 35 %
Lymphs Abs: 1.7 10*3/uL (ref 0.7–4.0)
MCH: 31.4 pg (ref 26.0–34.0)
MCHC: 34.7 g/dL (ref 30.0–36.0)
MCV: 90.6 fL (ref 80.0–100.0)
Monocytes Absolute: 0.3 10*3/uL (ref 0.1–1.0)
Monocytes Relative: 6 %
Neutro Abs: 2.2 10*3/uL (ref 1.7–7.7)
Neutrophils Relative %: 46 %
Platelets: 156 10*3/uL (ref 150–400)
RBC: 4.46 MIL/uL (ref 3.87–5.11)
RDW: 12 % (ref 11.5–15.5)
WBC: 4.9 10*3/uL (ref 4.0–10.5)
nRBC: 0 % (ref 0.0–0.2)

## 2023-02-21 ENCOUNTER — Ambulatory Visit: Payer: BC Managed Care – PPO | Admitting: Family Medicine

## 2023-02-25 ENCOUNTER — Inpatient Hospital Stay: Payer: BC Managed Care – PPO

## 2023-08-26 ENCOUNTER — Inpatient Hospital Stay: Payer: BC Managed Care – PPO

## 2023-09-02 ENCOUNTER — Ambulatory Visit: Payer: BC Managed Care – PPO | Admitting: Physician Assistant

## 2023-09-02 ENCOUNTER — Inpatient Hospital Stay: Payer: BC Managed Care – PPO | Admitting: Oncology

## 2023-09-15 ENCOUNTER — Other Ambulatory Visit: Payer: Self-pay | Admitting: Family Medicine

## 2023-09-16 NOTE — Telephone Encounter (Signed)
Last OV 09/30/20

## 2023-12-06 ENCOUNTER — Other Ambulatory Visit (HOSPITAL_COMMUNITY): Payer: Self-pay | Admitting: Adult Health

## 2023-12-06 DIAGNOSIS — R102 Pelvic and perineal pain: Secondary | ICD-10-CM

## 2023-12-13 ENCOUNTER — Encounter (HOSPITAL_COMMUNITY): Payer: Self-pay

## 2023-12-13 ENCOUNTER — Ambulatory Visit (HOSPITAL_COMMUNITY)

## 2024-03-15 ENCOUNTER — Other Ambulatory Visit (HOSPITAL_COMMUNITY): Payer: Self-pay

## 2024-03-15 DIAGNOSIS — Z1231 Encounter for screening mammogram for malignant neoplasm of breast: Secondary | ICD-10-CM

## 2024-08-03 ENCOUNTER — Other Ambulatory Visit: Payer: Self-pay

## 2024-08-03 ENCOUNTER — Encounter: Payer: Self-pay | Admitting: Emergency Medicine

## 2024-08-03 ENCOUNTER — Ambulatory Visit
Admission: EM | Admit: 2024-08-03 | Discharge: 2024-08-03 | Disposition: A | Discharge status: Home / Self Care | Attending: Nurse Practitioner | Admitting: Nurse Practitioner

## 2024-08-03 DIAGNOSIS — R197 Diarrhea, unspecified: Secondary | ICD-10-CM | POA: Diagnosis not present

## 2024-08-03 DIAGNOSIS — R109 Unspecified abdominal pain: Secondary | ICD-10-CM | POA: Diagnosis not present

## 2024-08-03 LAB — POCT URINE DIPSTICK
Bilirubin, UA: NEGATIVE
Blood, UA: NEGATIVE
Glucose, UA: NEGATIVE mg/dL
Ketones, POC UA: NEGATIVE mg/dL
Leukocytes, UA: NEGATIVE
Nitrite, UA: NEGATIVE
POC PROTEIN,UA: NEGATIVE
Spec Grav, UA: >=1.03 — AB
Urobilinogen, UA: 0.2 U/dL
pH, UA: 5.5

## 2024-08-03 MED ORDER — ONDANSETRON 4 MG PO TBDP: 4.0000 mg | ORAL_TABLET | Freq: Three times a day (TID) | ORAL | 0 refills | Status: AC | PRN

## 2024-08-03 MED ORDER — DIPHENOXYLATE-ATROPINE 2.5-0.025 MG PO TABS: 1.0000 | ORAL_TABLET | Freq: Four times a day (QID) | ORAL | 0 refills | Status: AC | PRN

## 2024-08-03 NOTE — ED Triage Notes (Signed)
 Pt reports diarrhea, abdominal pain/cramping since last Thursday. Reports intermittent nausea as well. Denies any known fevers.reports stool is darker than usual.

## 2024-08-03 NOTE — ED Provider Notes (Signed)
 " [facility]    CSN: [id number] Arrival date & time: 08/03/24  1534      History   Chief Complaint No chief complaint on file.   HPI Tara Mahoney is a 39 y.o. female.   The history is provided by the patient.   Patient presents with a several day history of diarrhea, lower abdominal pain, and generalized fatigue.  Patient states initially when her symptoms started, she did have a fever, Tmax 100.3.  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  She denies upper respiratory symptoms, vomiting, constipation, bloody stools, gas, or bloating.  States that she has noticed that her stool has been darker.  States that after she eats, approximately 5 to 10 minutes later, she develops diarrhea.  She states she has had approximately 10 episodes of diarrhea today.  Patient has not taken any medications for her symptoms.  States that her husband recommended Imodium, but states she did not take it because she wanted what ever was in to come out.  States that she is generally feeling better, but continues to experience fatigue.  Past Medical History:  Diagnosis Date   Atrial fibrillation (HCC)    Blood transfusion without reported diagnosis    Complication of anesthesia    Dysautonomia (HCC)    sinus arrythmia   Family history of anesthesia complication    PONV   Headache(784.0)    Heart murmur    MVP (mitral valve prolapse)    PONV (postoperative nausea and vomiting)    POTS (postural orthostatic tachycardia syndrome)    Stroke (HCC) 11/08/2021   Ventricular tachycardia, nonsustained [facility])     Patient Active Problem List   Diagnosis Date Noted   Seizure (HCC) 11/09/2022   DOE (dyspnea on exertion) 08/31/2022   Malnutrition of moderate degree 01/04/2022   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   Thrombocytopenia 12/31/2021   Syncope 12/30/2021   Intractable chronic migraine without aura and without status migrainosus 09/09/2020    Concussion with no loss of consciousness 09/09/2020   Autonomic dysfunction 03/20/2015   Chest pain 02/27/2014   Sinus tachycardia 02/06/2014   PVC (premature ventricular contraction) 05/09/2013   PAC (premature atrial contraction) 05/09/2013   Nonsustained ventricular tachycardia (HCC) 05/09/2013   Abdominal pain, left lower quadrant 02/20/2013   Mitral valve disorder 09/23/2010   PALPITATIONS 09/23/2010    Past Surgical History:  Procedure Laterality Date   ABDOMINAL HYSTERECTOMY     CERVICAL CERCLAGE  2008   CERVICAL CERCLAGE  09/08/2011   Procedure: CERCLAGE CERVICAL;  Surgeon: Vonn VEAR Inch, MD;  Location: [facility];  Service: Gynecology;  Laterality: N/A;  [name],CST-scrubbed; doctor scrubbed in @ [phone]   LAPAROSCOPIC LYSIS OF ADHESIONS  08/09/2012   Procedure: LAPAROSCOPIC LYSIS OF ADHESIONS;  Surgeon: Vonn VEAR Inch, MD;  Location: [facility];  Service: Gynecology;  Laterality: N/A;   LAPAROSCOPY  08/09/2012   Procedure: LAPAROSCOPY DIAGNOSTIC;  Surgeon: Vonn VEAR Inch, MD;  Location: [facility];  Service: Gynecology;  Laterality: N/A;  started at  1208-Vaginal trachelectomy (vaginal removal of cervical stump)    MYRINGOTOMY     SUPRACERVICAL ABDOMINAL HYSTERECTOMY  01/26/2012   Procedure: HYSTERECTOMY SUPRACERVICAL ABDOMINAL;  Surgeon: Vonn VEAR Inch, MD;  Location: [facility];  Service: Gynecology;;  converted 9150   TEE WITHOUT CARDIOVERSION N/A 07/16/2022   Procedure: TRANSESOPHAGEAL ECHOCARDIOGRAM (TEE);  Surgeon: Hobart Powell BRAVO, MD;  Location: [facility]COPY;  Service: Cardiovascular;  Laterality: N/A;    OB  History     Gravida  2   Para  2   Term  1   Preterm  1   AB      Living  2      SAB      IAB      Ectopic      Multiple      Live Births  1            Home Medications    Prior to Admission medications  Medication Sig Start Date End Date Taking? Authorizing Provider  diphenoxylate -atropine  (LOMOTIL ) 2.5-0.025 MG tablet Take 1 tablet by mouth  4 (four) times daily as needed for diarrhea or loose stools. Do not take more than 8 tablets in a 24 hr timeframe. 08/03/24  Yes Leath-Warren, Etta PARAS, NP  ondansetron  (ZOFRAN -ODT) 4 MG disintegrating tablet Take 1 tablet (4 mg total) by mouth every 8 (eight) hours as needed. 08/03/24  Yes Leath-Warren, Etta PARAS, NP  Ascorbic Acid (VITAMIN C PO) Take 2,000 mg by mouth daily. 06/01/22   [provider]  aspirin  81 MG chewable tablet Chew 1 tablet (81 mg total) by mouth daily. Patient taking differently: Chew 81 mg by mouth at bedtime. 01/01/22   Evonnie Lenis, MD  cloNIDine  (CATAPRES  - DOSED IN MG/24 HR) 0.1 mg/24hr patch PLACE 1 PATCH (0.1 MG TOTAL) ONTO THE SKIN ONCE A WEEK. 02/11/23   Fernande Elspeth BROCKS, MD  flecainide  (TAMBOCOR ) 50 MG tablet Take 1 tablet (50 mg total) by mouth 2 (two) times daily. 09/22/21   Fernande Elspeth BROCKS, MD  FLUoxetine  (PROZAC ) 40 MG capsule TAKE 1 CAPSULE (40 MG TOTAL) BY MOUTH DAILY. 09/17/22   Georjean Darice HERO, MD  fluticasone  (FLONASE ) 50 MCG/ACT nasal spray Place 2 sprays into both nostrils daily as needed for allergies. 08/25/22   [provider]  gabapentin  (NEURONTIN ) 600 MG tablet Take 600 mg by mouth 2 (two) times daily. 04/27/22   [provider]  lamoTRIgine  (LAMICTAL ) 25 MG tablet Take 150 mg by mouth 2 (two) times daily.    [provider]  loratadine  (CLARITIN ) 10 MG tablet Take 10 mg by mouth at bedtime. 06/01/22   [provider]  LORazepam  (ATIVAN ) 0.5 MG tablet Take 1 tablet once a day as needed for anxiety 01/12/22   Georjean Darice HERO, MD  Magnesium  Oxide 400 MG CAPS Take 1 capsule (400 mg total) by mouth daily. 08/21/21   Fernande Elspeth BROCKS, MD  montelukast  (SINGULAIR ) 10 MG tablet Take 1 tablet (10 mg total) by mouth at bedtime. 08/31/22   Wert, Michael B, MD  Multiple Vitamin (MULTIVITAMIN WITH MINERALS) TABS tablet Take 1 tablet by mouth every morning.    [provider]  OVER THE COUNTER MEDICATION Take 1 capsule by  mouth daily. Salt stick electrolyte chews    [provider]  simvastatin  (ZOCOR ) 10 MG tablet Take 10 mg by mouth at bedtime. 10/01/21   [provider]  SUMAtriptan  (IMITREX ) 50 MG tablet TAKE 2 TABLETS EVERY 2 HOURS AS NEEDED FOR MIGRANE. MAY REPEAT IN 2 HRS IF NEEDED - MAX 2 PER DAY Patient taking differently: Take 50-100 mg by mouth every 2 (two) hours as needed for migraine. 04/20/22   Joane Artist RAMAN, MD  topiramate  (TOPAMAX ) 100 MG tablet TAKE 1 TABLET BY MOUTH TWICE A DAY 08/11/22   Joane Artist RAMAN, MD    Family History Family History  Problem Relation Age of Onset   Breast cancer Mother  Ovarian cancer Mother    Breast cancer Paternal Aunt    Hypertension Brother    Heart disease Brother        hole in heart   Anesthesia problems Neg Hx    Hypotension Neg Hx    Pseudochol deficiency Neg Hx    Malignant hyperthermia Neg Hx    Other Neg Hx     Social History Social History[1]   Allergies   Neosporin [bacitracin-polymyxin b], Calan  [verapamil ], Coreg  [carvedilol ], Definity  [perflutren  lipid microsphere], Morphine  and codeine, Norco [hydrocodone-acetaminophen ], Beta adrenergic blockers, Bystolic  [nebivolol  hcl], Cardizem  cd [diltiazem  hcl er beads], Tenormin  [atenolol ], and Toprol  xl [metoprolol ]   Review of Systems Review of Systems Per HPI  Physical Exam Triage Vital Signs ED Triage Vitals [08/03/24 1712]  Encounter Vitals Group     BP (!) 145/96     Girls Systolic BP Percentile      Girls Diastolic BP Percentile      Boys Systolic BP Percentile      Boys Diastolic BP Percentile      Pulse Rate 84     Resp 20     Temp 98.5 F (36.9 C)     Temp Source Oral     SpO2 96 %     Weight      Height      Head Circumference      Peak Flow      Pain Score      Pain Loc      Pain Education      Exclude from Growth Chart    No data found.  Updated Vital Signs BP (!) 145/96 (BP Location: Right Arm)   Pulse 84   Temp 98.5 F (36.9 C) (Oral)    Resp 20   LMP 05/04/2011   SpO2 96%   Visual Acuity Right Eye Distance:   Left Eye Distance:   Bilateral Distance:    Right Eye Near:   Left Eye Near:    Bilateral Near:     Physical Exam Vitals and nursing note reviewed.  Constitutional:      General: She is not in acute distress.    Appearance: Normal appearance.  HENT:     Head: Normocephalic.  Eyes:     Extraocular Movements: Extraocular movements intact.     Conjunctiva/sclera: Conjunctivae normal.     Pupils: Pupils are equal, round, and reactive to light.  Cardiovascular:     Rate and Rhythm: Normal rate and regular rhythm.     Pulses: Normal pulses.     Heart sounds: Normal heart sounds.  Pulmonary:     Effort: Pulmonary effort is normal. No respiratory distress.     Breath sounds: Normal breath sounds. No stridor. No wheezing, rhonchi or rales.  Abdominal:     General: Bowel sounds are normal.     Palpations: Abdomen is soft.     Tenderness: There is no abdominal tenderness.  Musculoskeletal:     Cervical back: Normal range of motion.  Neurological:     General: No focal deficit present.     Mental Status: She is alert and oriented to person, place, and time.  Psychiatric:        Mood and Affect: Mood normal.        Behavior: Behavior normal.      UC Treatments / Results  Labs (all labs ordered are listed, but only abnormal results are displayed) Labs Reviewed  POCT URINE DIPSTICK - Abnormal; Notable for the following components:  Result Value   Clarity, UA cloudy (*)    Spec Grav, UA >=1.030 (*)    All other components within normal limits  CBC WITH DIFFERENTIAL/PLATELET  COMPREHENSIVE METABOLIC PANEL WITH GFR    EKG   Radiology No results found.  Procedures Procedures (including critical care time)  Medications Ordered in UC Medications - No data to display  Initial Impression / Assessment and Plan / UC Course  I have reviewed the triage vital signs and the nursing  notes.  Pertinent labs & imaging results that were available during my care of the patient were reviewed by me and considered in my medical decision making (see chart for details).  On exam, the patient is well-appearing, she is in no acute distress, vital signs are stable.  She does not exhibit any abdominal tenderness on exam.  Urinalysis does not indicate signs of infection, does show elevated specific gravity.  CBC and CMP are pending for safety.  Symptoms not consistent with acute abdomen.  Symptoms most likely of viral etiology; will provide symptomatic treatment with Lomotil  for severe diarrhea and ondansetron  4 mg ODT for nausea.  Patient advised to begin Imodium A-D at home, patient advised to begin Lomotil  if symptoms fail to improve.  Supportive care recommendations were provided and discussed with the patient to include over-the-counter Tylenol , Gatorade or Pedialyte, and diet to help decrease diarrhea symptoms.  Discussed strict ER follow-up precautions with the patient.  Patient was in agreement with this plan of care and verbalizes understanding.  All questions were answered.  Patient stable for discharge.  Final Clinical Impressions(s) / UC Diagnoses   Final diagnoses:  Diarrhea, unspecified type  Abdominal pain, unspecified abdominal location     Discharge Instructions      Lab results are pending.  You will be contacted if the pending test results are abnormal.  You will also have access to results via MyChart. Take medication as prescribed.  Begin taking Imodium first, if symptoms do not improve with Imodium, begin this prescription. You may take over-the-counter Tylenol  as needed for pain, fever, or general discomfort. Increase your fluid intake.  Recommend Pedialyte or Gatorade to prevent further dehydration. Recommend diet to increase the bulk in your stool to help decrease diarrhea symptoms.  I have given you information that you can refer to.  Recommend a BRAT diet for  nausea to include bananas, rice, applesauce, or toast. Go to the emergency department if you experience worsening diarrhea, nausea, vomiting, abdominal pain, or fever returns. Follow-up as needed.     ED Prescriptions     Medication Sig Dispense Auth. Provider   diphenoxylate -atropine  (LOMOTIL ) 2.5-0.025 MG tablet Take 1 tablet by mouth 4 (four) times daily as needed for diarrhea or loose stools. Do not take more than 8 tablets in a 24 hr timeframe. 30 tablet Leath-Warren, Etta PARAS, NP   ondansetron  (ZOFRAN -ODT) 4 MG disintegrating tablet Take 1 tablet (4 mg total) by mouth every 8 (eight) hours as needed. 20 tablet Leath-Warren, Etta PARAS, NP      I have reviewed the PDMP during this encounter.     [1]  Social History Tobacco Use   Smoking status: Former    Current packs/day: 0.00    Types: Cigarettes    Start date: 10/01/2001    Quit date: 09/01/2009    Years since quitting: 14.9    Passive exposure: Never   Smokeless tobacco: Never  Vaping Use   Vaping status: Never Used  Substance Use Topics  Alcohol use: No    Alcohol/week: 0.0 standard drinks of alcohol   Drug use: No     Gilmer Etta PARAS, NP 08/03/24 1901  "

## 2024-08-03 NOTE — Discharge Instructions (Addendum)
 Lab results are pending.  You will be contacted if the pending test results are abnormal.  You will also have access to results via MyChart. Take medication as prescribed.  Begin taking Imodium first, if symptoms do not improve with Imodium, begin this prescription. You may take over-the-counter Tylenol  as needed for pain, fever, or general discomfort. Increase your fluid intake.  Recommend Pedialyte or Gatorade to prevent further dehydration. Recommend diet to increase the bulk in your stool to help decrease diarrhea symptoms.  I have given you information that you can refer to.  Recommend a BRAT diet for nausea to include bananas, rice, applesauce, or toast. Go to the emergency department if you experience worsening diarrhea, nausea, vomiting, abdominal pain, or fever returns. Follow-up as needed.

## 2024-08-04 LAB — CBC WITH DIFFERENTIAL/PLATELET
Basophils Absolute: 0 x10E3/uL (ref 0.0–0.2)
Basos: 1 %
EOS (ABSOLUTE): 0.5 x10E3/uL — ABNORMAL HIGH (ref 0.0–0.4)
Eos: 7 %
Hematocrit: 44.8 % (ref 34.0–46.6)
Hemoglobin: 15.2 g/dL (ref 11.1–15.9)
Immature Grans (Abs): 0 x10E3/uL (ref 0.0–0.1)
Immature Granulocytes: 0 %
Lymphocytes Absolute: 2.8 x10E3/uL (ref 0.7–3.1)
Lymphs: 39 %
MCH: 31.4 pg (ref 26.6–33.0)
MCHC: 33.9 g/dL (ref 31.5–35.7)
MCV: 93 fL (ref 79–97)
Monocytes Absolute: 0.6 x10E3/uL (ref 0.1–0.9)
Monocytes: 8 %
Neutrophils Absolute: 3.3 x10E3/uL (ref 1.4–7.0)
Neutrophils: 45 %
Platelets: 230 x10E3/uL (ref 150–450)
RBC: 4.84 x10E6/uL (ref 3.77–5.28)
RDW: 12.9 % (ref 11.7–15.4)
WBC: 7.2 x10E3/uL (ref 3.4–10.8)

## 2024-08-04 LAB — COMPREHENSIVE METABOLIC PANEL WITH GFR
ALT: 9 IU/L (ref 0–32)
AST: 14 IU/L (ref 0–40)
Albumin: 4.3 g/dL (ref 3.9–4.9)
Alkaline Phosphatase: 60 IU/L (ref 41–116)
BUN/Creatinine Ratio: 19 (ref 9–23)
BUN: 15 mg/dL (ref 6–24)
Bilirubin Total: 0.3 mg/dL (ref 0.0–1.2)
CO2: 23 mmol/L (ref 20–29)
Calcium: 9.7 mg/dL (ref 8.7–10.2)
Chloride: 100 mmol/L (ref 96–106)
Creatinine, Ser: 0.79 mg/dL (ref 0.57–1.00)
Globulin, Total: 2.5 g/dL (ref 1.5–4.5)
Glucose: 89 mg/dL (ref 70–99)
Potassium: 4.3 mmol/L (ref 3.5–5.2)
Sodium: 139 mmol/L (ref 134–144)
Total Protein: 6.8 g/dL (ref 6.0–8.5)
eGFR: 97 mL/min/1.73

## 2024-08-06 ENCOUNTER — Ambulatory Visit (HOSPITAL_COMMUNITY): Payer: Self-pay

## 2024-09-24 ENCOUNTER — Ambulatory Visit

## 2024-09-27 ENCOUNTER — Ambulatory Visit
